# Patient Record
Sex: Female | Born: 1965 | Race: White | Hispanic: No | Marital: Married | Smoking: Never smoker
Health system: Southern US, Community
[De-identification: ages and names within clinical notes are randomized; demographics above are authoritative.]

## PROBLEM LIST (undated history)

## (undated) DIAGNOSIS — I10 Essential (primary) hypertension: Secondary | ICD-10-CM

## (undated) DIAGNOSIS — Z8619 Personal history of other infectious and parasitic diseases: Secondary | ICD-10-CM

## (undated) DIAGNOSIS — E119 Type 2 diabetes mellitus without complications: Secondary | ICD-10-CM

## (undated) DIAGNOSIS — M1990 Unspecified osteoarthritis, unspecified site: Secondary | ICD-10-CM

## (undated) DIAGNOSIS — F319 Bipolar disorder, unspecified: Secondary | ICD-10-CM

## (undated) DIAGNOSIS — E785 Hyperlipidemia, unspecified: Secondary | ICD-10-CM

## (undated) DIAGNOSIS — N183 Chronic kidney disease, stage 3 unspecified: Secondary | ICD-10-CM

## (undated) DIAGNOSIS — I2510 Atherosclerotic heart disease of native coronary artery without angina pectoris: Secondary | ICD-10-CM

## (undated) HISTORY — DX: Chronic kidney disease, stage 3 unspecified: N18.30

## (undated) HISTORY — DX: Personal history of other infectious and parasitic diseases: Z86.19

## (undated) HISTORY — DX: Essential (primary) hypertension: I10

## (undated) HISTORY — DX: Hyperlipidemia, unspecified: E78.5

## (undated) HISTORY — DX: Atherosclerotic heart disease of native coronary artery without angina pectoris: I25.10

---

## 2002-04-12 HISTORY — PX: OTHER SURGICAL HISTORY: SHX169

## 2006-04-12 HISTORY — PX: OTHER SURGICAL HISTORY: SHX169

## 2014-05-10 LAB — HEPATIC FUNCTION PANEL
ALT: 40 U/L — AB (ref 7–35)
AST: 37 U/L — AB (ref 13–35)

## 2014-05-10 LAB — BASIC METABOLIC PANEL
BUN: 5 mg/dL (ref 4–21)
Creatinine: 0.9 mg/dL (ref 0.5–1.1)
Glucose: 154 mg/dL
POTASSIUM: 3.7 mmol/L (ref 3.4–5.3)
Sodium: 138 mmol/L (ref 137–147)

## 2014-05-16 ENCOUNTER — Ambulatory Visit: Payer: Self-pay | Admitting: Family Medicine

## 2014-10-09 ENCOUNTER — Other Ambulatory Visit: Payer: Self-pay | Admitting: Family Medicine

## 2014-11-25 ENCOUNTER — Other Ambulatory Visit: Payer: Self-pay | Admitting: Family Medicine

## 2015-03-14 ENCOUNTER — Other Ambulatory Visit: Payer: Self-pay | Admitting: Family Medicine

## 2015-07-08 ENCOUNTER — Ambulatory Visit: Payer: Self-pay | Admitting: Family Medicine

## 2015-07-08 DIAGNOSIS — M109 Gout, unspecified: Secondary | ICD-10-CM | POA: Insufficient documentation

## 2015-07-08 DIAGNOSIS — R109 Unspecified abdominal pain: Secondary | ICD-10-CM

## 2015-07-08 DIAGNOSIS — M1990 Unspecified osteoarthritis, unspecified site: Secondary | ICD-10-CM | POA: Insufficient documentation

## 2015-07-08 DIAGNOSIS — F319 Bipolar disorder, unspecified: Secondary | ICD-10-CM | POA: Insufficient documentation

## 2015-07-08 DIAGNOSIS — M179 Osteoarthritis of knee, unspecified: Secondary | ICD-10-CM | POA: Insufficient documentation

## 2015-07-08 DIAGNOSIS — G8929 Other chronic pain: Secondary | ICD-10-CM | POA: Insufficient documentation

## 2015-07-08 DIAGNOSIS — M1710 Unilateral primary osteoarthritis, unspecified knee: Secondary | ICD-10-CM | POA: Insufficient documentation

## 2015-07-10 ENCOUNTER — Ambulatory Visit (INDEPENDENT_AMBULATORY_CARE_PROVIDER_SITE_OTHER): Payer: Self-pay | Admitting: Family Medicine

## 2015-07-10 ENCOUNTER — Encounter: Payer: Self-pay | Admitting: Family Medicine

## 2015-07-10 VITALS — BP 138/90 | HR 80 | Temp 98.1°F | Resp 16 | Ht 63.0 in | Wt 211.0 lb

## 2015-07-10 DIAGNOSIS — M1009 Idiopathic gout, multiple sites: Secondary | ICD-10-CM

## 2015-07-10 DIAGNOSIS — M109 Gout, unspecified: Secondary | ICD-10-CM

## 2015-07-10 DIAGNOSIS — R197 Diarrhea, unspecified: Secondary | ICD-10-CM

## 2015-07-10 DIAGNOSIS — F3178 Bipolar disorder, in full remission, most recent episode mixed: Secondary | ICD-10-CM

## 2015-07-10 DIAGNOSIS — R195 Other fecal abnormalities: Secondary | ICD-10-CM | POA: Insufficient documentation

## 2015-07-10 MED ORDER — DICYCLOMINE HCL 10 MG PO CAPS: 10.0000 mg | ORAL_CAPSULE | Freq: Three times a day (TID) | ORAL | Status: DC

## 2015-07-10 MED ORDER — LITHIUM CARBONATE 300 MG PO CAPS: 1200.0000 mg | ORAL_CAPSULE | Freq: Every day | ORAL | Status: DC

## 2015-07-10 MED ORDER — ALLOPURINOL 100 MG PO TABS: 100.0000 mg | ORAL_TABLET | Freq: Every day | ORAL | Status: DC

## 2015-07-10 NOTE — Progress Notes (Signed)
Patient: Kim Obrien Female    DOB: April 22, 1965   50 y.o.   MRN: [id number] Visit Date: 07/10/2015  Today's Provider: Lelon Huh, MD   Chief Complaint  Patient presents with  . Gout  . Osteoarthritis  . Manic Behavior  . Diarrhea   Subjective:    HPI    Gout: Patient here for evaluation of acute gouty arthritis. The patient reports attack every 2-3 months. Attacks occur primarily in the right foot. The patient is avoiding high purine foods and reports consuming no alcoholic beverages. Indomethacin works well.    Bipolar Disorder: Pt currently takes lithium carbonate 300 mg 3-4 capsules qhs, with relief.  Diarrhea: Patient complains of diarrhea. Symptoms have been present for approximately 2 years. The symptoms are unchanged. Stool frequency is approximately 4 per day. Patient estimates stool volume to be "not much". Diarrhea does not occur at night. The patient has noted occasional balck stools. The also patient reports the following symptoms: abdominal distension, fecal urgency, flatulence, loose stools, melena and small volume diarrhea. The patient denies the following symptoms: cramping unrelieved by defecation, fever and red blood with stools. The patient currently denies significant abdominal pain or discomfort.   Relationship to food: the diarrhea improves with fasting. Has tried cutting out dairy with no improvement. She did have abdominal ultrasound last year due to abdominal bloating, which was remarkable only for fatty liver.    No Known Allergies Previous Medications   ALLOPURINOL (ZYLOPRIM) 100 MG TABLET    Take 1 tablet by mouth daily.   DIPHENHYDRAMINE-ACETAMINOPHEN (TYLENOL PM EXTRA STRENGTH) 25-500 MG TABS TABLET    Take 1 tablet by mouth at bedtime as needed.   INDOMETHACIN (INDOCIN) 50 MG CAPSULE    TAKE ONE CAPSULE BY MOUTH EVERY 8 HOURS AS NEEDED   LITHIUM CARBONATE 300 MG CAPSULE    TAKE 3-4 CAPSULES ORAL AT BEDTIME   MULTIPLE VITAMINS-MINERALS (WOMENS  MULTIVITAMIN PLUS) TABS    Take 1 tablet by mouth daily.    Review of Systems  Constitutional: Positive for fatigue. Negative for fever, chills, diaphoresis, activity change, appetite change and unexpected weight change.  Respiratory: Negative for cough, shortness of breath and wheezing.   Cardiovascular: Negative for chest pain and palpitations.  Gastrointestinal: Positive for nausea, diarrhea and abdominal distention. Negative for abdominal pain.    Social History  Substance Use Topics  . Smoking status: Never Smoker   . Smokeless tobacco: Never Used  . Alcohol Use: No   Objective:   BP 138/90 mmHg  Pulse 80  Temp(Src) 98.1 F (36.7 C) (Oral)  Resp 16  Ht 5\' 3"  (1.6 m)  Wt 211 lb (95.709 kg)  BMI 37.39 kg/m2  LMP 06/26/2015  Physical Exam  General Appearance:    Alert, cooperative, no distress, obese  Eyes:    PERRL, conjunctiva/corneas clear, EOM's intact       Lungs:     Clear to auscultation bilaterally, respirations unlabored  Heart:    Regular rate and rhythm  Neurologic:   Awake, alert, oriented x 3. No apparent focal neurological           defect.   Psych:   Appropriate mood and affect.       Assessment & Plan:     1. Bipolar disorder, in full remission, most recent episode mixed ([place]) Symptomatically well controlled. - lithium carbonate 300 MG capsule; Take 4 capsules (1,200 mg total) by mouth at bedtime.  Dispense: 120 capsule; Refill:  3 - Lithium level - Renal function panel - TSH  2. Gouty arthritis May increase to 2 a day at her discretion since she is still having flare ups every few months.  - allopurinol (ZYLOPRIM) 100 MG tablet; Take 1-2 tablets (100-200 mg total) by mouth daily.  Dispense: 30 tablet; Refill: 12  3. Loose bowel movement Try gluten free diet. Suspect IBS. Try Bentyl.  - dicyclomine (BENTYL) 10 MG capsule; Take 1-2 capsules (10-20 mg total) by mouth 4 (four) times daily -  before meals and at bedtime.  Dispense: 120 capsule;  Refill: 3       Lelon Huh, MD  [facility]

## 2015-07-14 DIAGNOSIS — Z0000 Encounter for general adult medical examination without abnormal findings: Secondary | ICD-10-CM

## 2015-07-15 LAB — TSH: TSH: 2.22 u[IU]/mL (ref 0.450–4.500)

## 2015-07-15 LAB — RENAL FUNCTION PANEL
Albumin: 3.8 g/dL (ref 3.5–5.5)
BUN / CREAT RATIO: 6 — AB (ref 9–23)
BUN: 6 mg/dL (ref 6–24)
CHLORIDE: 109 mmol/L — AB (ref 96–106)
CO2: 18 mmol/L (ref 18–29)
Calcium: 9 mg/dL (ref 8.7–10.2)
Creatinine, Ser: 1.07 mg/dL — ABNORMAL HIGH (ref 0.57–1.00)
GFR, EST AFRICAN AMERICAN: 70 mL/min/{1.73_m2} (ref >59)
GFR, EST NON AFRICAN AMERICAN: 61 mL/min/{1.73_m2} (ref >59)
GLUCOSE: 132 mg/dL — AB (ref 65–99)
POTASSIUM: 4.1 mmol/L (ref 3.5–5.2)
Phosphorus: 2.6 mg/dL (ref 2.5–4.5)
Sodium: 143 mmol/L (ref 134–144)

## 2015-07-15 LAB — LITHIUM LEVEL: LITHIUM LVL: 1 mmol/L (ref 0.6–1.2)

## 2015-11-03 ENCOUNTER — Other Ambulatory Visit: Payer: Self-pay | Admitting: Family Medicine

## 2015-11-03 DIAGNOSIS — F3178 Bipolar disorder, in full remission, most recent episode mixed: Secondary | ICD-10-CM

## 2016-02-03 ENCOUNTER — Other Ambulatory Visit: Payer: Self-pay | Admitting: Family Medicine

## 2016-03-16 ENCOUNTER — Other Ambulatory Visit: Payer: Self-pay | Admitting: Family Medicine

## 2016-03-16 DIAGNOSIS — F3178 Bipolar disorder, in full remission, most recent episode mixed: Secondary | ICD-10-CM

## 2016-04-08 ENCOUNTER — Other Ambulatory Visit: Payer: Self-pay | Admitting: Family Medicine

## 2016-04-08 DIAGNOSIS — M109 Gout, unspecified: Secondary | ICD-10-CM

## 2016-09-03 ENCOUNTER — Encounter: Payer: Self-pay | Admitting: Physician Assistant

## 2016-09-03 ENCOUNTER — Ambulatory Visit (INDEPENDENT_AMBULATORY_CARE_PROVIDER_SITE_OTHER): Payer: Self-pay | Admitting: Physician Assistant

## 2016-09-03 VITALS — BP 122/88 | HR 84 | Temp 99.3°F | Resp 16 | Wt 207.0 lb

## 2016-09-03 DIAGNOSIS — R109 Unspecified abdominal pain: Secondary | ICD-10-CM

## 2016-09-03 DIAGNOSIS — R112 Nausea with vomiting, unspecified: Secondary | ICD-10-CM

## 2016-09-03 DIAGNOSIS — R140 Abdominal distension (gaseous): Secondary | ICD-10-CM

## 2016-09-03 DIAGNOSIS — Z0000 Encounter for general adult medical examination without abnormal findings: Secondary | ICD-10-CM

## 2016-09-03 DIAGNOSIS — F3178 Bipolar disorder, in full remission, most recent episode mixed: Secondary | ICD-10-CM

## 2016-09-03 MED ORDER — ONDANSETRON HCL 4 MG PO TABS: 4.0000 mg | ORAL_TABLET | Freq: Three times a day (TID) | ORAL | 0 refills | Status: DC | PRN

## 2016-09-03 NOTE — Progress Notes (Signed)
Patient: Kim Obrien Female    DOB: 1965/04/18   51 y.o.   MRN: [id number] Visit Date: 09/03/2016  Today's Provider: Trinna Post, PA-C   Chief Complaint  Patient presents with  . Emesis   Subjective:    Emesis   This is a new problem. Episode onset: x 5 days. Episode frequency: Occurs for 3-4 hours every night. The problem has been unchanged. The emesis has an appearance of stomach contents. Maximum temperature: pt does not believe her thermometer is accurate; her temperatures have been reading around 97.0. Temp in office is 99.3. Associated symptoms include arthralgias (baseline), diarrhea (some), dizziness (some) and headaches. Pertinent negatives include no abdominal pain, chest pain, chills, coughing, myalgias, sweats or weight loss. Treatments tried: Zofran. The treatment provided moderate relief.   Kim Obrien is a 51 y/o woman with history of Bipolar disorder on lithium, chronic diarrhea and abdominal bloating presenting today with vomiting at night. She doesn't vomit during the day. No bloody vomit or stools. No association with food she says. She eats fried rice and Chick fil A before bed. No alcohol use or new medications. Also has chronic abdominal bloating and diarrhea. Has been recommended to go to GI previously  But doesn't have insurance and so hasn't. She had an abdominal ultrasound last year which showed fatty liver but otherwise normal. She Was prescribed Bentyl but she felt it didn't work so she stopped taking it.  Also asking for refill on lithium.     No Known Allergies   Current Outpatient Prescriptions:  .  allopurinol (ZYLOPRIM) 100 MG tablet, TAKE 1 TABLET BY MOUTH DAILY, Disp: 30 tablet, Rfl: 5 .  diphenhydramine-acetaminophen (TYLENOL PM EXTRA STRENGTH) 25-500 MG TABS tablet, Take 1 tablet by mouth at bedtime as needed. , Disp: , Rfl:  .  indomethacin (INDOCIN) 50 MG capsule, TAKE ONE CAPSULE BY MOUTH EVERY 8 HOURS AS NEEDED, Disp: 30 capsule,  Rfl: 5 .  lithium carbonate 300 MG capsule, TAKE 4 CAPSULES (1,200 MG TOTAL) BY MOUTH AT BEDTIME., Disp: 120 capsule, Rfl: 5  Review of Systems  Constitutional: Negative for chills and weight loss.  Respiratory: Negative for cough.   Cardiovascular: Negative for chest pain.  Gastrointestinal: Positive for diarrhea (some) and vomiting. Negative for abdominal pain.  Musculoskeletal: Positive for arthralgias (baseline). Negative for myalgias.  Neurological: Positive for dizziness (some) and headaches.    Social History  Substance Use Topics  . Smoking status: Never Smoker  . Smokeless tobacco: Never Used  . Alcohol use No   Objective:   BP 122/88 (BP Location: Left Arm, Patient Position: Sitting, Cuff Size: Large)   Pulse 84   Temp 99.3 F (37.4 C) (Oral)   Resp 16   Wt 207 lb (93.9 kg)   LMP 08/22/2016   BMI 36.67 kg/m  Vitals:   09/03/16 0844  BP: 122/88  Pulse: 84  Resp: 16  Temp: 99.3 F (37.4 C)  TempSrc: Oral  Weight: 207 lb (93.9 kg)     Physical Exam  Constitutional: She is oriented to person, place, and time. She appears well-developed and well-nourished.  Cardiovascular: Normal rate and regular rhythm.   Pulmonary/Chest: Breath sounds normal. She is in respiratory distress.  Abdominal: Soft. Bowel sounds are normal. She exhibits distension. She exhibits no mass. There is no tenderness. There is no rebound and no guarding.  Neurological: She is alert and oriented to person, place, and time.  Skin: Skin is warm and  dry.  Psychiatric: Her behavior is normal. Her affect is blunt.        Assessment & Plan:     1. Nausea and vomiting, intractability of vomiting not specified, unspecified vomiting type  Possibly infectious superimposed onto acid reflux but will work up below. Not getting H pylori test 2/2 cost. Have instructed her to continue Zofran. She really needs to be seen by GI and we will make that referral when she has insurance.  - Comprehensive  metabolic panel - Lipase - Amylase - ondansetron (ZOFRAN) 4 MG tablet; Take 1 tablet (4 mg total) by mouth every 8 (eight) hours as needed for nausea or vomiting.  Dispense: 20 tablet; Refill: 0  2. Abdominal bloating  - Comprehensive metabolic panel - Lipase - Amylase  3. Abdominal pain, unspecified abdominal location  - Comprehensive metabolic panel - Lipase - Amylase  4. Bipolar disorder, in full remission, most recent episode mixed ([place])  - Lithium level  No Follow-up on file.  The entirety of the information documented in the History of Present Illness, Review of Systems and Physical Exam were personally obtained by me. Portions of this information were initially documented by Raquel Sarna D. and reviewed by me for thoroughness and accuracy.           Trinna Post, PA-C  [facility]

## 2016-09-03 NOTE — Patient Instructions (Signed)
Nausea and Vomiting, Adult Feeling sick to your stomach (nausea) means that your stomach is upset or you feel like you have to throw up (vomit). Feeling more and more sick to your stomach can lead to throwing up. Throwing up happens when food and liquid from your stomach are thrown up and out the mouth. Throwing up can make you feel weak and cause you to get dehydrated. Dehydration can make you tired and thirsty, make you have a dry mouth, and make it so you pee (urinate) less often. Older adults and people with other diseases or a weak defense system (immune system) are at higher risk for dehydration. If you feel sick to your stomach or if you throw up, it is important to follow instructions from your doctor about how to take care of yourself. Follow these instructions at home: Eating and drinking  Follow these instructions as told by your doctor:  Take an oral rehydration solution (ORS). This is a drink that is sold at pharmacies and stores.  Drink clear fluids in small amounts as you are able, such as:  Water.  Ice chips.  Diluted fruit juice.  Low-calorie sports drinks.  Eat bland, easy-to-digest foods in small amounts as you are able, such as:  Bananas.  Applesauce.  Rice.  Low-fat (lean) meats.  Toast.  Crackers.  Avoid fluids that have a lot of sugar or caffeine in them.  Avoid alcohol.  Avoid spicy or fatty foods. General instructions   Drink enough fluid to keep your pee (urine) clear or pale yellow.  Wash your hands often. If you cannot use soap and water, use hand sanitizer.  Make sure that all people in your home wash their hands well and often.  Take over-the-counter and prescription medicines only as told by your doctor.  Rest at home while you get better.  Watch your condition for any changes.  Breathe slowly and deeply when you feel sick to your stomach.  Keep all follow-up visits as told by your doctor. This is important. Contact a doctor  if:  You have a fever.  You cannot keep fluids down.  Your symptoms get worse.  You have new symptoms.  You feel sick to your stomach for more than two days.  You feel light-headed or dizzy.  You have a headache.  You have muscle cramps. Get help right away if:  You have pain in your chest, neck, arm, or jaw.  You feel very weak or you pass out (faint).  You throw up again and again.  You see blood in your throw-up.  Your throw-up looks like black coffee grounds.  You have bloody or black poop (stools) or poop that look like tar.  You have a very bad headache, a stiff neck, or both.  You have a rash.  You have very bad pain, cramping, or bloating in your belly (abdomen).  You have trouble breathing.  You are breathing very quickly.  Your heart is beating very quickly.  Your skin feels cold and clammy.  You feel confused.  You have pain when you pee.  You have signs of dehydration, such as:  Dark pee, hardly any pee, or no pee.  Cracked lips.  Dry mouth.  Sunken eyes.  Sleepiness.  Weakness. These symptoms may be an emergency. Do not wait to see if the symptoms will go away. Get medical help right away. Call your local emergency services (911 in the [place] Do not drive yourself to the hospital. This information   is not intended to replace advice given to you by your health care provider. Make sure you discuss any questions you have with your health care provider. Document Released: 09/15/2007 Document Revised: 10/17/2015 Document Reviewed: 12/03/2014 Elsevier Interactive Patient Education  2017 [employer]vier Inc[employer]  

## 2016-09-04 LAB — COMPREHENSIVE METABOLIC PANEL
ALT: 18 IU/L (ref 0–32)
AST: 17 IU/L (ref 0–40)
Albumin/Globulin Ratio: 1.4 (ref 1.2–2.2)
Albumin: 4 g/dL (ref 3.5–5.5)
Alkaline Phosphatase: 74 IU/L (ref 39–117)
BUN/Creatinine Ratio: 6 — ABNORMAL LOW (ref 9–23)
BUN: 7 mg/dL (ref 6–24)
Bilirubin Total: 0.3 mg/dL (ref 0.0–1.2)
CO2: 20 mmol/L (ref 18–29)
Calcium: 9.2 mg/dL (ref 8.7–10.2)
Chloride: 108 mmol/L — ABNORMAL HIGH (ref 96–106)
Creatinine, Ser: 1.14 mg/dL — ABNORMAL HIGH (ref 0.57–1.00)
GFR calc Af Amer: 65 mL/min/{1.73_m2} (ref >59)
GFR calc non Af Amer: 56 mL/min/{1.73_m2} — ABNORMAL LOW (ref >59)
Globulin, Total: 2.8 g/dL (ref 1.5–4.5)
Glucose: 120 mg/dL — ABNORMAL HIGH (ref 65–99)
Potassium: 3.8 mmol/L (ref 3.5–5.2)
Sodium: 140 mmol/L (ref 134–144)
Total Protein: 6.8 g/dL (ref 6.0–8.5)

## 2016-09-04 LAB — AMYLASE: Amylase: 58 U/L (ref 31–124)

## 2016-09-04 LAB — LIPASE: Lipase: 35 U/L (ref 14–72)

## 2016-09-04 LAB — LITHIUM LEVEL: Lithium Lvl: 0.8 mmol/L (ref 0.6–1.2)

## 2016-09-07 ENCOUNTER — Telehealth: Payer: Self-pay

## 2016-09-07 NOTE — Telephone Encounter (Signed)
Pt advised.  She does not want to proceed with the GI referral at this time, secondary to not having health insurance.  I advised her to call back when she is able to go forward with the referral.   Thanks,   -Mickel Baas

## 2016-09-07 NOTE — Telephone Encounter (Signed)
-----   Message from Trinna Post, [place] sent at 09/07/2016  8:34 AM EDT ----- Lipase and amylase normal. CMP shows some likely dehydration changes, a little high sugar. Really needs to see GI if this continues. Please let us know when to place the referral. Lithium level normal and we can refill as request comes in. Is she feeling any better today?

## 2016-09-07 NOTE — Telephone Encounter (Signed)
Noted, thank you

## 2016-09-07 NOTE — Telephone Encounter (Signed)
LMTCB 09/07/2016  Thanks,   -Mickel Baas

## 2016-09-13 ENCOUNTER — Other Ambulatory Visit: Payer: Self-pay | Admitting: Family Medicine

## 2016-09-13 DIAGNOSIS — F3178 Bipolar disorder, in full remission, most recent episode mixed: Secondary | ICD-10-CM

## 2016-10-25 ENCOUNTER — Other Ambulatory Visit: Payer: Self-pay | Admitting: Family Medicine

## 2016-10-25 DIAGNOSIS — M109 Gout, unspecified: Secondary | ICD-10-CM

## 2017-02-13 ENCOUNTER — Other Ambulatory Visit: Payer: Self-pay | Admitting: Family Medicine

## 2017-02-13 DIAGNOSIS — M109 Gout, unspecified: Secondary | ICD-10-CM

## 2017-03-14 ENCOUNTER — Other Ambulatory Visit: Payer: Self-pay | Admitting: Family Medicine

## 2017-03-14 DIAGNOSIS — F3178 Bipolar disorder, in full remission, most recent episode mixed: Secondary | ICD-10-CM

## 2017-05-31 ENCOUNTER — Encounter: Payer: Self-pay | Admitting: Family Medicine

## 2017-05-31 ENCOUNTER — Ambulatory Visit (INDEPENDENT_AMBULATORY_CARE_PROVIDER_SITE_OTHER): Payer: Self-pay | Admitting: Family Medicine

## 2017-05-31 VITALS — BP 130/84 | HR 77 | Temp 98.2°F | Resp 16 | Wt 217.0 lb

## 2017-05-31 DIAGNOSIS — S161XXS Strain of muscle, fascia and tendon at neck level, sequela: Secondary | ICD-10-CM

## 2017-05-31 MED ORDER — CYCLOBENZAPRINE HCL 5 MG PO TABS: 5.0000 mg | ORAL_TABLET | Freq: Three times a day (TID) | ORAL | 1 refills | Status: DC | PRN

## 2017-05-31 NOTE — Progress Notes (Signed)
Patient: Kim Obrien Female    DOB: 1965-04-18   52 y.o.   MRN: [id number] Visit Date: 05/31/2017  Today's Provider: Lelon Huh, MD   Chief Complaint  Patient presents with  . Neck Pain   Subjective:    Patient has had right side neck pain for 3 weeks. Patient states she did not injure herself, she just woke-up one morning with her neck hurting. Patient describes pain as shooting. Patient states yesterday the pain in her neck moved down into her right shoulder. Patient states pain in shoulder is sharp. Patient has been taking otc ibuprofen, Aleve, Bengay cream, Deep Blue cream and cyclobenzaprine with mild relief.     Neck Pain   This is a new problem. The current episode started 1 to 4 weeks ago (3 weeks). The problem occurs constantly. The problem has been gradually worsening. The pain is associated with an unknown factor. The pain is present in the right side. The quality of the pain is described as shooting. The pain is at a severity of 8/10. The pain is moderate. The symptoms are aggravated by sneezing, coughing, position and twisting. The pain is same all the time. Stiffness is present all day. Pertinent negatives include no chest pain, fever, headaches, leg pain, numbness, pain with swallowing, paresis, photophobia, syncope, tingling, trouble swallowing, visual change, weakness or weight loss. She has tried NSAIDs (ibuprofen, Aleve and muscle relaxer) for the symptoms. The treatment provided mild relief.       No Known Allergies   Current Outpatient Medications:  .  allopurinol (ZYLOPRIM) 100 MG tablet, TAKE 1 TABLET BY MOUTH DAILY, Disp: 30 tablet, Rfl: 5 .  diphenhydramine-acetaminophen (TYLENOL PM EXTRA STRENGTH) 25-500 MG TABS tablet, Take 1 tablet by mouth at bedtime as needed. , Disp: , Rfl:  .  indomethacin (INDOCIN) 50 MG capsule, TAKE ONE CAPSULE BY MOUTH EVERY 8 HOURS AS NEEDED, Disp: 30 capsule, Rfl: 5 .  lithium carbonate 300 MG capsule, TAKE 4 CAPSULES  (1,200 MG TOTAL) BY MOUTH AT BEDTIME., Disp: 120 capsule, Rfl: 5 .  ondansetron (ZOFRAN) 4 MG tablet, Take 1 tablet (4 mg total) by mouth every 8 (eight) hours as needed for nausea or vomiting., Disp: 20 tablet, Rfl: 0  Review of Systems  Constitutional: Negative for appetite change, chills, fatigue, fever and weight loss.  HENT: Negative for trouble swallowing.   Eyes: Negative for photophobia.  Respiratory: Negative for chest tightness and shortness of breath.   Cardiovascular: Negative for chest pain, palpitations and syncope.  Gastrointestinal: Negative for abdominal pain, nausea and vomiting.  Musculoskeletal: Positive for neck pain.  Neurological: Negative for dizziness, tingling, weakness, numbness and headaches.    Social History   Tobacco Use  . Smoking status: Never Smoker  . Smokeless tobacco: Never Used  Substance Use Topics  . Alcohol use: No    Alcohol/week: 0.0 oz   Objective:   BP 130/84 (BP Location: Right Arm, Patient Position: Sitting, Cuff Size: Large)   Pulse 77   Temp 98.2 F (36.8 C) (Oral)   Resp 16   Wt 217 lb (98.4 kg)   SpO2 99%   BMI 38.44 kg/m  Vitals:   05/31/17 1204  BP: 130/84  Pulse: 77  Resp: 16  Temp: 98.2 F (36.8 C)  TempSrc: Oral  SpO2: 99%  Weight: 217 lb (98.4 kg)     Physical Exam   General Appearance:    Alert, cooperative, no distress  Eyes:  PERRL, conjunctiva/corneas clear, EOM's intact       Lungs:     Clear to auscultation bilaterally, respirations unlabored  Heart:    Regular rate and rhythm  MS:   Mild tenderness and tightness of right upper trapezius. No gross deformities.           Assessment & Plan:     1. Strain of neck muscle, sequela Continue application of heat pads  - cyclobenzaprine (FLEXERIL) 5 MG tablet; Take 1-2 tablets (5-10 mg total) by mouth 3 (three) times daily as needed for muscle spasms.  Dispense: 30 tablet; Refill: 1       Lelon Huh, MD  [facility]

## 2017-08-14 ENCOUNTER — Other Ambulatory Visit: Payer: Self-pay | Admitting: Family Medicine

## 2017-08-14 DIAGNOSIS — M109 Gout, unspecified: Secondary | ICD-10-CM

## 2017-09-11 ENCOUNTER — Other Ambulatory Visit: Payer: Self-pay | Admitting: Family Medicine

## 2017-09-11 DIAGNOSIS — F3178 Bipolar disorder, in full remission, most recent episode mixed: Secondary | ICD-10-CM

## 2017-11-04 ENCOUNTER — Encounter
Admission: EM | Disposition: A | Discharge status: Home / Self Care | Payer: Self-pay | Source: Home / Self Care | Attending: Emergency Medicine

## 2017-11-04 ENCOUNTER — Emergency Department: Payer: Self-pay | Admitting: Anesthesiology

## 2017-11-04 ENCOUNTER — Encounter: Payer: Self-pay | Admitting: Emergency Medicine

## 2017-11-04 ENCOUNTER — Observation Stay
Admission: EM | Admit: 2017-11-04 | Discharge: 2017-11-05 | Disposition: A | Discharge status: Home / Self Care | Payer: Self-pay | Attending: Obstetrics and Gynecology | Admitting: Obstetrics and Gynecology

## 2017-11-04 ENCOUNTER — Other Ambulatory Visit: Payer: Self-pay

## 2017-11-04 ENCOUNTER — Emergency Department: Payer: Self-pay

## 2017-11-04 DIAGNOSIS — I10 Essential (primary) hypertension: Secondary | ICD-10-CM | POA: Insufficient documentation

## 2017-11-04 DIAGNOSIS — N838 Other noninflammatory disorders of ovary, fallopian tube and broad ligament: Secondary | ICD-10-CM | POA: Diagnosis present

## 2017-11-04 DIAGNOSIS — Z818 Family history of other mental and behavioral disorders: Secondary | ICD-10-CM | POA: Insufficient documentation

## 2017-11-04 DIAGNOSIS — N83519 Torsion of ovary and ovarian pedicle, unspecified side: Secondary | ICD-10-CM

## 2017-11-04 DIAGNOSIS — Z8042 Family history of malignant neoplasm of prostate: Secondary | ICD-10-CM | POA: Insufficient documentation

## 2017-11-04 DIAGNOSIS — D271 Benign neoplasm of left ovary: Principal | ICD-10-CM | POA: Insufficient documentation

## 2017-11-04 DIAGNOSIS — Z8249 Family history of ischemic heart disease and other diseases of the circulatory system: Secondary | ICD-10-CM | POA: Insufficient documentation

## 2017-11-04 DIAGNOSIS — F319 Bipolar disorder, unspecified: Secondary | ICD-10-CM | POA: Insufficient documentation

## 2017-11-04 DIAGNOSIS — N83512 Torsion of left ovary and ovarian pedicle: Secondary | ICD-10-CM | POA: Insufficient documentation

## 2017-11-04 DIAGNOSIS — Z8349 Family history of other endocrine, nutritional and metabolic diseases: Secondary | ICD-10-CM | POA: Insufficient documentation

## 2017-11-04 DIAGNOSIS — Z833 Family history of diabetes mellitus: Secondary | ICD-10-CM | POA: Insufficient documentation

## 2017-11-04 DIAGNOSIS — M1990 Unspecified osteoarthritis, unspecified site: Secondary | ICD-10-CM | POA: Insufficient documentation

## 2017-11-04 HISTORY — PX: LAPAROSCOPY: SHX197

## 2017-11-04 HISTORY — DX: Unspecified osteoarthritis, unspecified site: M19.90

## 2017-11-04 HISTORY — DX: Bipolar disorder, unspecified: F31.9

## 2017-11-04 LAB — COMPREHENSIVE METABOLIC PANEL
ALBUMIN: 4.2 g/dL (ref 3.5–5.0)
ALT: 30 U/L (ref 0–44)
AST: 39 U/L (ref 15–41)
Alkaline Phosphatase: 82 U/L (ref 38–126)
Anion gap: 7 (ref 5–15)
BUN: 8 mg/dL (ref 6–20)
CHLORIDE: 112 mmol/L — AB (ref 98–111)
CO2: 23 mmol/L (ref 22–32)
Calcium: 9.5 mg/dL (ref 8.9–10.3)
Creatinine, Ser: 1.39 mg/dL — ABNORMAL HIGH (ref 0.44–1.00)
GFR calc Af Amer: 50 mL/min — ABNORMAL LOW (ref >60)
GFR calc non Af Amer: 43 mL/min — ABNORMAL LOW (ref >60)
GLUCOSE: 139 mg/dL — AB (ref 70–99)
POTASSIUM: 2.9 mmol/L — AB (ref 3.5–5.1)
Sodium: 142 mmol/L (ref 135–145)
Total Bilirubin: 0.7 mg/dL (ref 0.3–1.2)
Total Protein: 7.2 g/dL (ref 6.5–8.1)

## 2017-11-04 LAB — LITHIUM LEVEL: LITHIUM LVL: 0.99 mmol/L (ref 0.60–1.20)

## 2017-11-04 LAB — CBC
HEMATOCRIT: 37.4 % (ref 35.0–47.0)
Hemoglobin: 12.8 g/dL (ref 12.0–16.0)
MCH: 29.9 pg (ref 26.0–34.0)
MCHC: 34.3 g/dL (ref 32.0–36.0)
MCV: 87.2 fL (ref 80.0–100.0)
Platelets: 319 10*3/uL (ref 150–440)
RBC: 4.29 MIL/uL (ref 3.80–5.20)
RDW: 15 % — ABNORMAL HIGH (ref 11.5–14.5)
WBC: 9.5 10*3/uL (ref 3.6–11.0)

## 2017-11-04 LAB — URINALYSIS, COMPLETE (UACMP) WITH MICROSCOPIC
BACTERIA UA: NONE SEEN
Bilirubin Urine: NEGATIVE
Glucose, UA: NEGATIVE mg/dL
Hgb urine dipstick: NEGATIVE
Ketones, ur: NEGATIVE mg/dL
Leukocytes, UA: NEGATIVE
Nitrite: NEGATIVE
Protein, ur: NEGATIVE mg/dL
SPECIFIC GRAVITY, URINE: 1.004 — AB (ref 1.005–1.030)
pH: 7 (ref 5.0–8.0)

## 2017-11-04 LAB — POCT PREGNANCY, URINE: Preg Test, Ur: NEGATIVE

## 2017-11-04 LAB — MAGNESIUM: MAGNESIUM: 2.1 mg/dL (ref 1.7–2.4)

## 2017-11-04 SURGERY — LAPAROSCOPY, DIAGNOSTIC
Anesthesia: General | Site: Abdomen | Laterality: Left | Wound class: Clean Contaminated

## 2017-11-04 MED ORDER — MIDAZOLAM HCL 2 MG/2ML IJ SOLN
INTRAMUSCULAR | Status: DC | PRN
  Administered 2017-11-04: 2 mg via INTRAVENOUS

## 2017-11-04 MED ORDER — MIDAZOLAM HCL 2 MG/2ML IJ SOLN
INTRAMUSCULAR | Status: AC
  Filled 2017-11-04: qty 2

## 2017-11-04 MED ORDER — BUPIVACAINE HCL (PF) 0.5 % IJ SOLN
INTRAMUSCULAR | Status: AC
Start: 2017-11-04 — End: ?
  Filled 2017-11-04: qty 30

## 2017-11-04 MED ORDER — SUCCINYLCHOLINE CHLORIDE 20 MG/ML IJ SOLN
INTRAMUSCULAR | Status: DC | PRN
  Administered 2017-11-04: 140 mg via INTRAVENOUS

## 2017-11-04 MED ORDER — ROCURONIUM BROMIDE 100 MG/10ML IV SOLN
INTRAVENOUS | Status: AC
  Filled 2017-11-04: qty 1

## 2017-11-04 MED ORDER — PROPOFOL 10 MG/ML IV BOLUS
INTRAVENOUS | Status: AC
  Filled 2017-11-04: qty 20

## 2017-11-04 MED ORDER — FENTANYL CITRATE (PF) 100 MCG/2ML IJ SOLN
50.0000 ug | INTRAMUSCULAR | Status: AC | PRN
  Administered 2017-11-04 (×2): 50 ug via INTRAVENOUS
  Filled 2017-11-04: qty 2

## 2017-11-04 MED ORDER — MORPHINE SULFATE (PF) 4 MG/ML IV SOLN
8.0000 mg | Freq: Once | INTRAVENOUS | Status: AC
  Administered 2017-11-04: 8 mg via INTRAVENOUS
  Filled 2017-11-04: qty 2

## 2017-11-04 MED ORDER — POTASSIUM CHLORIDE 10 MEQ/100ML IV SOLN
10.0000 meq | INTRAVENOUS | Status: DC
  Administered 2017-11-04: 10 meq via INTRAVENOUS
  Filled 2017-11-04 (×4): qty 100

## 2017-11-04 MED ORDER — LIDOCAINE HCL (CARDIAC) PF 100 MG/5ML IV SOSY
PREFILLED_SYRINGE | INTRAVENOUS | Status: DC | PRN
  Administered 2017-11-04: 30 mg via INTRAVENOUS

## 2017-11-04 MED ORDER — ONDANSETRON HCL 4 MG/2ML IJ SOLN
4.0000 mg | Freq: Once | INTRAMUSCULAR | Status: AC
  Administered 2017-11-04: 4 mg via INTRAVENOUS
  Filled 2017-11-04: qty 2

## 2017-11-04 MED ORDER — ROCURONIUM BROMIDE 100 MG/10ML IV SOLN
INTRAVENOUS | Status: DC | PRN
  Administered 2017-11-04: 5 mg via INTRAVENOUS
  Administered 2017-11-04: 30 mg via INTRAVENOUS

## 2017-11-04 MED ORDER — LACTATED RINGERS IV SOLN
INTRAVENOUS | Status: DC
  Administered 2017-11-04: via INTRAVENOUS

## 2017-11-04 MED ORDER — KETOROLAC TROMETHAMINE 30 MG/ML IJ SOLN
15.0000 mg | Freq: Once | INTRAMUSCULAR | Status: AC
  Administered 2017-11-04: 15 mg via INTRAVENOUS
  Filled 2017-11-04: qty 1

## 2017-11-04 MED ORDER — PROPOFOL 10 MG/ML IV BOLUS
INTRAVENOUS | Status: DC | PRN
  Administered 2017-11-04: 150 mg via INTRAVENOUS

## 2017-11-04 MED ORDER — FENTANYL CITRATE (PF) 100 MCG/2ML IJ SOLN
INTRAMUSCULAR | Status: DC | PRN
  Administered 2017-11-04 – 2017-11-05 (×4): 50 ug via INTRAVENOUS

## 2017-11-04 MED ORDER — FENTANYL CITRATE (PF) 100 MCG/2ML IJ SOLN
INTRAMUSCULAR | Status: AC
  Filled 2017-11-04: qty 2

## 2017-11-04 MED ORDER — LIDOCAINE HCL (PF) 2 % IJ SOLN
INTRAMUSCULAR | Status: AC
  Filled 2017-11-04: qty 10

## 2017-11-04 MED ORDER — SODIUM CHLORIDE 0.9 % IV BOLUS
1000.0000 mL | Freq: Once | INTRAVENOUS | Status: AC
Start: 2017-11-04 — End: 2017-11-04
  Administered 2017-11-04: 1000 mL via INTRAVENOUS

## 2017-11-04 MED ORDER — FENTANYL CITRATE (PF) 100 MCG/2ML IJ SOLN: 25.0000 ug | INTRAMUSCULAR | Status: DC | PRN

## 2017-11-04 MED ORDER — SUCCINYLCHOLINE CHLORIDE 20 MG/ML IJ SOLN
INTRAMUSCULAR | Status: AC
  Filled 2017-11-04: qty 1

## 2017-11-04 MED ORDER — HALOPERIDOL LACTATE 5 MG/ML IJ SOLN
2.5000 mg | Freq: Once | INTRAMUSCULAR | Status: AC
  Administered 2017-11-04: 2.5 mg via INTRAVENOUS
  Filled 2017-11-04: qty 1

## 2017-11-04 MED ORDER — ONDANSETRON HCL 4 MG/2ML IJ SOLN
4.0000 mg | Freq: Once | INTRAMUSCULAR | Status: AC | PRN
  Administered 2017-11-05: 4 mg via INTRAVENOUS

## 2017-11-04 SURGICAL SUPPLY — 56 items
ANCHOR TIS RET SYS 1550ML (BAG) ×2 IMPLANT
BAG URINE DRAINAGE (UROLOGICAL SUPPLIES) ×2 IMPLANT
BLADE SURG SZ11 CARB STEEL (BLADE) ×2 IMPLANT
CANISTER SUCT 1200ML W/VALVE (MISCELLANEOUS) ×2 IMPLANT
CANISTER SUCT 3000ML (MISCELLANEOUS) ×2 IMPLANT
CATH FOLEY 2WAY  5CC 16FR (CATHETERS) ×1
CATH URTH 16FR FL 2W BLN LF (CATHETERS) ×1 IMPLANT
CHLORAPREP W/TINT 26ML (MISCELLANEOUS) ×2 IMPLANT
DERMABOND ADVANCED (GAUZE/BANDAGES/DRESSINGS) ×1
DERMABOND ADVANCED .7 DNX12 (GAUZE/BANDAGES/DRESSINGS) ×1 IMPLANT
DRAPE LEGGINS SURG 28X43 STRL (DRAPES) ×2 IMPLANT
DRAPE SHEET LG 3/4 BI-LAMINATE (DRAPES) ×2 IMPLANT
DRAPE UNDER BUTTOCK W/FLU (DRAPES) ×2 IMPLANT
ELECT REM PT RETURN 9FT ADLT (ELECTROSURGICAL) ×2
ELECTRODE REM PT RTRN 9FT ADLT (ELECTROSURGICAL) ×1 IMPLANT
GLOVE BIO SURGEON STRL SZ7 (GLOVE) ×8 IMPLANT
GLOVE BIOGEL PI IND STRL 7.5 (GLOVE) ×3 IMPLANT
GLOVE BIOGEL PI INDICATOR 7.5 (GLOVE) ×3
GOWN STRL REUS W/ TWL LRG LVL3 (GOWN DISPOSABLE) ×2 IMPLANT
GOWN STRL REUS W/TWL LRG LVL3 (GOWN DISPOSABLE) ×2
HANDLE YANKAUER SUCT BULB TIP (MISCELLANEOUS) ×2 IMPLANT
IRRIGATION STRYKERFLOW (MISCELLANEOUS) ×1 IMPLANT
IRRIGATOR STRYKERFLOW (MISCELLANEOUS) ×2
IV LACTATED RINGERS 1000ML (IV SOLUTION) ×2 IMPLANT
KIT PINK PAD W/HEAD ARE REST (MISCELLANEOUS) ×2
KIT PINK PAD W/HEAD ARM REST (MISCELLANEOUS) ×1 IMPLANT
KIT TURNOVER CYSTO (KITS) ×2 IMPLANT
LABEL OR SOLS (LABEL) IMPLANT
LIGASURE VESSEL 5MM BLUNT TIP (ELECTROSURGICAL) ×2 IMPLANT
MANIPULATOR UTERINE 4.5 ZUMI (MISCELLANEOUS) IMPLANT
NEEDLE HYPO 22GX1.5 SAFETY (NEEDLE) ×2 IMPLANT
NS IRRIG 500ML POUR BTL (IV SOLUTION) ×2 IMPLANT
PACK LAP CHOLECYSTECTOMY (MISCELLANEOUS) ×2 IMPLANT
PAD OB MATERNITY 4.3X12.25 (PERSONAL CARE ITEMS) ×2 IMPLANT
PAD PREP 24X41 OB/GYN DISP (PERSONAL CARE ITEMS) ×2 IMPLANT
SCISSORS METZENBAUM CVD 33 (INSTRUMENTS) IMPLANT
SHEARS HARMONIC ACE PLUS 36CM (ENDOMECHANICALS) IMPLANT
SLEEVE ENDOPATH XCEL 5M (ENDOMECHANICALS) ×2 IMPLANT
SOL PREP PVP 2OZ (MISCELLANEOUS) ×2
SOLUTION PREP PVP 2OZ (MISCELLANEOUS) ×1 IMPLANT
SURGILUBE 2OZ TUBE FLIPTOP (MISCELLANEOUS) ×2 IMPLANT
SUT MNCRL 4-0 (SUTURE)
SUT MNCRL 4-0 27XMFL (SUTURE)
SUT VIC AB 2-0 UR6 27 (SUTURE) IMPLANT
SUT VIC AB 3-0 SH 27 (SUTURE) ×1
SUT VIC AB 3-0 SH 27X BRD (SUTURE) ×1 IMPLANT
SUT VIC AB 4-0 SH 27 (SUTURE) ×1
SUT VIC AB 4-0 SH 27XANBCTRL (SUTURE) ×1 IMPLANT
SUT VICRYL 0 AB UR-6 (SUTURE) ×2 IMPLANT
SUTURE MNCRL 4-0 27XMF (SUTURE) IMPLANT
TROCAR BLADELESS 15MM (ENDOMECHANICALS) ×2 IMPLANT
TROCAR ENDO BLADELESS 11MM (ENDOMECHANICALS) ×2 IMPLANT
TROCAR XCEL NON-BLD 5MMX100MML (ENDOMECHANICALS) ×2 IMPLANT
TROCAR XCEL UNIV SLVE 11M 100M (ENDOMECHANICALS) IMPLANT
TUBING INSUFFLATION (TUBING) ×2 IMPLANT
TUBING SCD EXTENSION 9995 GYN (TUBING) ×2 IMPLANT

## 2017-11-04 NOTE — ED Notes (Signed)
Report called to Joelene Millin, RN in the OR and pt updated with plan of care. Informed consent obtained by Surgeon and this RN witnessed consent. VSS. Pt husband and daughter at side.

## 2017-11-04 NOTE — H&P (View-Only) (Signed)
GYNECOLOGY CONSULT NOTE  GYN Consultation  Attending Provider: Darel Hong, MD   Kim Obrien [id number] 11/04/2017 10:30 PM    Reason for Consultation:   Kim Obrien is a 52 y.o. G2P2 perimenopausal female seen at the request of Dr. Mable Paris for evaluation of left ovarian mass and acute, severe abdominal pain.    History of Present Ilness:   The patient awoke today to initially mid-lower back pain that then moved to her left lower abdomen and left side.  The pain was generally constant and sharp, but would wax more painful to a pain score of 10/10.  The pain did not radiate.  Nothing seemed to make the pain better apart from medication in the ER.  Movement made the pain worse.  She associates intermittent nausea and emesis with her pain.  She has had no food since 1pm and quickly had emesis after.  She has had an increase in abdominal girth and bloating over the past few years. She denies constipation and early satiety.  She denies weight loss.  She is still menstruating infrequently. Her LMP was 2 months ago.  Her menses have spaced out quite a bit.  She does not remember the last time she had a pap smear, but remembers that it was normal.     Past Medical History:  Diagnosis Date  . Arthritis   . Bipolar 1 disorder ([place])   . History of chicken pox    Past Surgical History:  Procedure Laterality Date  . pap smear  2008   in [place]  . partial wisdom tooth extraction  2004   Allergies: No Known Allergies   Prior to Admission medications   Medication Sig Start Date End Date Taking? Authorizing Provider  lithium carbonate 300 MG capsule TAKE 4 CAPSULES (1,200 MG TOTAL) BY MOUTH AT BEDTIME. 09/11/17  Yes Fisher, Kirstie Peri, MD    Obstetric History: She is a G68P2 female s/p SVD x 2.    Social History:  She  reports that she has never smoked. She has never used smokeless tobacco. She reports that she does not drink alcohol or use drugs.  Family History:  family history includes  Anxiety disorder in her mother and sister; Bipolar disorder in her father; Depression in her mother; Diabetes in her mother; Gout in her father; Heart disease in her mother; Hypertension in her father; Prostate cancer in her father.   Review of Systems:   Review of Systems  Constitutional: Negative.  Negative for malaise/fatigue and weight loss.  HENT: Negative.   Eyes: Negative.   Respiratory: Negative.   Cardiovascular: Negative.  Negative for chest pain, palpitations and leg swelling.  Gastrointestinal: Positive for abdominal pain, diarrhea, nausea and vomiting. Negative for blood in stool, constipation, heartburn and melena.  Genitourinary: Positive for flank pain. Negative for dysuria, frequency, hematuria and urgency.  Skin: Negative.   Neurological: Negative.   Endo/Heme/Allergies: Negative.   Psychiatric/Behavioral: Negative.      Objective    BP (!) 166/103 (BP Location: Left Arm)   Pulse 71   Temp 98.9 F (37.2 C) (Oral)   Resp 16   Ht 5\' 3"  (1.6 m)   Wt 200 lb (90.7 kg)   LMP 09/04/2017 (Approximate)   SpO2 100%   BMI 35.43 kg/m  Physical Exam  Physical Exam  Constitutional: She is oriented to person, place, and time. She appears well-developed and well-nourished. No distress.  HENT:  Head: Normocephalic and atraumatic.  Eyes: Conjunctivae are normal. No  scleral icterus.  Neck: Normal range of motion. Neck supple.  Cardiovascular: Normal rate and regular rhythm. Exam reveals no gallop and no friction rub.  No murmur heard. Pulmonary/Chest: Effort normal and breath sounds normal. No respiratory distress. She has no wheezes. She has no rales.  Abdominal: Soft. Bowel sounds are normal. She exhibits distension and mass. There is tenderness. There is no rebound and no guarding.  Large abdominal mass occupying the majority of her abdominal exam.  It is moderately tender to palpation, especially in her LLQ.  Musculoskeletal: Normal range of motion. She exhibits no edema.   Neurological: She is alert and oriented to person, place, and time. No cranial nerve deficit.  Skin: Skin is warm and dry. No erythema.  Psychiatric: She has a normal mood and affect. Her behavior is normal. Judgment normal.     Laboratory Results:   Lab Results  Component Value Date   WBC 9.5 11/04/2017   RBC 4.29 11/04/2017   HGB 12.8 11/04/2017   HCT 37.4 11/04/2017   PLT 319 11/04/2017   NA 142 11/04/2017   K 2.9 (L) 11/04/2017   CREATININE 1.39 (H) 11/04/2017   Lab Results  Component Value Date   MG 2.1 11/04/2017   LITHIUM 0.99 11/04/2017    Lab Results  Component Value Date   PREGTESTUR NEGATIVE 11/04/2017    Imaging Results:  Ct Renal Stone Study  Result Date: 11/04/2017 CLINICAL DATA:  Left-sided flank pain EXAM: CT ABDOMEN AND PELVIS WITHOUT CONTRAST TECHNIQUE: Multidetector CT imaging of the abdomen and pelvis was performed following the standard protocol without IV contrast. COMPARISON:  None. FINDINGS: Lower chest: Lung bases demonstrate no acute consolidation or pleural effusion. Normal heart size. Hepatobiliary: No focal liver abnormality is seen. No gallstones, gallbladder wall thickening, or biliary dilatation. Pancreas: Unremarkable. No pancreatic ductal dilatation or surrounding inflammatory changes. Spleen: Normal in size without focal abnormality. Adrenals/Urinary Tract: Adrenal glands are within normal limits. Prominent left extrarenal pelvis. No hydroureter. No ureteral stone. The bladder is nearly empty . Stomach/Bowel: Stomach is within normal limits. Appendix appears normal. No evidence of bowel wall thickening, distention, or inflammatory changes. Vascular/Lymphatic: No significant vascular findings are present. No enlarged abdominal or pelvic lymph nodes. Reproductive: The uterus is unremarkable. Large cystic mass extending from the pelvis to the mid abdomen, this measures 20 cm transverse by 16 cm AP by 17 cm craniocaudad. This is demonstrated to arise  from the left ovary on the coronal views. There are in cord left adnexal vessels. There is swirling appearance of the adnexa on the coronal views, concerning for torsion. Other: Negative for free air or free fluid. Musculoskeletal: No acute or significant osseous findings. IMPRESSION: 1. Large cystic mass in the abdomen and pelvis measuring 20 x 16 x 17 cm, arising from the left ovary and concerning for a cystic neoplasm. There is swirled appearance of the adnexa on the coronal images and findings are concerning for ovarian torsion on the left side associated with the large mass. 2. Negative for hydronephrosis or ureteral stone. Critical Value/emergent results were called by telephone at the time of interpretation on 11/04/2017 at 9:26 pm to Dr. Darel Hong , who verbally acknowledged these results. Electronically Signed   By: Donavan Foil M.D.   On: 11/04/2017 21:27      Assessment & Plan   Kim Obrien is a 52 y.o. G2P2 perimenopausal female with a large left ovarian mass and likely ovarian torsion.  Problems: 1. Left ovarian neoplasm 2.  Left ovarian torsion 3. Hypokalemia 4. Acute kidney failure  Plan 1. Left ovarian neoplasm, left ovarian torsion: Discussed findings on CT scan with patient. I am concerned that she has ovarian torsion, which appears likely based on patient history and CT findings.  We discussed the possibility of malignancy with this mass.  The size alone is not by itself a primary factor of concern in malignancy. On CT, she does not appear to have omental caking, pelvic or periaortic lymphadenopathy, or ascites.  The mass is well circumscribed. I did obtain a baseline CA-125, as a precaution pre-operatively.  She understands that this is possibly an ovarian malignancy and that the optimal surgery would be to perform this surgery in a controlled environment with a TLH/BSO, staging procedure.  She understands that if she does have a malignancy, further surgery may be required.   She agrees to surgery. To OR for operative laparoscopy, left salpingo-oophorectomy, removal of left ovarian mass, possible right salpingo-oophorectomy (if appears abnormal or if increased suspicion for malignancy), possible laparotomy.    2. Hypokalemia: normal magnesium level.  Will replete.  Not symptomatic. EKG being performed and reviewed in preparation for surgery.  3. Acute Kidney injury: Etiology unclear. Will hydrate and follow improvement.  Because she is on lithium, I had a lithium level checked and it was normal.    Prentice Docker, MD 11/04/2017 10:30 PM

## 2017-11-04 NOTE — ED Notes (Signed)
Patient transported to CT 

## 2017-11-04 NOTE — Consult Note (Signed)
GYNECOLOGY CONSULT NOTE  GYN Consultation  Attending Provider: Darel Hong, MD   Kim Obrien [id number] 11/04/2017 10:30 PM    Reason for Consultation:   Kim Obrien is a 52 y.o. G2P2 perimenopausal female seen at the request of Dr. Mable Paris for evaluation of left ovarian mass and acute, severe abdominal pain.    History of Present Ilness:   The patient awoke today to initially mid-lower back pain that then moved to her left lower abdomen and left side.  The pain was generally constant and sharp, but would wax more painful to a pain score of 10/10.  The pain did not radiate.  Nothing seemed to make the pain better apart from medication in the ER.  Movement made the pain worse.  She associates intermittent nausea and emesis with her pain.  She has had no food since 1pm and quickly had emesis after.  She has had an increase in abdominal girth and bloating over the past few years. She denies constipation and early satiety.  She denies weight loss.  She is still menstruating infrequently. Her LMP was 2 months ago.  Her menses have spaced out quite a bit.  She does not remember the last time she had a pap smear, but remembers that it was normal.     Past Medical History:  Diagnosis Date  . Arthritis   . Bipolar 1 disorder ([place])   . History of chicken pox    Past Surgical History:  Procedure Laterality Date  . pap smear  2008   in [place]  . partial wisdom tooth extraction  2004   Allergies: No Known Allergies   Prior to Admission medications   Medication Sig Start Date End Date Taking? Authorizing Provider  lithium carbonate 300 MG capsule TAKE 4 CAPSULES (1,200 MG TOTAL) BY MOUTH AT BEDTIME. 09/11/17  Yes Fisher, Kirstie Peri, MD    Obstetric History: She is a G32P2 female s/p SVD x 2.    Social History:  She  reports that she has never smoked. She has never used smokeless tobacco. She reports that she does not drink alcohol or use drugs.  Family History:  family history includes  Anxiety disorder in her mother and sister; Bipolar disorder in her father; Depression in her mother; Diabetes in her mother; Gout in her father; Heart disease in her mother; Hypertension in her father; Prostate cancer in her father.   Review of Systems:   Review of Systems  Constitutional: Negative.  Negative for malaise/fatigue and weight loss.  HENT: Negative.   Eyes: Negative.   Respiratory: Negative.   Cardiovascular: Negative.  Negative for chest pain, palpitations and leg swelling.  Gastrointestinal: Positive for abdominal pain, diarrhea, nausea and vomiting. Negative for blood in stool, constipation, heartburn and melena.  Genitourinary: Positive for flank pain. Negative for dysuria, frequency, hematuria and urgency.  Skin: Negative.   Neurological: Negative.   Endo/Heme/Allergies: Negative.   Psychiatric/Behavioral: Negative.      Objective    BP (!) 166/103 (BP Location: Left Arm)   Pulse 71   Temp 98.9 F (37.2 C) (Oral)   Resp 16   Ht 5\' 3"  (1.6 m)   Wt 200 lb (90.7 kg)   LMP 09/04/2017 (Approximate)   SpO2 100%   BMI 35.43 kg/m  Physical Exam  Physical Exam  Constitutional: She is oriented to person, place, and time. She appears well-developed and well-nourished. No distress.  HENT:  Head: Normocephalic and atraumatic.  Eyes: Conjunctivae are normal. No  scleral icterus.  Neck: Normal range of motion. Neck supple.  Cardiovascular: Normal rate and regular rhythm. Exam reveals no gallop and no friction rub.  No murmur heard. Pulmonary/Chest: Effort normal and breath sounds normal. No respiratory distress. She has no wheezes. She has no rales.  Abdominal: Soft. Bowel sounds are normal. She exhibits distension and mass. There is tenderness. There is no rebound and no guarding.  Large abdominal mass occupying the majority of her abdominal exam.  It is moderately tender to palpation, especially in her LLQ.  Musculoskeletal: Normal range of motion. She exhibits no edema.   Neurological: She is alert and oriented to person, place, and time. No cranial nerve deficit.  Skin: Skin is warm and dry. No erythema.  Psychiatric: She has a normal mood and affect. Her behavior is normal. Judgment normal.     Laboratory Results:   Lab Results  Component Value Date   WBC 9.5 11/04/2017   RBC 4.29 11/04/2017   HGB 12.8 11/04/2017   HCT 37.4 11/04/2017   PLT 319 11/04/2017   NA 142 11/04/2017   K 2.9 (L) 11/04/2017   CREATININE 1.39 (H) 11/04/2017   Lab Results  Component Value Date   MG 2.1 11/04/2017   LITHIUM 0.99 11/04/2017    Lab Results  Component Value Date   PREGTESTUR NEGATIVE 11/04/2017    Imaging Results:  Ct Renal Stone Study  Result Date: 11/04/2017 CLINICAL DATA:  Left-sided flank pain EXAM: CT ABDOMEN AND PELVIS WITHOUT CONTRAST TECHNIQUE: Multidetector CT imaging of the abdomen and pelvis was performed following the standard protocol without IV contrast. COMPARISON:  None. FINDINGS: Lower chest: Lung bases demonstrate no acute consolidation or pleural effusion. Normal heart size. Hepatobiliary: No focal liver abnormality is seen. No gallstones, gallbladder wall thickening, or biliary dilatation. Pancreas: Unremarkable. No pancreatic ductal dilatation or surrounding inflammatory changes. Spleen: Normal in size without focal abnormality. Adrenals/Urinary Tract: Adrenal glands are within normal limits. Prominent left extrarenal pelvis. No hydroureter. No ureteral stone. The bladder is nearly empty . Stomach/Bowel: Stomach is within normal limits. Appendix appears normal. No evidence of bowel wall thickening, distention, or inflammatory changes. Vascular/Lymphatic: No significant vascular findings are present. No enlarged abdominal or pelvic lymph nodes. Reproductive: The uterus is unremarkable. Large cystic mass extending from the pelvis to the mid abdomen, this measures 20 cm transverse by 16 cm AP by 17 cm craniocaudad. This is demonstrated to arise  from the left ovary on the coronal views. There are in cord left adnexal vessels. There is swirling appearance of the adnexa on the coronal views, concerning for torsion. Other: Negative for free air or free fluid. Musculoskeletal: No acute or significant osseous findings. IMPRESSION: 1. Large cystic mass in the abdomen and pelvis measuring 20 x 16 x 17 cm, arising from the left ovary and concerning for a cystic neoplasm. There is swirled appearance of the adnexa on the coronal images and findings are concerning for ovarian torsion on the left side associated with the large mass. 2. Negative for hydronephrosis or ureteral stone. Critical Value/emergent results were called by telephone at the time of interpretation on 11/04/2017 at 9:26 pm to Dr. Darel Hong , who verbally acknowledged these results. Electronically Signed   By: Donavan Foil M.D.   On: 11/04/2017 21:27      Assessment & Plan   Kim Obrien is a 52 y.o. G2P2 perimenopausal female with a large left ovarian mass and likely ovarian torsion.  Problems: 1. Left ovarian neoplasm 2.  Left ovarian torsion 3. Hypokalemia 4. Acute kidney failure  Plan 1. Left ovarian neoplasm, left ovarian torsion: Discussed findings on CT scan with patient. I am concerned that she has ovarian torsion, which appears likely based on patient history and CT findings.  We discussed the possibility of malignancy with this mass.  The size alone is not by itself a primary factor of concern in malignancy. On CT, she does not appear to have omental caking, pelvic or periaortic lymphadenopathy, or ascites.  The mass is well circumscribed. I did obtain a baseline CA-125, as a precaution pre-operatively.  She understands that this is possibly an ovarian malignancy and that the optimal surgery would be to perform this surgery in a controlled environment with a TLH/BSO, staging procedure.  She understands that if she does have a malignancy, further surgery may be required.   She agrees to surgery. To OR for operative laparoscopy, left salpingo-oophorectomy, removal of left ovarian mass, possible right salpingo-oophorectomy (if appears abnormal or if increased suspicion for malignancy), possible laparotomy.    2. Hypokalemia: normal magnesium level.  Will replete.  Not symptomatic. EKG being performed and reviewed in preparation for surgery.  3. Acute Kidney injury: Etiology unclear. Will hydrate and follow improvement.  Because she is on lithium, I had a lithium level checked and it was normal.    Prentice Docker, MD 11/04/2017 10:30 PM

## 2017-11-04 NOTE — ED Triage Notes (Signed)
L flank pain began this am, now moved down L lower abdomen.

## 2017-11-04 NOTE — Anesthesia Preprocedure Evaluation (Addendum)
Anesthesia Evaluation  Patient identified by MRN, date of birth, ID band Patient awake    Reviewed: Allergy & Precautions, NPO status , Patient's Chart, lab work & pertinent test results  Airway Mallampati: III  TM Distance: >3 FB     Dental  (+) Upper Dentures   Pulmonary neg pulmonary ROS,    Pulmonary exam normal        Cardiovascular hypertension, negative cardio ROS Normal cardiovascular exam     Neuro/Psych PSYCHIATRIC DISORDERS Bipolar Disorder negative neurological ROS     GI/Hepatic negative GI ROS, Neg liver ROS,   Endo/Other  negative endocrine ROS  Renal/GU negative Renal ROS  Female GU complaint     Musculoskeletal  (+) Arthritis ,   Abdominal Normal abdominal exam  (+)   Peds negative pediatric ROS (+)  Hematology negative hematology ROS (+)   Anesthesia Other Findings   Reproductive/Obstetrics                            Anesthesia Physical Anesthesia Plan  ASA: II and emergent  Anesthesia Plan: General   Post-op Pain Management:    Induction: Intravenous, Rapid sequence and Cricoid pressure planned  PONV Risk Score and Plan:   Airway Management Planned: Oral ETT  Additional Equipment:   Intra-op Plan:   Post-operative Plan: Extubation in OR  Informed Consent: I have reviewed the patients History and Physical, chart, labs and discussed the procedure including the risks, benefits and alternatives for the proposed anesthesia with the patient or authorized representative who has indicated his/her understanding and acceptance.   Dental advisory given  Plan Discussed with: CRNA and Surgeon  Anesthesia Plan Comments:         Anesthesia Quick Evaluation

## 2017-11-04 NOTE — ED Notes (Signed)
Consulting md here to see pt.

## 2017-11-04 NOTE — Interval H&P Note (Signed)
History and Physical Interval Note:  11/04/2017 10:50 PM  Kim Obrien  has presented today for surgery, with the diagnosis of left ovarian torsion and left ovarian mass.   The various methods of treatment have been discussed with the patient and family. After consideration of risks, benefits and other options for treatment, the patient has consented to "operative laparoscopy, left salpingo-oophorectomy, removal of left ovarian mass, possible right salpingo-oophorectomy (if appears abnormal or if increased suspicion for malignancy), possible laparotomy" as a surgical intervention.  The patient's history has been reviewed, patient examined, no change in status, stable for surgery.  I have reviewed the patient's chart and labs.  Questions were answered to the patient's satisfaction.    Prentice Docker, MD, [facility], [facility] 11/04/2017 10:51 PM

## 2017-11-04 NOTE — ED Provider Notes (Signed)
[facility] Emergency Department Provider Note  ____________________________________________   First MD Initiated Contact with Patient 11/04/17 2036     (approximate)  I have reviewed the triage vital signs and the nursing notes.   HISTORY  Chief Complaint Flank Pain   HPI Kim Obrien is a 52 y.o. female is self presents to the emergency department with sudden onset severe left flank pain radiating down towards her left lower abdomen that began earlier today.  Symptoms are severe sharp throbbing and aching.  They are completely constant.  Moving seems to make it somewhat worse but nothing particular makes it better.  She takes lithium for depression otherwise no other medications.  She denies history of abdominal surgeries.  No dysuria frequency or hesitancy.  No history of renal colic.  She is postmenopausal.   Positive for nausea and vomiting.   Past Medical History:  Diagnosis Date  . Arthritis   . Bipolar 1 disorder ([place])   . History of chicken pox     Patient Active Problem List   Diagnosis Date Noted  . Ovarian mass, left 11/05/2017  . Ovarian torsion 11/05/2017  . Torsion of left ovary and ovarian pedicle 11/05/2017  . Loose bowel movement 07/10/2015  . Arthritis 07/08/2015  . Essential hypertension 07/08/2015  . Bipolar affective disorder ([place]) 07/08/2015  . Gouty arthritis 07/08/2015  . Osteoarthritis of knee 07/08/2015  . Chronic abdominal pain 07/08/2015    Past Surgical History:  Procedure Laterality Date  . pap smear  2008   in [place]  . partial wisdom tooth extraction  2004    Prior to Admission medications   Medication Sig Start Date End Date Taking? Authorizing Provider  lithium carbonate 300 MG capsule TAKE 4 CAPSULES (1,200 MG TOTAL) BY MOUTH AT BEDTIME. 09/11/17  Yes Birdie Sons, MD  allopurinol (ZYLOPRIM) 100 MG tablet TAKE 1 TABLET BY MOUTH DAILY Patient not taking: Reported on 11/04/2017 08/14/17   Birdie Sons, MD  cyclobenzaprine (FLEXERIL) 5 MG tablet Take 1-2 tablets (5-10 mg total) by mouth 3 (three) times daily as needed for muscle spasms. Patient not taking: Reported on 11/04/2017 05/31/17   Birdie Sons, MD  indomethacin (INDOCIN) 50 MG capsule TAKE ONE CAPSULE BY MOUTH EVERY 8 HOURS AS NEEDED Patient not taking: Reported on 11/04/2017 02/04/16   Birdie Sons, MD  ondansetron (ZOFRAN) 4 MG tablet Take 1 tablet (4 mg total) by mouth every 8 (eight) hours as needed for nausea or vomiting. Patient not taking: Reported on 11/04/2017 09/03/16   Trinna Post, PA-C  promethazine (PHENERGAN) 25 MG tablet Take 1 tablet (25 mg total) by mouth every 6 (six) hours as needed for nausea or vomiting. One hour before procedure 11/05/17   Gae Dry, MD  traMADol (ULTRAM) 50 MG tablet Take 1 tablet (50 mg total) by mouth every 6 (six) hours as needed (breakthrough pain). 11/05/17   Gae Dry, MD    Allergies Patient has no known allergies.  Family History  Problem Relation Age of Onset  . Diabetes Mother        type 1  . Heart disease Mother   . Depression Mother   . Anxiety disorder Mother   . Prostate cancer Father   . Bipolar disorder Father   . Gout Father   . Hypertension Father   . Anxiety disorder Sister     Social History Social History   Tobacco Use  . Smoking status: Never  Smoker  . Smokeless tobacco: Never Used  Substance Use Topics  . Alcohol use: No    Alcohol/week: 0.0 oz  . Drug use: No    Review of Systems Constitutional: No fever/chills Eyes: No visual changes. ENT: No sore throat. Cardiovascular: Denies chest pain. Respiratory: Denies shortness of breath. Gastrointestinal: Positive for abdominal pain.  Positive for nausea, positive for vomiting.  No diarrhea.  No constipation. Genitourinary: Negative for dysuria. Musculoskeletal: Positive for back pain. Skin: Negative for rash. Neurological: Negative for headaches, focal weakness or  numbness.   ____________________________________________   PHYSICAL EXAM:  VITAL SIGNS: ED Triage Vitals  Enc Vitals Group     BP 11/04/17 1904 (!) 155/84     Pulse Rate 11/04/17 1904 84     Resp 11/04/17 1904 18     Temp 11/04/17 1904 98.9 F (37.2 C)     Temp Source 11/04/17 1904 Oral     SpO2 11/04/17 1904 100 %     Weight 11/04/17 1906 200 lb (90.7 kg)     Height 11/04/17 1906 5\' 3"  (1.6 m)     Head Circumference --      Peak Flow --      Pain Score 11/04/17 1906 10     Pain Loc --      Pain Edu? --      Excl. in [place]? --     Constitutional: Alert and oriented x4 sitting up in bed crying and holding a vomit bag Eyes: PERRL EOMI. Head: Atraumatic. Nose: No congestion/rhinnorhea. Mouth/Throat: No trismus Neck: No stridor.   Cardiovascular: Normal rate, regular rhythm. Grossly normal heart sounds.  Good peripheral circulation. Respiratory: Normal respiratory effort.  No retractions. Lungs CTAB and moving good air Gastrointestinal: Soft abdomen somewhat distended and diffusely tender with no focality no costovertebral tenderness Musculoskeletal: No lower extremity edema   Neurologic:  Normal speech and language. No gross focal neurologic deficits are appreciated. Skin:  Skin is warm, dry and intact. No rash noted. Psychiatric: Mood and affect are normal. Speech and behavior are normal.    ____________________________________________   DIFFERENTIAL includes but not limited to  Pyelonephritis, nephrolithiasis, AAA, ovarian torsion ____________________________________________   LABS (all labs ordered are listed, but only abnormal results are displayed)  Labs Reviewed  COMPREHENSIVE METABOLIC PANEL - Abnormal; Notable for the following components:      Result Value   Potassium 2.9 (*)    Chloride 112 (*)    Glucose, Bld 139 (*)    Creatinine, Ser 1.39 (*)    GFR calc non Af Amer 43 (*)    GFR calc Af Amer 50 (*)    All other components within normal limits   CBC - Abnormal; Notable for the following components:   RDW 15.0 (*)    All other components within normal limits  URINALYSIS, COMPLETE (UACMP) WITH MICROSCOPIC - Abnormal; Notable for the following components:   Color, Urine STRAW (*)    APPearance CLEAR (*)    Specific Gravity, Urine 1.004 (*)    All other components within normal limits  CBC - Abnormal; Notable for the following components:   WBC 11.8 (*)    RDW 15.6 (*)    All other components within normal limits  BASIC METABOLIC PANEL - Abnormal; Notable for the following components:   Sodium 147 (*)    Chloride 118 (*)    CO2 21 (*)    Glucose, Bld 224 (*)    Creatinine, Ser 1.36 (*)  GFR calc non Af Amer 44 (*)    GFR calc Af Amer 51 (*)    All other components within normal limits  LITHIUM LEVEL  MAGNESIUM  CA 125  POC URINE PREG, ED  POCT PREGNANCY, URINE  SURGICAL PATHOLOGY    Lab work reviewed by me shows hypokalemia likely secondary to pain and stress __________________________________________  EKG  ED ECG REPORT I, Darel Hong, the attending physician, personally viewed and interpreted this ECG.  Date: 11/06/2017 EKG Time:  Rate: 81 Rhythm: normal sinus rhythm QRS Axis: normal Intervals: normal ST/T Wave abnormalities: normal Narrative Interpretation: no evidence of acute ischemia  ____________________________________________  RADIOLOGY  CT scan stone protocol reviewed by me concerning for ovarian malignancy with ovarian torsion ____________________________________________   PROCEDURES  Procedure(s) performed: no  .Critical Care Performed by: Darel Hong, MD Authorized by: Darel Hong, MD   Critical care provider statement:    Critical care time (minutes):  30   Critical care time was exclusive of:  Separately billable procedures and treating other patients   Critical care was necessary to treat or prevent imminent or life-threatening deterioration of the following  conditions: Abdominal catastrophe.   Critical care was time spent personally by me on the following activities:  Development of treatment plan with patient or surrogate, discussions with consultants, evaluation of patient's response to treatment, examination of patient, obtaining history from patient or surrogate, ordering and performing treatments and interventions, ordering and review of laboratory studies, ordering and review of radiographic studies, pulse oximetry, re-evaluation of patient's condition and review of old charts    Critical Care performed: Yes  ____________________________________________   INITIAL IMPRESSION / ASSESSMENT AND PLAN / ED COURSE  Pertinent labs & imaging results that were available during my care of the patient were reviewed by me and considered in my medical decision making (see chart for details).   As part of my medical decision making, I reviewed the following data within the [facility] History obtained from family if available, nursing notes, old chart and ekg, as well as notes from prior ED visits.  The patient arrives extremely uncomfortable appearing retching and vomiting.  Given IV fentanyl and IV haloperidol for pain and nausea.  She is uncomfortable in bed holding her left side with left flank pain which is concerning for new onset kidney stone.  She will require a CT scan with stone protocol.  The patient's pain is only minimally improved prior to CT I gave her a dose of Toradol with some improvement in her symptoms.  Unfortunately her CT scan is concerning for ovarian neoplasm with ovarian torsion.  She is formerly a patient of [facility] so I will reach out to Dr. Glennon Mac now.  Dr. Glennon Mac came and evaluated the patient and he recommends inpatient admission for urgent operative repair.  I have given her now 8 more milligrams of IV morphine which seems to have helped her symptoms.  I disclosed the diagnosis to the patient and her  husband who verbalized understanding and agreement.      ____________________________________________   FINAL CLINICAL IMPRESSION(S) / ED DIAGNOSES  Final diagnoses:  Ovarian torsion      NEW MEDICATIONS STARTED DURING THIS VISIT:  Discharge Medication List as of 11/05/2017  3:10 PM    START taking these medications   Details  promethazine (PHENERGAN) 25 MG tablet Take 1 tablet (25 mg total) by mouth every 6 (six) hours as needed for nausea or vomiting. One hour before procedure,  Starting Sat 11/05/2017, Normal    traMADol (ULTRAM) 50 MG tablet Take 1 tablet (50 mg total) by mouth every 6 (six) hours as needed (breakthrough pain)., Starting Sat 11/05/2017, Normal         Note:  This document was prepared using Dragon voice recognition software and may include unintentional dictation errors.     Darel Hong, MD 11/06/17 1301

## 2017-11-05 DIAGNOSIS — N83519 Torsion of ovary and ovarian pedicle, unspecified side: Secondary | ICD-10-CM

## 2017-11-05 DIAGNOSIS — N83512 Torsion of left ovary and ovarian pedicle: Secondary | ICD-10-CM | POA: Diagnosis present

## 2017-11-05 DIAGNOSIS — N838 Other noninflammatory disorders of ovary, fallopian tube and broad ligament: Secondary | ICD-10-CM | POA: Diagnosis present

## 2017-11-05 DIAGNOSIS — N83292 Other ovarian cyst, left side: Secondary | ICD-10-CM

## 2017-11-05 LAB — BASIC METABOLIC PANEL
ANION GAP: 8 (ref 5–15)
BUN: 7 mg/dL (ref 6–20)
CALCIUM: 9.5 mg/dL (ref 8.9–10.3)
CO2: 21 mmol/L — AB (ref 22–32)
Chloride: 118 mmol/L — ABNORMAL HIGH (ref 98–111)
Creatinine, Ser: 1.36 mg/dL — ABNORMAL HIGH (ref 0.44–1.00)
GFR calc non Af Amer: 44 mL/min — ABNORMAL LOW (ref >60)
GFR, EST AFRICAN AMERICAN: 51 mL/min — AB (ref >60)
Glucose, Bld: 224 mg/dL — ABNORMAL HIGH (ref 70–99)
POTASSIUM: 4.2 mmol/L (ref 3.5–5.1)
Sodium: 147 mmol/L — ABNORMAL HIGH (ref 135–145)

## 2017-11-05 LAB — CBC
HCT: 37.1 % (ref 35.0–47.0)
Hemoglobin: 12.2 g/dL (ref 12.0–16.0)
MCH: 29.7 pg (ref 26.0–34.0)
MCHC: 32.8 g/dL (ref 32.0–36.0)
MCV: 90.4 fL (ref 80.0–100.0)
Platelets: 299 10*3/uL (ref 150–440)
RBC: 4.1 MIL/uL (ref 3.80–5.20)
RDW: 15.6 % — ABNORMAL HIGH (ref 11.5–14.5)
WBC: 11.8 10*3/uL — ABNORMAL HIGH (ref 3.6–11.0)

## 2017-11-05 MED ORDER — ACETAMINOPHEN 10 MG/ML IV SOLN
INTRAVENOUS | Status: AC
  Filled 2017-11-05: qty 100

## 2017-11-05 MED ORDER — PROMETHAZINE HCL 25 MG PO TABS: 25.0000 mg | ORAL_TABLET | Freq: Four times a day (QID) | ORAL | 0 refills | Status: DC | PRN

## 2017-11-05 MED ORDER — ACETAMINOPHEN 500 MG PO TABS: 1000.0000 mg | ORAL_TABLET | Freq: Four times a day (QID) | ORAL | Status: DC | PRN

## 2017-11-05 MED ORDER — SIMETHICONE 80 MG PO CHEW: 80.0000 mg | CHEWABLE_TABLET | Freq: Four times a day (QID) | ORAL | Status: DC | PRN

## 2017-11-05 MED ORDER — LACTATED RINGERS IV SOLN
INTRAVENOUS | Status: DC | PRN
  Administered 2017-11-04 – 2017-11-05 (×3): via INTRAVENOUS

## 2017-11-05 MED ORDER — POTASSIUM CHLORIDE 2 MEQ/ML IV SOLN
INTRAVENOUS | Status: DC
  Administered 2017-11-05: 05:00:00 via INTRAVENOUS
  Filled 2017-11-05: qty 1000

## 2017-11-05 MED ORDER — TRAMADOL HCL 50 MG PO TABS
50.0000 mg | ORAL_TABLET | Freq: Four times a day (QID) | ORAL | Status: DC | PRN
Start: 2017-11-05 — End: 2017-11-05

## 2017-11-05 MED ORDER — ACETAMINOPHEN 10 MG/ML IV SOLN
INTRAVENOUS | Status: DC | PRN
  Administered 2017-11-05: 1000 mg via INTRAVENOUS

## 2017-11-05 MED ORDER — POTASSIUM CHLORIDE 10 MEQ/100ML IV SOLN
10.0000 meq | INTRAVENOUS | Status: AC
  Administered 2017-11-05 (×2): 10 meq via INTRAVENOUS
  Filled 2017-11-05 (×3): qty 100

## 2017-11-05 MED ORDER — DEXAMETHASONE SODIUM PHOSPHATE 10 MG/ML IJ SOLN
INTRAMUSCULAR | Status: DC | PRN
  Administered 2017-11-05: 5 mg via INTRAVENOUS

## 2017-11-05 MED ORDER — POTASSIUM CHLORIDE CRYS ER 20 MEQ PO TBCR
20.0000 meq | EXTENDED_RELEASE_TABLET | Freq: Two times a day (BID) | ORAL | Status: DC
  Administered 2017-11-05: 20 meq via ORAL
  Filled 2017-11-05: qty 1

## 2017-11-05 MED ORDER — TRAMADOL HCL 50 MG PO TABS: 50.0000 mg | ORAL_TABLET | Freq: Four times a day (QID) | ORAL | 0 refills | Status: DC | PRN

## 2017-11-05 MED ORDER — ONDANSETRON HCL 4 MG/2ML IJ SOLN
INTRAMUSCULAR | Status: AC
  Administered 2017-11-05: 4 mg via INTRAVENOUS
  Filled 2017-11-05: qty 2

## 2017-11-05 MED ORDER — ONDANSETRON HCL 4 MG PO TABS
4.0000 mg | ORAL_TABLET | Freq: Four times a day (QID) | ORAL | Status: DC | PRN
Start: 2017-11-05 — End: 2017-11-05

## 2017-11-05 MED ORDER — MENTHOL 3 MG MT LOZG
1.0000 | LOZENGE | OROMUCOSAL | Status: DC | PRN
  Filled 2017-11-05: qty 9

## 2017-11-05 MED ORDER — SODIUM CHLORIDE 0.9 % IJ SOLN
INTRAMUSCULAR | Status: AC
  Filled 2017-11-05: qty 10

## 2017-11-05 MED ORDER — BUPIVACAINE HCL 0.5 % IJ SOLN
INTRAMUSCULAR | Status: DC | PRN
  Administered 2017-11-04: 10 mL

## 2017-11-05 MED ORDER — MORPHINE SULFATE (PF) 4 MG/ML IV SOLN: 4.0000 mg | INTRAVENOUS | Status: DC | PRN

## 2017-11-05 MED ORDER — HYDROMORPHONE HCL 1 MG/ML IJ SOLN
1.0000 mg | INTRAMUSCULAR | Status: DC | PRN
  Administered 2017-11-05: 1 mg via INTRAVENOUS
  Filled 2017-11-05: qty 1

## 2017-11-05 MED ORDER — ONDANSETRON HCL 4 MG/2ML IJ SOLN
4.0000 mg | Freq: Once | INTRAMUSCULAR | Status: AC | PRN
  Administered 2017-11-05: 4 mg via INTRAVENOUS

## 2017-11-05 MED ORDER — ONDANSETRON HCL 4 MG/2ML IJ SOLN: 4.0000 mg | Freq: Four times a day (QID) | INTRAMUSCULAR | Status: DC | PRN

## 2017-11-05 MED ORDER — KETOROLAC TROMETHAMINE 30 MG/ML IJ SOLN
15.0000 mg | Freq: Once | INTRAMUSCULAR | Status: AC
  Administered 2017-11-05: 15 mg via INTRAVENOUS
  Filled 2017-11-05: qty 1

## 2017-11-05 MED ORDER — DOCUSATE SODIUM 100 MG PO CAPS: 100.0000 mg | ORAL_CAPSULE | Freq: Two times a day (BID) | ORAL | Status: DC

## 2017-11-05 MED ORDER — DEXAMETHASONE SODIUM PHOSPHATE 10 MG/ML IJ SOLN
INTRAMUSCULAR | Status: AC
  Filled 2017-11-05: qty 1

## 2017-11-05 MED ORDER — LITHIUM CARBONATE 300 MG PO CAPS
1200.0000 mg | ORAL_CAPSULE | Freq: Every day | ORAL | Status: DC
  Filled 2017-11-05: qty 4

## 2017-11-05 MED ORDER — FENTANYL CITRATE (PF) 100 MCG/2ML IJ SOLN: 25.0000 ug | INTRAMUSCULAR | Status: DC | PRN

## 2017-11-05 MED ORDER — KETOROLAC TROMETHAMINE 30 MG/ML IJ SOLN: INTRAMUSCULAR | Status: DC | PRN

## 2017-11-05 MED ORDER — ONDANSETRON HCL 4 MG/2ML IJ SOLN
INTRAMUSCULAR | Status: AC
  Filled 2017-11-05: qty 2

## 2017-11-05 MED ORDER — SUGAMMADEX SODIUM 200 MG/2ML IV SOLN
INTRAVENOUS | Status: DC | PRN
  Administered 2017-11-05: 181.4 mg via INTRAVENOUS

## 2017-11-05 MED ORDER — IBUPROFEN 600 MG PO TABS: 600.0000 mg | ORAL_TABLET | Freq: Four times a day (QID) | ORAL | Status: DC | PRN

## 2017-11-05 MED ORDER — PROMETHAZINE HCL 25 MG/ML IJ SOLN
12.5000 mg | INTRAMUSCULAR | Status: DC | PRN
  Administered 2017-11-05 (×2): 12.5 mg via INTRAVENOUS
  Filled 2017-11-05 (×2): qty 1

## 2017-11-05 NOTE — Progress Notes (Signed)
Patient tolerating PO fluids. Changed to regular diet for lunch and ate grits and toast for lunch. No complaints of nausea or pain. Patient voiding appropriately. Patient ready for discharge.

## 2017-11-05 NOTE — Progress Notes (Addendum)
Discharge instructions complete. Patient verbalizes understanding of teaching. Patient discharged home via wheelchair at 1540.

## 2017-11-05 NOTE — Anesthesia Post-op Follow-up Note (Signed)
Anesthesia QCDR form completed.        

## 2017-11-05 NOTE — Discharge Summary (Signed)
Gynecology Physician Postoperative Discharge Summary  Patient ID: Kim Obrien MRN: [id number] DOB/AGE: 01-05-1966 52 y.o.  Admit Date: 11/04/2017 Discharge Date: 11/05/2017  Preoperative Diagnoses: Left Ovarian cyst, Left Lower Quadrant Pain, Hypokalemia  Procedures: Procedure(s) (LRB): operative laparoscopy, aspiration of left ovarian cyst, left salpingo-oopherectomy (Left)  Significant Labs: CBC Latest Ref Rng & Units 11/05/2017 11/04/2017  WBC 3.6 - 11.0 K/uL 11.8(H) 9.5  Hemoglobin 12.0 - 16.0 g/dL 12.2 12.8  Hematocrit 35.0 - 47.0 % 37.1 37.4  Platelets 150 - 440 K/uL 299 319    Hospital Course:  Kim Obrien is a 52 y.o. G2P2  admitted for urgent surgery related to pain and cyst.  She underwent the procedures as mentioned above, her operation was uncomplicated. For further details about surgery, please refer to the operative report. Patient had an uncomplicated postoperative course. By time of discharge on POD#0 (after staying a while for potassium replenishment and treatment of nausea), her pain was controlled on oral pain medications; she was ambulating, voiding without difficulty, tolerating regular diet and passing flatus. She was deemed stable for discharge to home.   Discharge Exam: Blood pressure 122/70, pulse 84, temperature 98 F (36.7 C), temperature source Oral, resp. rate 18, height 5\' 3"  (1.6 m), weight 200 lb (90.7 kg), last menstrual period 09/04/2017, SpO2 99 %. General appearance: alert and no distress  Resp: clear to auscultation bilaterally  Cardio: regular rate and rhythm  GI: soft, non-tender; bowel sounds normal; no masses, no organomegaly.  Incision: C/D/I, no erythema, no drainage noted Pelvic: scant blood on pad  Extremities: extremities normal, atraumatic, no cyanosis or edema and Homans sign is negative, no sign of DVT  Discharged Condition: Stable  Disposition: Discharge disposition: 01-Home or Self Care       Discharge Instructions    Call MD for:  persistant nausea and vomiting   Complete by:  As directed    Call MD for:  redness, tenderness, or signs of infection (pain, swelling, redness, odor or green/yellow discharge around incision site)   Complete by:  As directed    Call MD for:  severe uncontrolled pain   Complete by:  As directed    Call MD for:  temperature >100.4   Complete by:  As directed    Change dressing (specify)   Complete by:  As directed    Dressing change: remove any dressings tomorrow   Diet general   Complete by:  As directed    Discharge instructions   Complete by:  As directed    Resume activities according to discharge instruction sheets   Increase activity slowly   Complete by:  As directed      Allergies as of 11/05/2017   No Known Allergies     Medication List    TAKE these medications   allopurinol 100 MG tablet Commonly known as:  ZYLOPRIM TAKE 1 TABLET BY MOUTH DAILY   cyclobenzaprine 5 MG tablet Commonly known as:  FLEXERIL Take 1-2 tablets (5-10 mg total) by mouth 3 (three) times daily as needed for muscle spasms.   indomethacin 50 MG capsule Commonly known as:  INDOCIN TAKE ONE CAPSULE BY MOUTH EVERY 8 HOURS AS NEEDED   lithium carbonate 300 MG capsule TAKE 4 CAPSULES (1,200 MG TOTAL) BY MOUTH AT BEDTIME.   ondansetron 4 MG tablet Commonly known as:  ZOFRAN Take 1 tablet (4 mg total) by mouth every 8 (eight) hours as needed for nausea or vomiting.   promethazine 25 MG tablet Commonly  known as:  PHENERGAN Take 1 tablet (25 mg total) by mouth every 6 (six) hours as needed for nausea or vomiting. One hour before procedure   traMADol 50 MG tablet Commonly known as:  ULTRAM Take 1 tablet (50 mg total) by mouth every 6 (six) hours as needed (breakthrough pain).            Discharge Care Instructions  (From admission, onward)        Start     Ordered   11/05/17 0000  Change dressing (specify)    Comments:  Dressing change: remove any dressings tomorrow    11/05/17 1323     Follow-up Information    Will Bonnet, MD In 2 weeks.   Specialty:  Obstetrics and Gynecology Why:  post op follow up Contact information: [place] [address] [phone]           Barnett Applebaum, MD

## 2017-11-05 NOTE — Progress Notes (Signed)
1 Day Post-Op Procedure(s) (LRB): operative laparoscopy, aspiration of left ovarian cyst, left salpingo-oopherectomy (Left)  Subjective: Patient reports nausea, vomiting and incisional pain.   Some improvement in nausea last hour  Objective: I have reviewed patient's vital signs, intake and output, medications and labs.  Abd: Min T, ND Incision: clean, dry and intact Extr: no calf T, no edema  Assessment: s/p Procedure(s): operative laparoscopy, aspiration of left ovarian cyst, left salpingo-oopherectomy (Left): stable and will start clear liquid diet at this time and advance as tolerated; counseled as to results of surgery; post op recovery expectations and restrictions discussed  Plan: Advance diet Encourage ambulation Advance to PO medication Discontinue IV fluids  Potassium as tolerated  Discharge planning:  Once able to tolerate some diet, will consider discharge.    Plan K dur, Percocet, IBF    Rest, gradual activity over next 2 days    Inc care    F/U Dr Glennon Mac 1 week   LOS: 0 days    Kim Obrien 11/05/2017, 9:04 AM

## 2017-11-05 NOTE — Progress Notes (Signed)
Patient feeling better after phenergan. Vitals stable and WDL, except blood pressure is elevated. Patient tolerating PO fluids. Patient ate jello this morning for breakfast, and states "just a bite of everything else."  RN to continue to monitor.

## 2017-11-05 NOTE — Anesthesia Procedure Notes (Signed)
Procedure Name: Intubation Performed by: Vaughan Sine Pre-anesthesia Checklist: Patient identified, Emergency Drugs available, Suction available, Patient being monitored and Timeout performed Patient Re-evaluated:Patient Re-evaluated prior to induction Oxygen Delivery Method: Circle system utilized Preoxygenation: Pre-oxygenation with 100% oxygen Induction Type: IV induction, Rapid sequence and Cricoid Pressure applied Grade View: Grade I Tube type: Oral Tube size: 7.0 mm Number of attempts: 1 Airway Equipment and Method: Rigid stylet Placement Confirmation: ETT inserted through vocal cords under direct vision,  positive ETCO2,  CO2 detector and breath sounds checked- equal and bilateral Secured at: 21 cm Tube secured with: Tape Dental Injury: Teeth and Oropharynx as per pre-operative assessment

## 2017-11-05 NOTE — Progress Notes (Signed)
Patient having extreme nausea and vomiting, MD Marisue Brooklyn, aware, per night shift RN. Phenergan given with morning assessment. Patient has no complaints of pain. Some redness around left hand IV site from potassium. Patient states that it's bearable and will alert RN if she cannot handle potassium fluid anymore. Night RN slowed rate of secondary potassium to 50 ml/hr, as patient could not handle it running at 100 ml/hr. RN to update provider and continue to monitor patient.

## 2017-11-05 NOTE — Progress Notes (Signed)
MD. Tomasita Crumble in department and updated about patient condition this morning.

## 2017-11-05 NOTE — Transfer of Care (Signed)
   Immediate Anesthesia Transfer of Care Note  Patient: Kim Obrien  Procedure(s) Performed: operative laparoscopy, aspiration of left ovarian cyst, left salpingo-oopherectomy (Left Abdomen)  Patient Location: PACU  Anesthesia Type:General  Level of Consciousness: awake and sedated  Airway & Oxygen Therapy: Patient Spontanous Breathing and Patient connected to face mask oxygen  Post-op Assessment: Report given to RN and Post -op Vital signs reviewed and stable  Post vital signs: Reviewed and stable  Last Vitals:  Vitals Value Taken Time  BP    Temp    Pulse    Resp    SpO2      Last Pain:  Vitals:   11/04/17 1906  TempSrc:   PainSc: 10-Worst pain ever         Complications: No apparent anesthesia complications

## 2017-11-05 NOTE — Op Note (Signed)
Operative Note    Pre-Operative Diagnosis:  1) left ovarian mass 2) left ovarian torsion  Post-Operative Diagnosis:  1) left ovarian mass 2) left ovarian torsion  Procedures:  1) Operative laparoscopy 2) left salpingo-oophorectomy and removal of ovarian mass  Primary Surgeon: Prentice Docker, MD   EBL: 75 mL   IVF: 3,300 mL   Urine output: 1,400 mL  Specimens:  1) pelvic washings 2) left fallopian tube and ovary with mass  Drains: none  Complications: None   Disposition: PACU   Condition: Stable   Findings:  1) Well-circumscribed, approximately 20 cm pelvic mass purple in color extending from left adnexa 2) torsion of left adnexal structures 3) normal appearing uterus, right ovary, and fallopian tube 4) no apparent extra-ovarian disease  Procedure Summary:  The patient was taken to the operating room where general anesthesia was administered and found to be adequate. She was placed in the dorsal supine lithotomy position in [place] stirrups and prepped and draped in usual sterile fashion. After a timeout was called an indwelling catheter was placed in her bladder.   Attention was turned to the abdomen where after injection of local anesthetic, a 5 mm Palmar's point (left upper quadrant) incision was made with the scalpel. Entry into the abdomen was obtained via Optiview trocar technique (a blunt entry technique with camera visualization through the obturator upon entry). Verification of entry into the abdomen was obtained using opening pressures. The abdomen was insufflated with CO2. The camera was introduced through the trocar with verification of atraumatic entry.  A right lower quadrant 5 mm and a suprapubic 12 mm port site was placed under direct intra-abdominal camera visualization without difficulty.  Pelvic washings were taken at this point.   A survey of the abdomen was undertaken with the above-noted findings.  The cyst was too large to fit in any retrieval  pouches available. So, the cyst was drained of approximately 2,000 mL of yellow fluid.  Some solid components remained.  This allowed visualization of the left adnexa.  The left ureter was visualized and found to be well away from the infundibulopelvic ligament. The left infundibulopelvic ligament was cauterized using the LigaSure device and transected.  The broad ligament was cauterized and transected medially.  The utero-ovarian ligament was cauterized and transected to free the left fallopian tube and ovary.  The tube and ovary were placed in the upper abdomen. The suprapubic port was converted to a 15 mm port site.  A large retrieval pouch was introduced through the suprapubic port and the left ovary, fallopian tube, and mass were all placed in the pouch.  The pouch was brought to the skin and removed in several pieces above the level of the skin.  Pneumoperitoneum was re-gained and the left adnexal vascular pedicles were found to be hemostatic.    The abdomen was emptied of CO2.  The suprapubic incision site was closed using 0 Vicryl. The subcutaneous layer was re-approximated such that no greater than 2 cm of dead space remained. The skin was closed with 4-0 vicryl.  The other two incision sites were closed at the subcutaneous level to reduce tension on the skin closure. All incision sites were closed using surgical skin glue.  The foley catheter was removed.    The patient tolerated the procedure well.  Sponge, lap, needle, and instrument counts were correct x 2.  VTE prophylaxis: SCDs. Antibiotic prophylaxis: none indicated and none given. She was awakened in the operating room and was taken to the  PACU in stable condition.   Prentice Docker, MD 11/05/2017 2:20 AM

## 2017-11-06 ENCOUNTER — Encounter: Payer: Self-pay | Admitting: Obstetrics and Gynecology

## 2017-11-06 LAB — CA 125: Cancer Antigen (CA) 125: 15.4 U/mL (ref 0.0–38.1)

## 2017-11-08 NOTE — Anesthesia Postprocedure Evaluation (Signed)
Anesthesia Post Note  Patient: Kim Obrien  Procedure(s) Performed: operative laparoscopy, aspiration of left ovarian cyst, left salpingo-oopherectomy (Left Abdomen)  Patient location during evaluation: PACU Anesthesia Type: General Level of consciousness: awake and alert and oriented Pain management: pain level controlled Vital Signs Assessment: post-procedure vital signs reviewed and stable Respiratory status: spontaneous breathing Cardiovascular status: blood pressure returned to baseline Anesthetic complications: no     Last Vitals:  Vitals:   11/05/17 0931 11/05/17 1309  BP: (!) 142/83 122/70  Pulse: 84 84  Resp: 18 18  Temp: 36.6 C 36.7 C  SpO2: 98% 99%    Last Pain:  Vitals:   11/05/17 1309  TempSrc: Oral  PainSc:                  [name]

## 2017-11-11 LAB — SURGICAL PATHOLOGY

## 2017-11-16 ENCOUNTER — Encounter: Payer: Self-pay | Admitting: Obstetrics and Gynecology

## 2017-11-16 ENCOUNTER — Ambulatory Visit (INDEPENDENT_AMBULATORY_CARE_PROVIDER_SITE_OTHER): Payer: Self-pay | Admitting: Obstetrics and Gynecology

## 2017-11-16 VITALS — BP 146/94 | HR 85 | Ht 63.0 in | Wt 204.0 lb

## 2017-11-16 DIAGNOSIS — N838 Other noninflammatory disorders of ovary, fallopian tube and broad ligament: Secondary | ICD-10-CM

## 2017-11-16 DIAGNOSIS — N83512 Torsion of left ovary and ovarian pedicle: Secondary | ICD-10-CM

## 2017-11-16 DIAGNOSIS — Z09 Encounter for follow-up examination after completed treatment for conditions other than malignant neoplasm: Secondary | ICD-10-CM

## 2017-11-16 DIAGNOSIS — N83519 Torsion of ovary and ovarian pedicle, unspecified side: Secondary | ICD-10-CM

## 2017-11-16 DIAGNOSIS — N839 Noninflammatory disorder of ovary, fallopian tube and broad ligament, unspecified: Secondary | ICD-10-CM

## 2017-11-16 NOTE — Progress Notes (Signed)
   Postoperative Follow-up Patient presents post op from Laparoscopic left salpingo-oophorectomy and removal of large (~20cm) left ovarian cyst 11 days ago for left ovarian mass and ovarian torsion.   Subjective: Patient reports marked improvement in her preop symptoms. Eating a regular diet without difficulty. The patient is not having any pain.  Activity: slowly returning to normal activity.  She denies fevers, though she sometimes gets goose bumps. She is tolerating PO, is voiding. She does have some diarrhea at times. Her incisions have not bothered her.   Objective: Vitals:   11/16/17 1533  BP: (!) 146/94  Pulse: 85  SpO2: 99%   Vital Signs: BP (!) 146/94 (BP Location: Left Arm, Patient Position: Sitting, Cuff Size: Large)   Pulse 85   Ht 5\' 3"  (1.6 m)   Wt 204 lb (92.5 kg)   SpO2 99%   BMI 36.14 kg/m  Constitutional: Well nourished, well developed female in no acute distress.  HEENT: normal Skin: Warm and dry.  Extremity: no edema  Abdomen: Soft, non-tender, normal bowel sounds; no bruits, organomegaly or masses. clean, dry, intact and no erythema, induration, warmth, or tenderness at any incision site  Assessment: 52 y.o. s/p laparoscopic LSO and removal of left ovarian cyst for large left ovarian mucinous cystadenoma and left ovarian torsion progressing well  Plan: Patient has done well after surgery with no apparent complications.  I have discussed the post-operative course to date, and the expected progress moving forward.  The patient understands what complications to be concerned about.  I will see the patient in routine follow up, or sooner if needed.    Activity plan: slowly increase.  Discussed pathology findings with her.  Follow up with her PCP for increase in creatinine and low potassium in hospital. Likely secondary to dehydration.    Prentice Docker, MD 11/16/2017, 4:00 PM

## 2017-11-21 NOTE — Progress Notes (Deleted)
       Patient: Kim Obrien Female    DOB: 04-Oct-1965   52 y.o.   MRN: [id number] Visit Date: 11/21/2017  Today's Provider: Lelon Huh, MD   No chief complaint on file.  Subjective:    HPI     No Known Allergies   Current Outpatient Medications:  .  allopurinol (ZYLOPRIM) 100 MG tablet, TAKE 1 TABLET BY MOUTH DAILY (Patient not taking: Reported on 11/04/2017), Disp: 30 tablet, Rfl: 5 .  indomethacin (INDOCIN) 50 MG capsule, TAKE ONE CAPSULE BY MOUTH EVERY 8 HOURS AS NEEDED (Patient not taking: Reported on 11/04/2017), Disp: 30 capsule, Rfl: 5 .  lithium carbonate 300 MG capsule, TAKE 4 CAPSULES (1,200 MG TOTAL) BY MOUTH AT BEDTIME., Disp: 120 capsule, Rfl: 5 .  ondansetron (ZOFRAN) 4 MG tablet, Take 1 tablet (4 mg total) by mouth every 8 (eight) hours as needed for nausea or vomiting. (Patient not taking: Reported on 11/16/2017), Disp: 20 tablet, Rfl: 0 .  promethazine (PHENERGAN) 25 MG tablet, Take 1 tablet (25 mg total) by mouth every 6 (six) hours as needed for nausea or vomiting. One hour before procedure (Patient not taking: Reported on 11/16/2017), Disp: 10 tablet, Rfl: 0  Review of Systems  Constitutional: Negative for appetite change, chills, fatigue and fever.  Respiratory: Negative for chest tightness and shortness of breath.   Cardiovascular: Negative for chest pain and palpitations.  Gastrointestinal: Negative for abdominal pain, nausea and vomiting.  Neurological: Negative for dizziness and weakness.    Social History   Tobacco Use  . Smoking status: Never Smoker  . Smokeless tobacco: Never Used  Substance Use Topics  . Alcohol use: No    Alcohol/week: 0.0 standard drinks   Objective:   There were no vitals taken for this visit. There were no vitals filed for this visit.   Physical Exam      Assessment & Plan:           Lelon Huh, MD  [facility]

## 2017-11-22 ENCOUNTER — Ambulatory Visit: Payer: Self-pay | Admitting: Family Medicine

## 2017-11-30 ENCOUNTER — Ambulatory Visit: Payer: Self-pay | Admitting: Family Medicine

## 2017-12-05 ENCOUNTER — Encounter: Payer: Self-pay | Admitting: Family Medicine

## 2017-12-05 ENCOUNTER — Ambulatory Visit: Payer: Self-pay | Admitting: Family Medicine

## 2017-12-05 VITALS — BP 152/84 | HR 80 | Temp 98.3°F | Wt 205.0 lb

## 2017-12-05 DIAGNOSIS — E876 Hypokalemia: Secondary | ICD-10-CM

## 2017-12-05 NOTE — Patient Instructions (Signed)
.   Please go to the lab draw station in [address] on the second floor of [facility]. The lab is open M-F 8am to 4:30pm

## 2017-12-05 NOTE — Progress Notes (Signed)
       Patient: Kim Obrien Female    DOB: 10-Sep-1965   52 y.o.   MRN: [id number] Visit Date: 12/05/2017  Today's Provider: Lelon Huh, MD   Chief Complaint  Patient presents with  . Follow-up    Pt is due for lab work   Subjective:    HPI Patient has surgery for ovarian torsion last month by Dr. Glennon Mac and was found to have hypokalemia and increased creatinine. She has been working on drinking more fluids. Is still sore, but steadily improving since surgery. No unusual cramping or muscle pains.     No Known Allergies   Current Outpatient Medications:  .  allopurinol (ZYLOPRIM) 100 MG tablet, TAKE 1 TABLET BY MOUTH DAILY, Disp: 30 tablet, Rfl: 5 .  indomethacin (INDOCIN) 50 MG capsule, TAKE ONE CAPSULE BY MOUTH EVERY 8 HOURS AS NEEDED, Disp: 30 capsule, Rfl: 5 .  lithium carbonate 300 MG capsule, TAKE 4 CAPSULES (1,200 MG TOTAL) BY MOUTH AT BEDTIME., Disp: 120 capsule, Rfl: 5 .  ondansetron (ZOFRAN) 4 MG tablet, Take 1 tablet (4 mg total) by mouth every 8 (eight) hours as needed for nausea or vomiting. (Patient not taking: Reported on 11/16/2017), Disp: 20 tablet, Rfl: 0 .  promethazine (PHENERGAN) 25 MG tablet, Take 1 tablet (25 mg total) by mouth every 6 (six) hours as needed for nausea or vomiting. One hour before procedure (Patient not taking: Reported on 11/16/2017), Disp: 10 tablet, Rfl: 0  Review of Systems  Constitutional: Negative.   Gastrointestinal: Negative.   Genitourinary: Negative.   Neurological: Negative for dizziness, light-headedness and headaches.    Social History   Tobacco Use  . Smoking status: Never Smoker  . Smokeless tobacco: Never Used  Substance Use Topics  . Alcohol use: No    Alcohol/week: 0.0 standard drinks   Objective:   BP (!) 152/84 (BP Location: Right Arm, Patient Position: Sitting, Cuff Size: Large)   Pulse 80   Temp 98.3 F (36.8 C) (Oral)   Wt 205 lb (93 kg)   SpO2 97%   BMI 36.31 kg/m  Vitals:   12/05/17 1611  BP: (!)  152/84  Pulse: 80  Temp: 98.3 F (36.8 C)  TempSrc: Oral  SpO2: 97%  Weight: 205 lb (93 kg)     Physical Exam  General appearance: alert, well developed, well nourished, cooperative and in no distress Head: Normocephalic, without obvious abnormality, atraumatic Respiratory: Respirations even and unlabored, normal respiratory rate Extremities: No gross deformities Skin: Skin color, texture, turgor normal. No rashes seen  Psych: Appropriate mood and affect. Neurologic: Mental status: Alert, oriented to person, place, and time, thought content appropriate.     Assessment & Plan:     1. Hypokalemia  - Renal function panel       Lelon Huh, MD  [facility]

## 2017-12-07 LAB — RENAL FUNCTION PANEL
Albumin: 4.1 g/dL (ref 3.5–5.5)
BUN/Creatinine Ratio: 5 — ABNORMAL LOW (ref 9–23)
BUN: 7 mg/dL (ref 6–24)
CALCIUM: 9.5 mg/dL (ref 8.7–10.2)
CO2: 19 mmol/L — ABNORMAL LOW (ref 20–29)
CREATININE: 1.46 mg/dL — AB (ref 0.57–1.00)
Chloride: 107 mmol/L — ABNORMAL HIGH (ref 96–106)
GFR calc Af Amer: 48 mL/min/{1.73_m2} — ABNORMAL LOW (ref >59)
GFR, EST NON AFRICAN AMERICAN: 41 mL/min/{1.73_m2} — AB (ref >59)
GLUCOSE: 147 mg/dL — AB (ref 65–99)
PHOSPHORUS: 3.1 mg/dL (ref 2.5–4.5)
POTASSIUM: 3.6 mmol/L (ref 3.5–5.2)
SODIUM: 141 mmol/L (ref 134–144)

## 2018-01-03 ENCOUNTER — Telehealth: Payer: Self-pay | Admitting: Family Medicine

## 2018-01-03 DIAGNOSIS — N179 Acute kidney failure, unspecified: Secondary | ICD-10-CM

## 2018-01-03 NOTE — Telephone Encounter (Signed)
Patient advised. Lab slip printed and left up front.  

## 2018-01-03 NOTE — Telephone Encounter (Signed)
Please advise is time to recheck kidney functions since they were low last week. Please leave order at lab.

## 2018-01-05 LAB — RENAL FUNCTION PANEL
ALBUMIN: 4.1 g/dL (ref 3.5–5.5)
BUN / CREAT RATIO: 5 — AB (ref 9–23)
BUN: 8 mg/dL (ref 6–24)
CALCIUM: 9.5 mg/dL (ref 8.7–10.2)
CHLORIDE: 107 mmol/L — AB (ref 96–106)
CO2: 21 mmol/L (ref 20–29)
Creatinine, Ser: 1.47 mg/dL — ABNORMAL HIGH (ref 0.57–1.00)
GFR calc Af Amer: 47 mL/min/{1.73_m2} — ABNORMAL LOW (ref >59)
GFR calc non Af Amer: 41 mL/min/{1.73_m2} — ABNORMAL LOW (ref >59)
Glucose: 136 mg/dL — ABNORMAL HIGH (ref 65–99)
PHOSPHORUS: 3.5 mg/dL (ref 2.5–4.5)
Potassium: 3.5 mmol/L (ref 3.5–5.2)
Sodium: 143 mmol/L (ref 134–144)

## 2018-02-14 ENCOUNTER — Other Ambulatory Visit: Payer: Self-pay | Admitting: Family Medicine

## 2018-02-14 MED ORDER — INDOMETHACIN 50 MG PO CAPS: ORAL_CAPSULE | ORAL | 5 refills | Status: DC

## 2018-02-14 NOTE — Telephone Encounter (Signed)
Pt needing a refill on:  indomethacin (INDOCIN) 50 MG capsule  Please fill at:  [id number] - [place], [place] - [address] [phone] (Phone) [phone] (Fax)    Thanks, [employer]

## 2018-03-07 ENCOUNTER — Other Ambulatory Visit: Payer: Self-pay | Admitting: Family Medicine

## 2018-03-07 DIAGNOSIS — F3178 Bipolar disorder, in full remission, most recent episode mixed: Secondary | ICD-10-CM

## 2018-05-03 ENCOUNTER — Encounter: Payer: Self-pay | Admitting: Family Medicine

## 2018-05-03 ENCOUNTER — Ambulatory Visit (INDEPENDENT_AMBULATORY_CARE_PROVIDER_SITE_OTHER): Payer: Self-pay | Admitting: Family Medicine

## 2018-05-03 VITALS — BP 150/86 | HR 82 | Temp 98.4°F | Resp 16 | Wt 202.4 lb

## 2018-05-03 DIAGNOSIS — Z79899 Other long term (current) drug therapy: Secondary | ICD-10-CM

## 2018-05-03 DIAGNOSIS — Z8659 Personal history of other mental and behavioral disorders: Secondary | ICD-10-CM

## 2018-05-03 DIAGNOSIS — R030 Elevated blood-pressure reading, without diagnosis of hypertension: Secondary | ICD-10-CM

## 2018-05-03 DIAGNOSIS — R42 Dizziness and giddiness: Secondary | ICD-10-CM

## 2018-05-03 NOTE — Progress Notes (Signed)
  Subjective:     Patient ID: Kim Obrien, female   DOB: 09-Jan-1966, 53 y.o.   MRN: [id number] Chief Complaint  Patient presents with  . Hypertension    Patient comes in office today to address elevated blood pressure this morning upon awakening. Patient states that she had symptoms of shortness of breath, tremors and feeling dizzy. Patient reports blood pressure this morning was 164/110, patient states that she took Xanax which helped with tremors and shortness of breath. Patient denies any history of HTN or increased stress.    HPI States she has mild residual dizziness. Reports prior hx of panic attacks.States lithium increased 6 years ago with no change since then. 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  Review of Systems     Objective:   Physical Exam Constitutional:      General: She is not in acute distress.    Appearance: She is not ill-appearing.  Cardiovascular:     Rate and Rhythm: Normal rate and regular rhythm.  Pulmonary:     Effort: Pulmonary effort is normal.     Breath sounds: Normal breath sounds.  Musculoskeletal:     Right lower leg: No edema.     Left lower leg: No edema.  Neurological:     Mental Status: She is alert.        Assessment:    1. Elevated blood-pressure reading without diagnosis of hypertension - Comprehensive metabolic panel  2. History of panic attacks - TSH - T4, free  3. Dizziness - Comprehensive metabolic panel - TSH - T4, free - CBC with Differential/Platelet  4. Long-term current use of lithium: ? Lithium toxicity - Lithium level    Plan:    Further f/u pending lab work. F/u with PCP in one week.

## 2018-05-03 NOTE — Patient Instructions (Signed)
We will call you with the lab results. 

## 2018-05-04 ENCOUNTER — Other Ambulatory Visit: Payer: Self-pay | Admitting: Family Medicine

## 2018-05-04 LAB — CBC WITH DIFFERENTIAL/PLATELET
BASOS: 0 %
Basophils Absolute: 0 10*3/uL (ref 0.0–0.2)
EOS (ABSOLUTE): 0.5 10*3/uL — ABNORMAL HIGH (ref 0.0–0.4)
EOS: 4 %
HEMATOCRIT: 35.6 % (ref 34.0–46.6)
HEMOGLOBIN: 12.1 g/dL (ref 11.1–15.9)
IMMATURE GRANULOCYTES: 1 %
Immature Grans (Abs): 0.1 10*3/uL (ref 0.0–0.1)
LYMPHS: 11 %
Lymphocytes Absolute: 1.1 10*3/uL (ref 0.7–3.1)
MCH: 32.5 pg (ref 26.6–33.0)
MCHC: 34 g/dL (ref 31.5–35.7)
MCV: 96 fL (ref 79–97)
Monocytes Absolute: 0.2 10*3/uL (ref 0.1–0.9)
Monocytes: 2 %
NEUTROS ABS: 8.5 10*3/uL — AB (ref 1.4–7.0)
Neutrophils: 82 %
Platelets: 319 10*3/uL (ref 150–450)
RBC: 3.72 x10E6/uL — ABNORMAL LOW (ref 3.77–5.28)
RDW: 14.8 % (ref 11.7–15.4)
WBC: 10.4 10*3/uL (ref 3.4–10.8)

## 2018-05-04 LAB — COMPREHENSIVE METABOLIC PANEL
A/G RATIO: 1.9 (ref 1.2–2.2)
ALBUMIN: 4.3 g/dL (ref 3.8–4.9)
ALK PHOS: 119 IU/L — AB (ref 39–117)
ALT: 20 IU/L (ref 0–32)
AST: 26 IU/L (ref 0–40)
BILIRUBIN TOTAL: 0.4 mg/dL (ref 0.0–1.2)
BUN / CREAT RATIO: 4 — AB (ref 9–23)
BUN: 7 mg/dL (ref 6–24)
CHLORIDE: 107 mmol/L — AB (ref 96–106)
CO2: 16 mmol/L — ABNORMAL LOW (ref 20–29)
Calcium: 9.2 mg/dL (ref 8.7–10.2)
Creatinine, Ser: 1.61 mg/dL — ABNORMAL HIGH (ref 0.57–1.00)
GFR calc non Af Amer: 37 mL/min/{1.73_m2} — ABNORMAL LOW (ref >59)
GFR, EST AFRICAN AMERICAN: 42 mL/min/{1.73_m2} — AB (ref >59)
GLOBULIN, TOTAL: 2.3 g/dL (ref 1.5–4.5)
Glucose: 243 mg/dL — ABNORMAL HIGH (ref 65–99)
Potassium: 3 mmol/L — ABNORMAL LOW (ref 3.5–5.2)
Sodium: 142 mmol/L (ref 134–144)
Total Protein: 6.6 g/dL (ref 6.0–8.5)

## 2018-05-04 LAB — TSH: TSH: 3.32 u[IU]/mL (ref 0.450–4.500)

## 2018-05-04 LAB — LITHIUM LEVEL: Lithium Lvl: 1.1 mmol/L (ref 0.6–1.2)

## 2018-05-04 LAB — T4, FREE: Free T4: 1.02 ng/dL (ref 0.82–1.77)

## 2018-05-04 MED ORDER — POTASSIUM CHLORIDE CRYS ER 20 MEQ PO TBCR: 20.0000 meq | EXTENDED_RELEASE_TABLET | Freq: Every day | ORAL | 0 refills | Status: DC

## 2018-05-04 MED ORDER — CLONAZEPAM 0.5 MG PO TABS: 0.5000 mg | ORAL_TABLET | Freq: Two times a day (BID) | ORAL | 0 refills | Status: DC | PRN

## 2018-05-12 ENCOUNTER — Encounter: Payer: Self-pay | Admitting: Family Medicine

## 2018-05-12 ENCOUNTER — Ambulatory Visit (INDEPENDENT_AMBULATORY_CARE_PROVIDER_SITE_OTHER): Payer: Self-pay | Admitting: Family Medicine

## 2018-05-12 VITALS — BP 158/96 | HR 76 | Temp 98.5°F | Ht 64.0 in | Wt 199.0 lb

## 2018-05-12 DIAGNOSIS — R112 Nausea with vomiting, unspecified: Secondary | ICD-10-CM

## 2018-05-12 DIAGNOSIS — E876 Hypokalemia: Secondary | ICD-10-CM

## 2018-05-12 DIAGNOSIS — R631 Polydipsia: Secondary | ICD-10-CM

## 2018-05-12 DIAGNOSIS — M109 Gout, unspecified: Secondary | ICD-10-CM

## 2018-05-12 MED ORDER — ALLOPURINOL 100 MG PO TABS: 100.0000 mg | ORAL_TABLET | Freq: Every day | ORAL | 5 refills | Status: DC

## 2018-05-12 MED ORDER — CLONAZEPAM 0.5 MG PO TABS: 0.5000 mg | ORAL_TABLET | Freq: Two times a day (BID) | ORAL | 0 refills | Status: DC | PRN

## 2018-05-12 NOTE — Progress Notes (Signed)
Patient: Kim Obrien Female    DOB: 04-12-1966   53 y.o.   MRN: [id number] Visit Date: 05/12/2018  Today's Provider: Lelon Huh, MD   Chief Complaint  Patient presents with  . Hypertension  . Hyperglycemia   Subjective:       Hypertension, follow-up:  BP Readings from Last 3 Encounters:  05/12/18 (!) 158/96  05/03/18 (!) 150/86  12/05/17 (!) 152/84    She was last seen for hypertension 1 weeks ago.  BP at that visit was 150/86. Management since that visit includes Added Clonazepam and Potassium. She reports excellent compliance with treatment. She is not having side effects.  She is not exercising. She is not adherent to low salt diet.   Outside blood pressures are not being checked. She is experiencing fatigue.  Patient denies .   Cardiovascular risk factors include obesity (BMI >= 30 kg/m2).  Use of agents associated with hypertension: none.     Weight trend: stable Wt Readings from Last 3 Encounters:  05/12/18 199 lb (90.3 kg)  05/03/18 202 lb 6.4 oz (91.8 kg)  12/05/17 205 lb (93 kg)    Current diet: in general, an "unhealthy" diet   ------------------------------------------------------------------------ Hypokalemia Noted to have low K = 3.0 on labs last week and take 72meq potassium for the last week. Not had any muscle cramping. Has had trouble with nausea off and on for the last year with vomiting for several hours a couple of times every week. Has had diarrhea off on for several years. 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  Has taken indomethacin off and on for gout, but none in the last few months.   No Known Allergies   Current Outpatient Medications:  .  allopurinol (ZYLOPRIM) 100 MG tablet, TAKE 1 TABLET BY MOUTH DAILY, Disp: 30 tablet, Rfl: 5 .  clonazePAM (KLONOPIN) 0.5 MG tablet, Take 1 tablet (0.5 mg total) by mouth 2 (two) times daily as needed for anxiety., Disp: 20 tablet, Rfl: 0 .  indomethacin (INDOCIN) 50  MG capsule, TAKE ONE CAPSULE BY MOUTH EVERY 8 HOURS AS NEEDED, Disp: 30 capsule, Rfl: 5 .  lithium carbonate 300 MG capsule, TAKE 4 CAPSULES (1,200 MG TOTAL) BY MOUTH AT BEDTIME., Disp: 120 capsule, Rfl: 5 .  potassium chloride SA (K-DUR,KLOR-CON) 20 MEQ tablet, Take 1 tablet (20 mEq total) by mouth daily. (Patient not taking: Reported on 05/12/2018), Disp: 7 tablet, Rfl: 0  Review of Systems  Constitutional: Positive for fatigue. Negative for activity change, appetite change, chills, diaphoresis, fever and unexpected weight change.  Respiratory: Positive for shortness of breath. Negative for apnea, cough, choking, chest tightness, wheezing and stridor.   Cardiovascular: Negative.   Gastrointestinal: Positive for diarrhea, nausea and vomiting. Negative for abdominal distention, abdominal pain, anal bleeding, blood in stool, constipation and rectal pain.       History of IBS.   Endocrine: Positive for polydipsia. Negative for cold intolerance, heat intolerance, polyphagia and polyuria.  Neurological: Negative for dizziness, light-headedness and headaches.    Social History   Tobacco Use  . Smoking status: Never Smoker  . Smokeless tobacco: Never Used  Substance Use Topics  . Alcohol use: No    Alcohol/week: 0.0 standard drinks      Objective:   BP (!) 158/96 (BP Location: Right Arm, Patient Position: Sitting, Cuff Size: Large)   Pulse 76   Temp 98.5 F (36.9 C) (Oral)   Ht 5\' 4"  (1.626 m)  Wt 199 lb (90.3 kg)   BMI 34.16 kg/m  Vitals:   05/12/18 1549  BP: (!) 158/96  Pulse: 76  Temp: 98.5 F (36.9 C)  TempSrc: Oral  Weight: 199 lb (90.3 kg)  Height: 5\' 4"  (1.626 m)     Physical Exam  General Appearance:    Alert, cooperative, no distress, obese  Eyes:    PERRL, conjunctiva/corneas clear, EOM's intact       Lungs:     Clear to auscultation bilaterally, respirations unlabored  Heart:    Regular rate and rhythm  Neurologic:   Awake, alert, oriented x 3. No apparent  focal neurological           defect.          Assessment & Plan    1. Hypokalemia  - Aldosterone + renin activity w/ ratio - Renal function panel  2. Non-intractable vomiting with nausea, unspecified vomiting type   3. Polydipsia  Reviewed extensive differential diagnosis for above sx. She may have underlying adrenal or pituitary dysfunction. Unclear why she is having recurrent episodes of nausea and vomiting, but this may be contributing to low potassium. Her husband reports that she consumes excessive amounts of water, but her sodium levels are normal.   Consider trial of spirolactone due to persistent hypertension.  Consider Gi referral for recurrent nausea and vomiting Consider renal or endocrinology referral due to persistently elevated creatinine and hyponatremia.   4. Gouty arthritis refill- allopurinol (ZYLOPRIM) 100 MG tablet; Take 1 tablet (100 mg total) by mouth daily.  Dispense: 30 tablet; Refill: 5  Rarely taking indomethacin     Lelon Huh, MD  [facility]

## 2018-05-12 NOTE — Patient Instructions (Signed)
.   Please review the attached list of medications and notify my office if there are any errors.   . Please bring all of your medications to every appointment so we can make sure that our medication list is the same as yours.   

## 2018-05-16 ENCOUNTER — Other Ambulatory Visit: Payer: Self-pay | Admitting: Family Medicine

## 2018-05-16 MED ORDER — SPIRONOLACTONE 25 MG PO TABS: 12.5000 mg | ORAL_TABLET | Freq: Every day | ORAL | 0 refills | Status: DC

## 2018-05-21 LAB — RENAL FUNCTION PANEL
Albumin: 4.3 g/dL (ref 3.8–4.9)
BUN/Creatinine Ratio: 6 — ABNORMAL LOW (ref 9–23)
BUN: 9 mg/dL (ref 6–24)
CHLORIDE: 108 mmol/L — AB (ref 96–106)
CO2: 20 mmol/L (ref 20–29)
Calcium: 9.6 mg/dL (ref 8.7–10.2)
Creatinine, Ser: 1.48 mg/dL — ABNORMAL HIGH (ref 0.57–1.00)
GFR calc Af Amer: 47 mL/min/{1.73_m2} — ABNORMAL LOW (ref >59)
GFR calc non Af Amer: 41 mL/min/{1.73_m2} — ABNORMAL LOW (ref >59)
Glucose: 120 mg/dL — ABNORMAL HIGH (ref 65–99)
Phosphorus: 3.3 mg/dL (ref 3.0–4.3)
Potassium: 3.2 mmol/L — ABNORMAL LOW (ref 3.5–5.2)
Sodium: 145 mmol/L — ABNORMAL HIGH (ref 134–144)

## 2018-05-21 LAB — ALDOSTERONE + RENIN ACTIVITY W/ RATIO: Renin: 3.911 ng/mL/hr (ref 0.167–5.380)

## 2018-05-22 ENCOUNTER — Telehealth: Payer: Self-pay | Admitting: Family Medicine

## 2018-05-22 ENCOUNTER — Telehealth: Payer: Self-pay

## 2018-05-22 NOTE — Telephone Encounter (Signed)
-----   Message from Birdie Sons, MD sent at 05/22/2018 12:20 PM EST ----- Renin levels, which is measure of adrenal gland functions, are normal. Continue spironolactone and schedule follow up 3 weeks.  If nausea and vomiting are not better then she needs abdominal ultrasound.

## 2018-05-22 NOTE — Telephone Encounter (Signed)
Pt needing results on recent labs done.  Please advise.  Thanks, [employer]

## 2018-05-22 NOTE — Telephone Encounter (Signed)
Patient advised of results and verbally voiced understanding. She states the nausea and vomiting has improved. Dr. Caryn Section, you will be out of the office in 3 weeks. Is it ok to schedule her follow up appointment for 4 weeks? Please advise.

## 2018-05-22 NOTE — Telephone Encounter (Signed)
I think pt is calling about the rest of her lab work that was still pending when we called her last.   Thanks,   -Mickel Baas

## 2018-05-22 NOTE — Telephone Encounter (Signed)
4 weeks is fine. Thanks!

## 2018-05-23 NOTE — Telephone Encounter (Signed)
Follow up appointment scheduled 06/19/2018 at 3:20pm.

## 2018-05-29 ENCOUNTER — Ambulatory Visit (INDEPENDENT_AMBULATORY_CARE_PROVIDER_SITE_OTHER): Payer: Self-pay | Admitting: Family Medicine

## 2018-05-29 ENCOUNTER — Encounter: Payer: Self-pay | Admitting: Family Medicine

## 2018-05-29 VITALS — BP 164/112 | HR 88 | Temp 98.8°F | Resp 16 | Wt 197.0 lb

## 2018-05-29 DIAGNOSIS — R030 Elevated blood-pressure reading, without diagnosis of hypertension: Secondary | ICD-10-CM

## 2018-05-29 MED ORDER — SPIRONOLACTONE 50 MG PO TABS: 50.0000 mg | ORAL_TABLET | Freq: Every day | ORAL | 0 refills | Status: DC

## 2018-05-29 NOTE — Progress Notes (Signed)
  Subjective:     Patient ID: Kim Obrien, female   DOB: [birth year], 53 y.o.   MRN: [id number] Chief Complaint  Patient presents with  . Hypertension    Patient comes in today c/o elevated BP. She was prescribed sprionlactone about 1 week ago. She reports that BP was 180/120 last night. She reports that dizziness and axiety was relieved with clonazepam. This morning BP was 160/115   HPI Husband takes her bp at home with increased readings as noted above. She gets anxious and takes the clonazepam to calm down. Accompanied by her husband and granddaughter today.  Review of Systems     Objective:   Physical Exam Constitutional:      General: She is not in acute distress.    Appearance: She is not ill-appearing.  Cardiovascular:     Rate and Rhythm: Normal rate and regular rhythm.  Pulmonary:     Effort: Pulmonary effort is normal.     Breath sounds: Normal breath sounds.  Musculoskeletal:     Right lower leg: No edema.     Left lower leg: No edema.  Neurological:     Mental Status: She is alert.  Psychiatric:     Comments: Mild anxiety        Assessment:    1. Elevated blood-pressure reading without diagnosis of hypertension - spironolactone (ALDACTONE) 50 MG tablet; Take 1 tablet (50 mg total) by mouth daily.  Dispense: 30 tablet; Refill: 0    Plan:    Will f/u with her PCP, Dr. Caryn Section in 2-3 weeks.

## 2018-05-29 NOTE — Patient Instructions (Signed)
Please let us know if you can't tolerate the medication before your appointment.

## 2018-06-09 ENCOUNTER — Other Ambulatory Visit: Payer: Self-pay | Admitting: Family Medicine

## 2018-06-09 NOTE — Telephone Encounter (Signed)
[facility] call [facility] [place] about the prescription refill request. A prescription for 50mg  tablets was sent by Mikki Santee on 06/08/2018. Why are they request refills for the 25mg  spironolactone?

## 2018-06-09 NOTE — Telephone Encounter (Signed)
Called the pharmacy, and this was an error. They have that the patient is taking spironolactone 50mg  on file.

## 2018-06-19 ENCOUNTER — Ambulatory Visit (INDEPENDENT_AMBULATORY_CARE_PROVIDER_SITE_OTHER): Payer: Self-pay | Admitting: Family Medicine

## 2018-06-19 ENCOUNTER — Encounter: Payer: Self-pay | Admitting: Family Medicine

## 2018-06-19 VITALS — BP 152/90 | HR 76 | Temp 97.5°F | Resp 18 | Wt 198.0 lb

## 2018-06-19 DIAGNOSIS — F3178 Bipolar disorder, in full remission, most recent episode mixed: Secondary | ICD-10-CM

## 2018-06-19 DIAGNOSIS — E876 Hypokalemia: Secondary | ICD-10-CM

## 2018-06-19 DIAGNOSIS — F419 Anxiety disorder, unspecified: Secondary | ICD-10-CM

## 2018-06-19 DIAGNOSIS — I10 Essential (primary) hypertension: Secondary | ICD-10-CM

## 2018-06-19 NOTE — Progress Notes (Signed)
Patient: Kim Obrien Female    DOB: [birth year]   53 y.o.   MRN: [id number] Visit Date: 06/19/2018  Today's Provider: Lelon Huh, MD   Chief Complaint  Patient presents with  . Blood Pressure Check   Subjective:     HPI  Elevated blood pressure, follow-up:  BP Readings from Last 3 Encounters:  06/19/18 (!) 168/100  05/29/18 (!) 164/112  05/12/18 (!) 158/96    She was last seen for hypertension 3 weeks ago (seen by Carmon Ginsberg, PA-C).  BP at that visit was 164/112. Management since that visit includes changing Spironolactone to 50mg  daily. She reports good compliance with treatment. She is not having side effects.  She is not exercising. She is not adherent to low salt diet.   Outside blood pressures are checked occasionally. She is experiencing none.  Patient denies chest pain, chest pressure/discomfort, claudication, dyspnea, exertional chest pressure/discomfort, fatigue, irregular heart beat, lower extremity edema, near-syncope, orthopnea, palpitations, paroxysmal nocturnal dyspnea, syncope and tachypnea.   Cardiovascular risk factors include obesity (BMI >= 30 kg/m2).  Use of agents associated with hypertension: none.     Weight trend: stable Wt Readings from Last 3 Encounters:  06/19/18 198 lb (89.8 kg)  05/29/18 197 lb (89.4 kg)  05/12/18 199 lb (90.3 kg)    Current diet: in general, an "unhealthy" diet  ------------------------------------------------------------------------  Anxiety:  She states that clonazepam has been working well. She is taking 1/2 tablet up to three times a day. She is not sure why she has been so much more anxious lately, but medications is effective.   She states she has been very fatigued the last several weeks. Is also sleepy, she has never been a good sleeper. She and her husband deny that she snores much or stops breathing when sleeping.   No Known Allergies   Current Outpatient Medications:  .  allopurinol  (ZYLOPRIM) 100 MG tablet, Take 1 tablet (100 mg total) by mouth daily., Disp: 30 tablet, Rfl: 5 .  clonazePAM (KLONOPIN) 0.5 MG tablet, Take 1 tablet (0.5 mg total) by mouth 2 (two) times daily as needed for anxiety., Disp: 20 tablet, Rfl: 0 .  indomethacin (INDOCIN) 50 MG capsule, TAKE ONE CAPSULE BY MOUTH EVERY 8 HOURS AS NEEDED, Disp: 30 capsule, Rfl: 5 .  lithium carbonate 300 MG capsule, TAKE 4 CAPSULES (1,200 MG TOTAL) BY MOUTH AT BEDTIME., Disp: 120 capsule, Rfl: 5 .  spironolactone (ALDACTONE) 50 MG tablet, Take 1 tablet (50 mg total) by mouth daily., Disp: 30 tablet, Rfl: 0 .  potassium chloride SA (K-DUR,KLOR-CON) 20 MEQ tablet, Take 1 tablet (20 mEq total) by mouth daily. (Patient not taking: Reported on 06/19/2018), Disp: 7 tablet, Rfl: 0  Review of Systems  Constitutional: Negative for appetite change, chills, fatigue and fever.  Respiratory: Negative for chest tightness and shortness of breath.   Cardiovascular: Negative for chest pain and palpitations.  Gastrointestinal: Negative for abdominal pain, nausea and vomiting.  Neurological: Negative for dizziness and weakness.    Social History   Tobacco Use  . Smoking status: Never Smoker  . Smokeless tobacco: Never Used  Substance Use Topics  . Alcohol use: No    Alcohol/week: 0.0 standard drinks      Objective:    Vitals:   06/19/18 1530 06/19/18 1534 06/19/18 1552  BP: (!) 162/100 (!) 168/100 (!) 152/90  Pulse: 76    Resp: 18    Temp: (!) 97.5 F (36.4 C)  TempSrc: Oral    Weight: 198 lb (89.8 kg)       Physical Exam  General Appearance:    Alert, cooperative, no distress, obese  Eyes:    PERRL, conjunctiva/corneas clear, EOM's intact       Lungs:     Clear to auscultation bilaterally, respirations unlabored  Heart:    Regular rate and rhythm  Neurologic:   Awake, alert, oriented x 3. No apparent focal neurological           defect.           Assessment & Plan    1. Hypokalemia Doing well with  spironolactone and is off of potassium supplement.  - Renal function panel  2. Hypertension, unspecified type Not controlled, but improved on 50mg  spironolactone.  - Renal function panel  3. Anxiety Much better with scheduled dose of clonazepam  4. Bipolar disorder, in full remission, most recent episode mixed ([place]) Controlled for many years on current dose of lithium.      Lelon Huh, MD  [facility]

## 2018-06-19 NOTE — Patient Instructions (Signed)
.   Please review the attached list of medications and notify my office if there are any errors.   . Please bring all of your medications to every appointment so we can make sure that our medication list is the same as yours.   

## 2018-06-20 LAB — RENAL FUNCTION PANEL
Albumin: 4.2 g/dL (ref 3.8–4.9)
BUN/Creatinine Ratio: 7 — ABNORMAL LOW (ref 9–23)
BUN: 10 mg/dL (ref 6–24)
CO2: 20 mmol/L (ref 20–29)
Calcium: 9.6 mg/dL (ref 8.7–10.2)
Chloride: 107 mmol/L — ABNORMAL HIGH (ref 96–106)
Creatinine, Ser: 1.51 mg/dL — ABNORMAL HIGH (ref 0.57–1.00)
GFR calc Af Amer: 46 mL/min/{1.73_m2} — ABNORMAL LOW (ref >59)
GFR calc non Af Amer: 40 mL/min/{1.73_m2} — ABNORMAL LOW (ref >59)
Glucose: 109 mg/dL — ABNORMAL HIGH (ref 65–99)
Phosphorus: 2.7 mg/dL — ABNORMAL LOW (ref 3.0–4.3)
Potassium: 3.8 mmol/L (ref 3.5–5.2)
Sodium: 142 mmol/L (ref 134–144)

## 2018-06-21 ENCOUNTER — Telehealth: Payer: Self-pay

## 2018-06-21 DIAGNOSIS — R030 Elevated blood-pressure reading, without diagnosis of hypertension: Secondary | ICD-10-CM

## 2018-06-21 MED ORDER — SPIRONOLACTONE 50 MG PO TABS: 50.0000 mg | ORAL_TABLET | Freq: Two times a day (BID) | ORAL | 2 refills | Status: DC

## 2018-06-21 NOTE — Telephone Encounter (Signed)
Patient was notified of results. Expressed understanding. Rx sent to pharmacy. 

## 2018-06-21 NOTE — Telephone Encounter (Signed)
Unable to reach patient on home/mobile number voicemailbox is full will try reaching patient again at a later time. KW

## 2018-06-21 NOTE — Telephone Encounter (Signed)
-----   Message from Birdie Sons, MD sent at 06/20/2018  1:29 PM EDT ----- Potassium levels are still a little low, but better, can stay off of potassium supplements. Increase spironolactone to 50mg  twice a day. Can send in new prescription for #60, rf x 2. Follow up BP check in one month. Be sure to drink a little more water since this medication is a diuretic.

## 2018-06-24 ENCOUNTER — Other Ambulatory Visit: Payer: Self-pay | Admitting: Family Medicine

## 2018-07-12 ENCOUNTER — Telehealth: Payer: Self-pay | Admitting: Family

## 2018-07-12 DIAGNOSIS — A084 Viral intestinal infection, unspecified: Secondary | ICD-10-CM

## 2018-07-12 MED ORDER — ONDANSETRON HCL 4 MG PO TABS: 4.0000 mg | ORAL_TABLET | Freq: Three times a day (TID) | ORAL | 0 refills | Status: DC | PRN

## 2018-07-12 NOTE — Progress Notes (Signed)
We are sorry that you are not feeling well. Here is how we plan to help!  Based on what you have shared with me it looks like you have a Virus that is irritating your GI tract.  Vomiting is the forceful emptying of a portion of the stomach's content through the mouth.  Although nausea and vomiting can make you feel miserable, it's important to remember that these are not diseases, but rather symptoms of an underlying illness.  When we treat short term symptoms, we always caution that any symptoms that persist should be fully evaluated in a medical office.  Approximately 5 minutes was spent documenting and reviewing patient's chart.    I have prescribed a medication that will help alleviate your symptoms and allow you to stay hydrated:  Zofran 4 mg 1 tablet every 8 hours as needed for nausea and vomiting  HOME CARE:  Drink clear liquids.  This is very important! Dehydration (the lack of fluid) can lead to a serious complication.  Start off with 1 tablespoon every 5 minutes for 8 hours.  You may begin eating bland foods after 8 hours without vomiting.  Start with saltine crackers, white bread, rice, mashed potatoes, applesauce.  After 48 hours on a bland diet, you may resume a normal diet.  Try to go to sleep.  Sleep often empties the stomach and relieves the need to vomit.  GET HELP RIGHT AWAY IF:   Your symptoms do not improve or worsen within 2 days after treatment.  You have a fever for over 3 days.  You cannot keep down fluids after trying the medication.  MAKE SURE YOU:   Understand these instructions.  Will watch your condition.  Will get help right away if you are not doing well or get worse.   Thank you for choosing an e-visit. Your e-visit answers were reviewed by a board certified advanced clinical practitioner to complete your personal care plan. Depending upon the condition, your plan could have included both over the counter or prescription medications. Please  review your pharmacy choice. Be sure that the pharmacy you have chosen is open so that you can pick up your prescription now.  If there is a problem you may message your provider in [place] to have the prescription routed to another pharmacy. Your safety is important to [place]. If you have drug allergies check your prescription carefully.  For the next 24 hours, you can use MyChart to ask questions about today's visit, request a non-urgent call back, or ask for a work or school excuse from your e-visit provider. You will get an e-mail in the next two days asking about your experience. I hope that your e-visit has been valuable and will speed your recovery.

## 2018-07-13 ENCOUNTER — Telehealth: Payer: Self-pay | Admitting: Physician Assistant

## 2018-07-13 DIAGNOSIS — E860 Dehydration: Secondary | ICD-10-CM

## 2018-07-13 DIAGNOSIS — R509 Fever, unspecified: Secondary | ICD-10-CM

## 2018-07-13 DIAGNOSIS — K529 Noninfective gastroenteritis and colitis, unspecified: Secondary | ICD-10-CM

## 2018-07-13 DIAGNOSIS — R112 Nausea with vomiting, unspecified: Secondary | ICD-10-CM

## 2018-07-13 NOTE — Progress Notes (Signed)
Message sent to patient requesting further information about current symptoms. Awaiting response.

## 2018-07-13 NOTE — Progress Notes (Signed)
  E-Visit for [employer] Virus Screening  Based on what you have shared with me, you need to seek an evaluation for a severe illness that is causing your symptoms which may be coronavirus or some other illness. I recommend that you be seen and evaluated "face to face". Our Emergency Departments are best equipped to handle patients with severe symptoms.  Giving you are still having issue with vomiting and diarrhea, and take a diuretic (spironolactone) daily which can further worsen dehydration, causing a big impact on kidneys, I want you to be seen ASAP.   I recommend the following:  . If you are having a true medical emergency please call 911. . If you are considered high risk for Corona virus because of a known exposure, fever, shortness of breath and cough, OR if you have severe symptoms of any kind, seek medical care at an emergency room.  . Please call ahead and tell them that you were seen by telemedicine and they have recommended that you have a face to face evaluation. . [facility] Emergency Department [place], [place], [place] [address] [phone]  . [facility] Emergency Department [place], [place], [place] [address] [phone]  . [facility] Emergency Department [place], [place], [place] [address] [phone]  . [facility] Emergency Department [address] [place], [place], [place] [address] [phone]  . [facility] Emergency Department [place], [place], [place] [address] [phone]  NOTE: If you entered your credit card information for this eVisit, you will not be charged. You may see a "hold" on your card for the $35 but that hold will drop off and you will not have a charge processed.   Your e-visit answers were reviewed by a board certified advanced clinical practitioner to complete your personal care plan.  Thank you for using  e-Visits.

## 2018-07-20 ENCOUNTER — Other Ambulatory Visit: Payer: Self-pay

## 2018-07-20 ENCOUNTER — Ambulatory Visit (INDEPENDENT_AMBULATORY_CARE_PROVIDER_SITE_OTHER): Payer: Self-pay | Admitting: Family Medicine

## 2018-07-20 ENCOUNTER — Encounter: Payer: Self-pay | Admitting: Family Medicine

## 2018-07-20 VITALS — BP 121/81 | HR 77 | Temp 98.6°F | Resp 18 | Wt 195.4 lb

## 2018-07-20 DIAGNOSIS — I10 Essential (primary) hypertension: Secondary | ICD-10-CM

## 2018-07-20 DIAGNOSIS — G252 Other specified forms of tremor: Secondary | ICD-10-CM

## 2018-07-20 DIAGNOSIS — E876 Hypokalemia: Secondary | ICD-10-CM

## 2018-07-20 DIAGNOSIS — F3178 Bipolar disorder, in full remission, most recent episode mixed: Secondary | ICD-10-CM

## 2018-07-20 MED ORDER — CLONAZEPAM 0.5 MG PO TABS: 0.5000 mg | ORAL_TABLET | Freq: Two times a day (BID) | ORAL | 3 refills | Status: DC | PRN

## 2018-07-20 NOTE — Patient Instructions (Signed)
.   Please review the attached list of medications and notify my office if there are any errors.   . Please bring all of your medications to every appointment so we can make sure that our medication list is the same as yours.   

## 2018-07-20 NOTE — Progress Notes (Signed)
Patient: Kim Obrien Female    DOB: 08/07/1965   53 y.o.   MRN: [id number] Visit Date: 07/20/2018  Today's Provider: Lelon Huh, MD   Chief Complaint  Patient presents with  . Hypertension   Subjective:     HPI  1 Month Follow for HTN, last seen on 06/19/2018 and had dose of spironolactone doubled due to BP of 152/90. She has been feeling better, but has had more tremor in her right hand since last visit. Otherwise tolerating medication well. Patient states that she needs a refill on Clonazepam.  BMET    Component Value Date/Time   NA 142 06/19/2018 1623   K 3.8 06/19/2018 1623   CL 107 (H) 06/19/2018 1623   CO2 20 06/19/2018 1623   GLUCOSE 109 (H) 06/19/2018 1623   GLUCOSE 224 (H) 11/05/2017 1046   BUN 10 06/19/2018 1623   CREATININE 1.51 (H) 06/19/2018 1623   CALCIUM 9.6 06/19/2018 1623   GFRNONAA 40 (L) 06/19/2018 1623   GFRAA 46 (L) 06/19/2018 1623     No Known Allergies   Current Outpatient Medications:  .  allopurinol (ZYLOPRIM) 100 MG tablet, Take 1 tablet (100 mg total) by mouth daily., Disp: 30 tablet, Rfl: 5 .  clonazePAM (KLONOPIN) 0.5 MG tablet, TAKE 1 TABLET (0.5 MG TOTAL) BY MOUTH 2 (TWO) TIMES DAILY AS NEEDED FOR ANXIETY., Disp: 20 tablet, Rfl: 3 .  indomethacin (INDOCIN) 50 MG capsule, TAKE ONE CAPSULE BY MOUTH EVERY 8 HOURS AS NEEDED, Disp: 30 capsule, Rfl: 5 .  lithium carbonate 300 MG capsule, TAKE 4 CAPSULES (1,200 MG TOTAL) BY MOUTH AT BEDTIME., Disp: 120 capsule, Rfl: 5 .  spironolactone (ALDACTONE) 50 MG tablet, Take 1 tablet (50 mg total) by mouth 2 (two) times daily., Disp: 60 tablet, Rfl: 2 .  ondansetron (ZOFRAN) 4 MG tablet, Take 1 tablet (4 mg total) by mouth every 8 (eight) hours as needed for nausea or vomiting., Disp: 20 tablet, Rfl: 0 .  potassium chloride SA (K-DUR,KLOR-CON) 20 MEQ tablet, Take 1 tablet (20 mEq total) by mouth daily. (Patient not taking: Reported on 06/19/2018), Disp: 7 tablet, Rfl: 0    Social History    Tobacco Use  . Smoking status: Never Smoker  . Smokeless tobacco: Never Used  Substance Use Topics  . Alcohol use: No    Alcohol/week: 0.0 standard drinks      Objective:   BP 121/81 (BP Location: Left Arm, Patient Position: Sitting, Cuff Size: Large)   Pulse 77   Temp 98.6 F (37 C) (Oral)   Resp 18   Wt 195 lb 6.4 oz (88.6 kg)   SpO2 99%   BMI 33.54 kg/m  Vitals:   07/20/18 1102  BP: 121/81  Pulse: 77  Resp: 18  Temp: 98.6 F (37 C)  TempSrc: Oral  SpO2: 99%  Weight: 195 lb 6.4 oz (88.6 kg)     Physical Exam  General appearance: alert, well developed, well nourished, cooperative and in no distress Head: Normocephalic, without obvious abnormality, atraumatic Respiratory: Respirations even and unlabored, normal respiratory rate Extremities: slight intention tremor of right UE.       Assessment & Plan    1. Hypertension, unspecified type Much better since increasing spironolactone to 50 BID  2. Hypokalemia Off of supplemental potassium since starting spironolactone - Renal function panel  3. Bipolar disorder, in full remission, most recent episode mixed ([place]) Well controlled.   - Lithium level refill- clonazePAM (KLONOPIN) 0.5 MG  tablet; Take 1 tablet (0.5 mg total) by mouth 2 (two) times daily as needed for anxiety.  Dispense: 20 tablet; Refill: 3  4. Intention tremor Unclear if secondary to change in medications. Check lithium level as above.      Lelon Huh, MD  [facility]

## 2018-07-21 ENCOUNTER — Telehealth: Payer: Self-pay

## 2018-07-21 LAB — RENAL FUNCTION PANEL
Albumin: 4.1 g/dL (ref 3.8–4.9)
BUN/Creatinine Ratio: 7 — ABNORMAL LOW (ref 9–23)
BUN: 12 mg/dL (ref 6–24)
CO2: 17 mmol/L — ABNORMAL LOW (ref 20–29)
Calcium: 9.5 mg/dL (ref 8.7–10.2)
Chloride: 110 mmol/L — ABNORMAL HIGH (ref 96–106)
Creatinine, Ser: 1.83 mg/dL — ABNORMAL HIGH (ref 0.57–1.00)
GFR calc Af Amer: 36 mL/min/{1.73_m2} — ABNORMAL LOW (ref >59)
GFR calc non Af Amer: 31 mL/min/{1.73_m2} — ABNORMAL LOW (ref >59)
Glucose: 131 mg/dL — ABNORMAL HIGH (ref 65–99)
Phosphorus: 2.7 mg/dL — ABNORMAL LOW (ref 3.0–4.3)
Potassium: 4 mmol/L (ref 3.5–5.2)
Sodium: 142 mmol/L (ref 134–144)

## 2018-07-21 LAB — LITHIUM LEVEL: Lithium Lvl: 1.8 mmol/L (ref 0.6–1.2)

## 2018-07-21 NOTE — Telephone Encounter (Signed)
-----   Message from Birdie Sons, MD sent at 07/21/2018  8:23 AM EDT ----- Good morning,   Your test results are back and should be available to view in MyChart. Your lithium level is high which may be the reason you're having tremors. You should skip the lithium for one day, then cut back to two capsules at bedtime.   Your potassium level is good, but your kidney functions have declined a little. Start drinking about 2 more glasses of water every day to help your kidneys work better. We'll get in touch with you in about a month to recheck lithium levels and kidney functions.   Have a great day.  Dr. Caryn Section

## 2018-07-21 NOTE — Telephone Encounter (Signed)
Called patient regarding lab results. She states that she reviewed them in MyChart.

## 2018-07-28 ENCOUNTER — Other Ambulatory Visit: Payer: Self-pay | Admitting: Family Medicine

## 2018-07-28 DIAGNOSIS — M109 Gout, unspecified: Secondary | ICD-10-CM

## 2018-08-03 ENCOUNTER — Telehealth: Payer: Self-pay | Admitting: Family Medicine

## 2018-08-03 DIAGNOSIS — T56891A Toxic effect of other metals, accidental (unintentional), initial encounter: Secondary | ICD-10-CM

## 2018-08-03 DIAGNOSIS — E876 Hypokalemia: Secondary | ICD-10-CM

## 2018-08-03 DIAGNOSIS — I10 Essential (primary) hypertension: Secondary | ICD-10-CM

## 2018-08-03 NOTE — Telephone Encounter (Signed)
I called and spoke with patient. She says she still has a slight tremor, but it has improved since you reduced the dose of Lithium a few weeks ago.

## 2018-08-03 NOTE — Telephone Encounter (Signed)
-----   Message from Birdie Sons, MD sent at 07/25/2018  4:26 PM EDT ----- Regarding: FW: make sure tremor improving on lower dose of lithium  ----- Message ----- From: Birdie Sons, MD Sent: 07/21/2018   8:49 AM EDT To: Birdie Sons, MD Subject: make sure tremor improving on lower dose of #

## 2018-08-03 NOTE — Telephone Encounter (Signed)
Patient was seen earlier this month with tremor we thought might be related to lithium. Please see if this is improved since we reduced dose of lithium a few weeks ago. Thanks.

## 2018-08-04 NOTE — Telephone Encounter (Signed)
No answer, pt's mailbox is full.   Thanks,   -Mickel Baas

## 2018-08-04 NOTE — Telephone Encounter (Signed)
Information given to patient regarding medication and labs.

## 2018-08-04 NOTE — Telephone Encounter (Signed)
OK, keep taking 2 lithium  at night. Need to recheck lithium levels in 7-10 days. Please advise and leave order at labs.

## 2018-08-11 ENCOUNTER — Telehealth: Payer: Self-pay

## 2018-08-11 DIAGNOSIS — I10 Essential (primary) hypertension: Secondary | ICD-10-CM

## 2018-08-11 DIAGNOSIS — E876 Hypokalemia: Secondary | ICD-10-CM

## 2018-08-11 DIAGNOSIS — N183 Chronic kidney disease, stage 3 unspecified: Secondary | ICD-10-CM

## 2018-08-11 LAB — LITHIUM LEVEL: Lithium Lvl: 1 mmol/L (ref 0.6–1.2)

## 2018-08-11 LAB — RENAL FUNCTION PANEL
Albumin: 4.3 g/dL (ref 3.8–4.9)
BUN/Creatinine Ratio: 8 — ABNORMAL LOW (ref 9–23)
BUN: 15 mg/dL (ref 6–24)
CO2: 14 mmol/L — ABNORMAL LOW (ref 20–29)
Calcium: 9.5 mg/dL (ref 8.7–10.2)
Chloride: 109 mmol/L — ABNORMAL HIGH (ref 96–106)
Creatinine, Ser: 1.93 mg/dL — ABNORMAL HIGH (ref 0.57–1.00)
GFR calc Af Amer: 34 mL/min/{1.73_m2} — ABNORMAL LOW (ref >59)
GFR calc non Af Amer: 29 mL/min/{1.73_m2} — ABNORMAL LOW (ref >59)
Glucose: 141 mg/dL — ABNORMAL HIGH (ref 65–99)
Phosphorus: 3 mg/dL (ref 3.0–4.3)
Potassium: 4.2 mmol/L (ref 3.5–5.2)
Sodium: 136 mmol/L (ref 134–144)

## 2018-08-11 NOTE — Telephone Encounter (Signed)
-----   Message from Birdie Sons, MD sent at 08/11/2018  9:22 AM EDT ----- Lithium levels are back down to normal range, but kidney functions are getting worse, working at <50% of normal. Continue same dose of lithium. Need to drink about 2 more glasses of water a day then she is now. Don't take any OTC antiinflammatories (e.g. ibuprofen or naproxen). Need referral to nephrology for further evaluation of worsening kidney functions.

## 2018-08-11 NOTE — Telephone Encounter (Signed)
Pt advised.  Pt agreed to proceed with the nephrology referral.   Thanks,   -Mickel Baas

## 2018-08-30 ENCOUNTER — Telehealth: Payer: Self-pay | Admitting: Family Medicine

## 2018-08-30 NOTE — Telephone Encounter (Signed)
Spoke to pt and she has appt on 10/05/18 in [place]

## 2018-08-30 NOTE — Telephone Encounter (Signed)
Patient was referred to nephrology at the beginning of the month. Can you please check with patient and see if she got appointment? Thanks.

## 2018-09-05 ENCOUNTER — Other Ambulatory Visit: Payer: Self-pay | Admitting: Family Medicine

## 2018-09-05 DIAGNOSIS — F3178 Bipolar disorder, in full remission, most recent episode mixed: Secondary | ICD-10-CM

## 2018-09-10 ENCOUNTER — Other Ambulatory Visit: Payer: Self-pay | Admitting: Family Medicine

## 2018-09-10 DIAGNOSIS — R030 Elevated blood-pressure reading, without diagnosis of hypertension: Secondary | ICD-10-CM

## 2018-10-05 ENCOUNTER — Telehealth: Payer: Self-pay

## 2018-10-05 DIAGNOSIS — F3178 Bipolar disorder, in full remission, most recent episode mixed: Secondary | ICD-10-CM

## 2018-10-05 NOTE — Telephone Encounter (Signed)
Dr. Caryn Section, Dr. Holley Raring called requesting to speak with you about this patient. I advised him that you were not here today and he said It was fine to leave a message for you to call him tomorrow. Please call Dr. Holley Raring back at [phone].

## 2018-10-06 ENCOUNTER — Telehealth: Payer: Self-pay | Admitting: Family Medicine

## 2018-10-06 DIAGNOSIS — F3178 Bipolar disorder, in full remission, most recent episode mixed: Secondary | ICD-10-CM

## 2018-10-06 MED ORDER — QUETIAPINE FUMARATE 100 MG PO TABS: ORAL_TABLET | ORAL | 0 refills | Status: DC

## 2018-10-06 NOTE — Telephone Encounter (Signed)
The Quetiapine you called in is $250.00 and she cannot afford that.   Please send in something cheaper. [facility]

## 2018-10-06 NOTE — Telephone Encounter (Signed)
Please review. Thanks!  

## 2018-10-06 NOTE — Telephone Encounter (Signed)
Pt advised.  She agreed with the referral to psychiatry   Thanks,   -Mickel Baas

## 2018-10-06 NOTE — Telephone Encounter (Signed)
Please advise patient we received recommendations from Dr. Holley Raring that lithium is probably causing her kidney problems and needs referral to psychiatry to manage change in meds. Have entered referral. In the meantime, can start Taking quetiapine in place of lithium [facility]

## 2018-10-06 NOTE — Telephone Encounter (Signed)
Pt advised.  Please send to [facility].    Thanks,    -Mickel Baas

## 2018-10-06 NOTE — Telephone Encounter (Signed)
I suggest she change to [employer]. She can use a GoodRx coupon and get it for $20.70

## 2018-10-12 ENCOUNTER — Other Ambulatory Visit: Payer: Self-pay | Admitting: Nephrology

## 2018-10-12 ENCOUNTER — Telehealth: Payer: Self-pay

## 2018-10-12 DIAGNOSIS — N183 Chronic kidney disease, stage 3 unspecified: Secondary | ICD-10-CM

## 2018-10-12 NOTE — Telephone Encounter (Signed)
Patient advised as below. Patient verbalizes understanding and is in agreement with treatment plan.  

## 2018-10-12 NOTE — Telephone Encounter (Signed)
No she shouldn't decrease it. Please take as Dr. Caryn Section prescribed.

## 2018-10-12 NOTE — Telephone Encounter (Signed)
Please review for Dr. Caryn Section.    Pt recently switched from Lithium to Quetiapine.  She has titrated to 2 tabs twice a day.  Since being on that dose pt is reporting her BP has been elevated 150's/90's.  She says her BP usually runs about 120's/80's.  Once she takes clonzepam her BP improves to 130/90's.  Pt is wanted to know if she should reduce her Quetiapine.  Please advise.   Thanks,   -Mickel Baas

## 2018-10-24 ENCOUNTER — Other Ambulatory Visit: Payer: Self-pay

## 2018-10-24 ENCOUNTER — Ambulatory Visit
Admission: RE | Admit: 2018-10-24 | Discharge: 2018-10-24 | Disposition: A | Discharge status: Home / Self Care | Payer: Self-pay | Source: Ambulatory Visit | Attending: Nephrology | Admitting: Nephrology

## 2018-10-24 DIAGNOSIS — N183 Chronic kidney disease, stage 3 unspecified: Secondary | ICD-10-CM

## 2018-10-29 ENCOUNTER — Telehealth: Payer: Self-pay | Admitting: Family Medicine

## 2018-10-29 NOTE — Telephone Encounter (Signed)
Received notice from [facility] they were unable to contact patient to schedule appt for bipolar disorder. Please check with patient and see if she wants to see a different psychiatrist, or give her contact information for [facility] to schedule appointment.

## 2018-10-30 NOTE — Telephone Encounter (Signed)
Pt advised.  I gave pt the number to [facility]   Thanks,   -Mickel Baas

## 2018-11-07 ENCOUNTER — Telehealth: Payer: Self-pay | Admitting: Family Medicine

## 2018-11-07 DIAGNOSIS — F3178 Bipolar disorder, in full remission, most recent episode mixed: Secondary | ICD-10-CM

## 2018-11-07 NOTE — Telephone Encounter (Signed)
Pt is requesting call back to discuss a couple questions she has about QUEtiapine (SEROQUEL) 100 MG tablet. Thanks TNP

## 2018-11-08 MED ORDER — QUETIAPINE FUMARATE 200 MG PO TABS: 200.0000 mg | ORAL_TABLET | Freq: Two times a day (BID) | ORAL | 0 refills | Status: DC

## 2018-11-08 NOTE — Telephone Encounter (Signed)
Sent new for 200mg , tabs, so she only has to take 1 tablet twice a day

## 2018-11-08 NOTE — Telephone Encounter (Signed)
She will be needing a refill asap  [employer]

## 2018-11-08 NOTE — Telephone Encounter (Signed)
Patient is requesting a refill for Seroquel. She has titrated up to taking 2 pills twice a day. Patient says she needs this refilled asap because she will be out of the medication tomorrow. Patient also mentioned that she has an appointment to establish care with Psychiatry on Monday (11/13/2018).

## 2018-11-08 NOTE — Telephone Encounter (Signed)
Patient advised and verbally voiced understanding.  

## 2018-11-13 ENCOUNTER — Ambulatory Visit (INDEPENDENT_AMBULATORY_CARE_PROVIDER_SITE_OTHER): Payer: Self-pay | Admitting: Licensed Clinical Social Worker

## 2018-11-13 DIAGNOSIS — F3162 Bipolar disorder, current episode mixed, moderate: Secondary | ICD-10-CM

## 2018-11-13 NOTE — Progress Notes (Signed)
Comprehensive Clinical Assessment (CCA) Note  11/13/2018 CHARO PHILIPP [id number]   Virtual Visit via Telephone Note  I connected with Carmel [place] on 11/13/18 at  9:00 AM EDT by telephone and verified that I am speaking with the correct person using two identifiers.  I discussed the limitations, risks, security and privacy concerns of performing an evaluation and management service by telephone and the availability of in person appointments. I also discussed with the patient that there may be a patient responsible charge related to this service. The patient expressed understanding and agreed to proceed.   I discussed the assessment and treatment plan with the patient. The patient was provided an opportunity to ask questions and all were answered. The patient agreed with the plan and demonstrated an understanding of the instructions.   The patient was advised to call back or seek an in-person evaluation if the symptoms worsen or if the condition fails to improve as anticipated.     Visit Diagnosis:      ICD-10-CM   1. Bipolar 1 disorder, mixed, moderate (HCC)  F31.62       CCA Part One  Part One has been completed on paper by the patient.  (See scanned document in Chart Review)  CCA Part Two A  Intake/Chief Complaint:  CCA Intake With Chief Complaint CCA Part Two Date: 11/13/18 CCA Part Two Time: 0900 Chief Complaint/Presenting Problem: depressive episodes (of bipolar disorder), anxiety about health problems Patients Currently Reported Symptoms/Problems: depression usually only lasts 2 days or so, mostly depressed mood, doesn't impact functioning at this point, mania is more about high energy than recklessness, anxiety is worry about new health problems popping up and how to pay for procedures Collateral Involvement: husband Type of Services Patient Feels Are Needed: therapy and med management Initial Clinical Notes/Concerns: has been on Lithium for 20 years, until it became  toxic, having depression now; stage 4 kidney failure and high blood pressure   Patient Report: Patient states that both her manic and depressive episodes only last for a day or two, and that they do not really impact her functioning. She is more bothered by her worry/anxiety, though it does not rise to the standard of its own diagnosis. Patient reports having quite a few health problems in the past 2 years, and being worried about how to pay for them and what will happen if another one pops up. Patient is not interested in counseling at this time, but reports that her primary doctor recently changed her to Seroquel when she experienced Lithium toxicity. She would like to see a psychiatrist to regulate her medications.   Mental Health Symptoms Depression:  Depression: Change in energy/activity, Sleep (too much or little), Increase/decrease in appetite(trouble sleeping more than a couple hours at a time, Seroquel has increased her appetite)  Mania:  Mania: Change in energy/activity, Increased Energy, Racing thoughts  Anxiety:   Anxiety: Worrying, Fatigue, Restlessness, Sleep  Psychosis:  Psychosis: N/A  Trauma:  Trauma: N/A  Obsessions:  Obsessions: N/A  Compulsions:  Compulsions: N/A  Inattention:  Inattention: N/A  Hyperactivity/Impulsivity:  Hyperactivity/Impulsivity: N/A  Oppositional/Defiant Behaviors:  Oppositional/Defiant Behaviors: N/A  Borderline Personality:  Emotional Irregularity: N/A  Other Mood/Personality Symptoms:      Mental Status Exam Appearance and self-care  Stature:    Unable to assess in telephone session  Weight:    Unable to assess in telephone session  Clothing:    Unable to assess in telephone session  Grooming:    Unable  to assess in telephone session  Cosmetic use:    Unable to assess in telephone session  Posture/gait:    Unable to assess in telephone session  Motor activity:    Unable to assess in telephone session  Sensorium  Attention:  Attention: Normal   Concentration:  Concentration: Normal  Orientation:  Orientation: X5  Recall/memory:  Recall/Memory: Defective in immediate  Affect and Mood  Affect:    Unable to assess in telephone session  Mood:  Mood: Euthymic  Relating  Eye contact:    Unable to assess in telephone session  Facial expression:    Unable to assess in telephone session  Attitude toward examiner:  Attitude Toward Examiner: Cooperative  Thought and Language  Speech flow: Speech Flow: Normal  Thought content:  Thought Content: Appropriate to mood and circumstances  Preoccupation:    Unable to assess in telephone session  Hallucinations:    Unable to assess in telephone session  Organization:    Unable to assess in telephone session  Strawn of Knowledge:  Fund of Knowledge: Average  Intelligence:  Intelligence: Average  Abstraction:  Abstraction: Normal  Judgement:  Judgement: Normal  Reality Testing:  Reality Testing: Realistic  Insight:  Insight: Fair  Decision Making:  Decision Making: Normal  Social Functioning  Social Maturity:  Social Maturity: Isolates  Social Judgement:  Social Judgement: Normal  Stress  Stressors:  Stressors: Illness, Money  Coping Ability:  Coping Ability: Deficient supports  Skill Deficits:     Supports:   Husband, adult children   Family and Psychosocial History: Family history Marital status: Married Number of Years Married: 17 What types of issues is patient dealing with in the relationship?: supportive now, has been problematic in the past Additional relationship information: husband is recovering addict, in recovery for 10 years; they divorced then remarried 6 years ago Are you sexually active?: Yes What is your sexual orientation?: heterosexual Does patient have children?: Yes How many children?: 2 How is patient's relationship with their children?: 52 and 24, close  Childhood History:  Childhood History By whom was/is the patient raised?: Father,  Mother/father and step-parent Additional childhood history information: didn't see much of mother as a child, she and dad divorced in bad circumstances and she was an addict; dad remarried when she was about 47; dad was an alcoholic but was functional and even nicer when he was drinking; she had anxiety about his drinking as a child because they would argue or he would drive; it was embarrassing when she was a kid Description of patient's relationship with caregiver when they were a child: never was close with mother; close with stepmother, good with dad when he wasn't drinking Patient's description of current relationship with people who raised him/her: estranged from mother when she died a few years ago; close with dad and stepmom see each other 3x/week Does patient have siblings?: Yes Number of Siblings: 1 Description of patient's current relationship with siblings: sister, never was close to her; sister has mental health problems but will not get treatment Did patient suffer any verbal/emotional/physical/sexual abuse as a child?: No Did patient suffer from severe childhood neglect?: No Has patient ever been sexually abused/assaulted/raped as an adolescent or adult?: No Was the patient ever a victim of a crime or a disaster?: No Witnessed domestic violence?: No Has patient been effected by domestic violence as an adult?: No  CCA Part Two B  Employment/Work Situation: Employment / Work Situation Employment situation: Unemployed(applied for disability,  has not worked since moving back to [place] 6 years ago) Patient's job has been impacted by current illness: No 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 the patient employed at that time?: 15 years Did You Receive Any Psychiatric Treatment/Services While in the [employer]?: No Are There Guns or Other Weapons in [place]?: No  Education: Education Did Teacher, adult education From [employer]?: Yes Did Physicist, medical?:  Yes What Type of College Degree Do you Have?: associates What Was Your Major?: cosmetology Did You Have An Individualized Education Program (IIEP): No Did You Have Any Difficulty At School?: No  Religion: Religion/Spirituality Are You A Religious Person?: Yes How Might This Affect Treatment?: it won't  Leisure/Recreation: Leisure / Recreation Leisure and Hobbies: likes to cook and clean and decorate the home, doing hair for family members sometimes  Exercise/Diet: Exercise/Diet Do You Exercise?: No Have You Gained or Lost A Significant Amount of Weight in the Past Six Months?: No Do You Follow a Special Diet?: No Do You Have Any Trouble Sleeping?: No  CCA Part Two C  Alcohol/Drug Use: Alcohol / Drug Use History of alcohol / drug use?: No history of alcohol / drug abuse  CCA Part Three  ASAM's:  Six Dimensions of Multidimensional Assessment  Dimension 1:  Acute Intoxication and/or Withdrawal Potential:     Dimension 2:  Biomedical Conditions and Complications:     Dimension 3:  Emotional, Behavioral, or Cognitive Conditions and Complications:     Dimension 4:  Readiness to Change:     Dimension 5:  Relapse, Continued use, or Continued Problem Potential:     Dimension 6:  Recovery/Living Environment:      Substance use Disorder (SUD)    Social Function:  Social Functioning Social Maturity: Isolates Social Judgement: Normal  Stress:  Stress Stressors: Illness, Money Coping Ability: Deficient supports Patient Takes Medications The Way The Doctor Instructed?: Yes Priority Risk: Low Acuity  Risk Assessment- Self-Harm Potential: Risk Assessment For Self-Harm Potential Thoughts of Self-Harm: No current thoughts Method: No plan Availability of Means: No access/NA  Risk Assessment -Dangerous to Others Potential: Risk Assessment For Dangerous to Others Potential Method: No Plan Availability of Means: No access or NA Intent: Vague intent or NA Notification Required:  No need or identified person  DSM5 Diagnoses: Patient Active Problem List   Diagnosis Date Noted  . Hypertension 06/19/2018  . Ovarian mass, left 11/05/2017  . Ovarian torsion 11/05/2017  . Torsion of left ovary and ovarian pedicle 11/05/2017  . Loose bowel movement 07/10/2015  . Arthritis 07/08/2015  . Bipolar affective disorder ([place]) 07/08/2015  . Gouty arthritis 07/08/2015  . Osteoarthritis of knee 07/08/2015  . Chronic abdominal pain 07/08/2015    Patient Centered Plan: Patient is on the following Treatment Plan(s):  Mood Instability  Recommendations for Services/Supports/Treatments: Recommendations for Services/Supports/Treatments Recommendations For Services/Supports/Treatments: Medication Management, Individual Therapy  Treatment Plan Summary: OP Treatment Plan Summary: (Stabilize mood to assure healthy levels of energy, activity )  I provided 54 minutes of non-face-to-face time during this encounter.   Lillie Fragmin, LCSW

## 2018-12-05 ENCOUNTER — Other Ambulatory Visit: Payer: Self-pay

## 2018-12-05 DIAGNOSIS — F3178 Bipolar disorder, in full remission, most recent episode mixed: Secondary | ICD-10-CM

## 2018-12-05 MED ORDER — QUETIAPINE FUMARATE 200 MG PO TABS: 200.0000 mg | ORAL_TABLET | Freq: Two times a day (BID) | ORAL | 0 refills | Status: DC

## 2018-12-05 NOTE — Telephone Encounter (Signed)
Patient states that Dr. Caryn Section referral her for psychiatrist evaluation but since patient does not have insurance they are charging her $350 per visit. Patient states that she can not afford those visits and she will be out of her medication (Seroquel) this week. Please advise.

## 2018-12-06 ENCOUNTER — Telehealth: Payer: Self-pay

## 2018-12-06 DIAGNOSIS — F3178 Bipolar disorder, in full remission, most recent episode mixed: Secondary | ICD-10-CM

## 2018-12-06 MED ORDER — QUETIAPINE FUMARATE 200 MG PO TABS: 200.0000 mg | ORAL_TABLET | Freq: Two times a day (BID) | ORAL | 0 refills | Status: DC

## 2018-12-06 NOTE — Telephone Encounter (Signed)
Patient had Seroquel sent in to wrong pharmacy yesterday.  Needs to have it sent to [facility].  CB#  [phone]

## 2018-12-19 ENCOUNTER — Emergency Department: Payer: Self-pay

## 2018-12-19 ENCOUNTER — Encounter: Payer: Self-pay | Admitting: Emergency Medicine

## 2018-12-19 ENCOUNTER — Telehealth: Payer: Self-pay

## 2018-12-19 ENCOUNTER — Other Ambulatory Visit: Payer: Self-pay

## 2018-12-19 ENCOUNTER — Emergency Department
Admission: EM | Admit: 2018-12-19 | Discharge: 2018-12-20 | Disposition: A | Discharge status: Home / Self Care | Payer: Self-pay | Attending: Emergency Medicine | Admitting: Emergency Medicine

## 2018-12-19 DIAGNOSIS — R8271 Bacteriuria: Secondary | ICD-10-CM | POA: Insufficient documentation

## 2018-12-19 DIAGNOSIS — Z20828 Contact with and (suspected) exposure to other viral communicable diseases: Secondary | ICD-10-CM | POA: Insufficient documentation

## 2018-12-19 DIAGNOSIS — I10 Essential (primary) hypertension: Secondary | ICD-10-CM | POA: Insufficient documentation

## 2018-12-19 DIAGNOSIS — R5383 Other fatigue: Secondary | ICD-10-CM | POA: Insufficient documentation

## 2018-12-19 DIAGNOSIS — Z79899 Other long term (current) drug therapy: Secondary | ICD-10-CM | POA: Insufficient documentation

## 2018-12-19 DIAGNOSIS — R0600 Dyspnea, unspecified: Secondary | ICD-10-CM | POA: Insufficient documentation

## 2018-12-19 LAB — FIBRIN DERIVATIVES D-DIMER (ARMC ONLY): Fibrin derivatives D-dimer (ARMC): 497.37 ng/mL (FEU) (ref 0.00–499.00)

## 2018-12-19 LAB — BASIC METABOLIC PANEL
Anion gap: 8 (ref 5–15)
BUN: 14 mg/dL (ref 6–20)
CO2: 20 mmol/L — ABNORMAL LOW (ref 22–32)
Calcium: 9.4 mg/dL (ref 8.9–10.3)
Chloride: 111 mmol/L (ref 98–111)
Creatinine, Ser: 1.9 mg/dL — ABNORMAL HIGH (ref 0.44–1.00)
GFR calc Af Amer: 35 mL/min — ABNORMAL LOW (ref >60)
GFR calc non Af Amer: 30 mL/min — ABNORMAL LOW (ref >60)
Glucose, Bld: 238 mg/dL — ABNORMAL HIGH (ref 70–99)
Potassium: 4 mmol/L (ref 3.5–5.1)
Sodium: 139 mmol/L (ref 135–145)

## 2018-12-19 LAB — TSH: TSH: 0.3 u[IU]/mL — ABNORMAL LOW (ref 0.350–4.500)

## 2018-12-19 LAB — CBC
HCT: 37.9 % (ref 36.0–46.0)
Hemoglobin: 12.2 g/dL (ref 12.0–15.0)
MCH: 27.9 pg (ref 26.0–34.0)
MCHC: 32.2 g/dL (ref 30.0–36.0)
MCV: 86.7 fL (ref 80.0–100.0)
Platelets: 273 10*3/uL (ref 150–400)
RBC: 4.37 MIL/uL (ref 3.87–5.11)
RDW: 14 % (ref 11.5–15.5)
WBC: 9.1 10*3/uL (ref 4.0–10.5)
nRBC: 0 % (ref 0.0–0.2)

## 2018-12-19 LAB — T4, FREE: Free T4: 0.88 ng/dL (ref 0.61–1.12)

## 2018-12-19 LAB — TROPONIN I (HIGH SENSITIVITY): Troponin I (High Sensitivity): 5 ng/L (ref <18)

## 2018-12-19 MED ORDER — ALBUTEROL SULFATE (2.5 MG/3ML) 0.083% IN NEBU: 5.0000 mg | INHALATION_SOLUTION | Freq: Once | RESPIRATORY_TRACT | Status: DC

## 2018-12-19 NOTE — ED Triage Notes (Signed)
Pt here with c/o shob and fatigue since last Thursday, denies covid exposure, some slight chest pressure over the weekend as well. Would feel dizzy getting up to go to the bathroom, NAD at this time.

## 2018-12-19 NOTE — ED Provider Notes (Signed)
[facility] Emergency Department Provider Note  ____________________________________________  Time seen: Approximately 11:01 PM  I have reviewed the triage vital signs and the nursing notes.   HISTORY  Chief Complaint Fatigue and Shortness of Breath    HPI Kim Obrien is a 53 y.o. female  With a history of arthritis, bipolar disorder, hypertension  who comes the ED complaining of fatigue for the past week.  Gradual onset, worse with any kind of activity, better after resting for about an hour.  Associated with shortness of breath.  No chest pain or cough.  No fever chills body aches sweats change in sense of taste or smell.  Denies any sick contacts although she does go out in public to the store.  Reports eating and drinking normally and normal urine output.  No vomiting or diarrhea.  Denies any pain complaints whatsoever.  No recent travel trauma hospitalization or surgery.  No history of DVT PE or other VTE.  Denies taking any exogenous hormones or smoking.  Denies cancer history     Past Medical History:  Diagnosis Date  . Arthritis   . Bipolar 1 disorder ([place])   . History of chicken pox      Patient Active Problem List   Diagnosis Date Noted  . Hypertension 06/19/2018  . Ovarian mass, left 11/05/2017  . Ovarian torsion 11/05/2017  . Torsion of left ovary and ovarian pedicle 11/05/2017  . Loose bowel movement 07/10/2015  . Arthritis 07/08/2015  . Bipolar affective disorder ([place]) 07/08/2015  . Gouty arthritis 07/08/2015  . Osteoarthritis of knee 07/08/2015  . Chronic abdominal pain 07/08/2015     Past Surgical History:  Procedure Laterality Date  . LAPAROSCOPY Left 11/04/2017   Procedure: operative laparoscopy, aspiration of left ovarian cyst, left salpingo-oopherectomy;  Surgeon: Will Bonnet, MD;  Location: [facility] ORS;  Service: Gynecology;  Laterality: Left;  . pap smear  2008   in [place]  . partial wisdom tooth extraction   2004     Prior to Admission medications   Medication Sig Start Date End Date Taking? Authorizing Provider  allopurinol (ZYLOPRIM) 100 MG tablet TAKE 1 TABLET BY MOUTH EVERY DAY 07/30/18   Birdie Sons, MD  clonazePAM (KLONOPIN) 0.5 MG tablet Take 1 tablet (0.5 mg total) by mouth 2 (two) times daily as needed for anxiety. 07/20/18   Birdie Sons, MD  indomethacin (INDOCIN) 50 MG capsule TAKE ONE CAPSULE BY MOUTH EVERY 8 HOURS AS NEEDED 02/14/18   Birdie Sons, MD  lithium carbonate 300 MG capsule Take 2 capsules (600 mg total) by mouth at bedtime. 09/05/18   Birdie Sons, MD  ondansetron (ZOFRAN) 4 MG tablet Take 1 tablet (4 mg total) by mouth every 8 (eight) hours as needed for nausea or vomiting. 07/12/18   Sharion Balloon, FNP  QUEtiapine (SEROQUEL) 200 MG tablet Take 1 tablet (200 mg total) by mouth 2 (two) times daily. 12/06/18   Birdie Sons, MD  spironolactone (ALDACTONE) 50 MG tablet TAKE 1 TABLET BY MOUTH TWICE A DAY 09/10/18   Birdie Sons, MD     Allergies Patient has no known allergies.   Family History  Problem Relation Age of Onset  . Diabetes Mother        type 1  . Heart disease Mother   . Depression Mother   . Anxiety disorder Mother   . Prostate cancer Father   . Bipolar disorder Father   . Gout Father   .  Hypertension Father   . Anxiety disorder Sister     Social History Social History   Tobacco Use  . Smoking status: Never Smoker  . Smokeless tobacco: Never Used  Substance Use Topics  . Alcohol use: No    Alcohol/week: 0.0 standard drinks  . Drug use: No    Review of Systems  Constitutional:   No fever or chills.  ENT:   No sore throat. No rhinorrhea. Cardiovascular:   No chest pain or syncope. Respiratory: Positive shortness of breath without cough. Gastrointestinal:   Negative for abdominal pain, vomiting and diarrhea.  Musculoskeletal:   Negative for focal pain or swelling All other systems reviewed and are negative except as  documented above in ROS and HPI.  ____________________________________________   PHYSICAL EXAM:  VITAL SIGNS: ED Triage Vitals [12/19/18 1532]  Enc Vitals Group     BP 131/87     Pulse Rate (!) 117     Resp 16     Temp 98.4 F (36.9 C)     Temp Source Oral     SpO2 99 %     Weight 190 lb (86.2 kg)     Height 5\' 3"  (1.6 m)     Head Circumference      Peak Flow      Pain Score 0     Pain Loc      Pain Edu?      Excl. in [place]?     Vital signs reviewed, nursing assessments reviewed.   Constitutional:   Alert and oriented. Non-toxic appearance. Eyes:   Conjunctivae are normal. EOMI. PERRL. ENT      Head:   Normocephalic and atraumatic.      Nose:   No congestion/rhinnorhea.       Mouth/Throat:   MMM, no pharyngeal erythema. No peritonsillar mass.       Neck:   No meningismus. Full ROM. Hematological/Lymphatic/Immunilogical:   No cervical lymphadenopathy. Cardiovascular:   Tachycardia heart rate 105. Symmetric bilateral radial and DP pulses.  No murmurs. Cap refill less than 2 seconds. Respiratory:   Normal respiratory effort without tachypnea/retractions. Breath sounds are clear and equal bilaterally. No wheezes/rales/rhonchi. Gastrointestinal:   Soft and nontender. Non distended. There is no CVA tenderness.  No rebound, rigidity, or guarding.  Musculoskeletal:   Normal range of motion in all extremities. No joint effusions.  No lower extremity tenderness.  No edema. Neurologic:   Normal speech and language.  Motor grossly intact. No acute focal neurologic deficits are appreciated.  Skin:    Skin is warm, dry and intact. No rash noted.  No petechiae, purpura, or bullae.  ____________________________________________    LABS (pertinent positives/negatives) (all labs ordered are listed, but only abnormal results are displayed) Labs Reviewed  BASIC METABOLIC PANEL - Abnormal; Notable for the following components:      Result Value   CO2 20 (*)    Glucose, Bld 238 (*)     Creatinine, Ser 1.90 (*)    GFR calc non Af Amer 30 (*)    GFR calc Af Amer 35 (*)    All other components within normal limits  TSH - Abnormal; Notable for the following components:   TSH 0.300 (*)    All other components within normal limits  NOVEL CORONAVIRUS, NAA (HOSP ORDER, SEND-OUT TO REF LAB; TAT 18-24 HRS)  CBC  T4, FREE  FIBRIN DERIVATIVES D-DIMER ([facility] ONLY)  URINALYSIS, COMPLETE (UACMP) WITH MICROSCOPIC  URINE DRUG SCREEN, QUALITATIVE ([facility] ONLY)  TROPONIN  I (HIGH SENSITIVITY)   ____________________________________________   EKG  Interpreted by me Sinus tachycardia rate 113, left axis, normal intervals.  Poor R wave progression.  Normal ST segments and T waves.  No acute ischemic changes.  No significant change compared to previous EKG November 04, 2017.  ____________________________________________    RADIOLOGY  Dg Chest 2 View  Result Date: 12/19/2018 CLINICAL DATA:  Chest pain, shortness of breath EXAM: CHEST - 2 VIEW COMPARISON:  None. FINDINGS: The heart size and mediastinal contours are within normal limits. Both lungs are clear. The visualized skeletal structures are unremarkable. IMPRESSION: No active cardiopulmonary disease. Electronically Signed   By: Davina Poke M.D.   On: 12/19/2018 16:01    ____________________________________________   PROCEDURES Procedures  ____________________________________________  DIFFERENTIAL DIAGNOSIS   Dehydration, urinary tract infection, pneumonia, COVID-19, hyperthyroidism, pulmonary embolism, anemia, electrolyte abnormality  CLINICAL IMPRESSION / ASSESSMENT AND PLAN / ED COURSE  Medications ordered in the ED: Medications - No data to display  Pertinent labs & imaging results that were available during my care of the patient were reviewed by me and considered in my medical decision making (see chart for details).  Kim Obrien was evaluated in Emergency Department on 12/19/2018 for the symptoms described in  the history of present illness. She was evaluated in the context of the global COVID-19 pandemic, which necessitated consideration that the patient might be at risk for infection with the SARS-CoV-2 virus that causes COVID-19. Institutional protocols and algorithms that pertain to the evaluation of patients at risk for COVID-19 are in a state of rapid change based on information released by regulatory bodies including the CDC and federal and state organizations. These policies and algorithms were followed during the patient's care in the ED.   Patient presents with subacute fatigue and shortness of breath.  Exam is nonfocal and reassuring except for mild tachycardia.  Labs are all unremarkable, including normal thyroid function, d-dimer within normal range, chemistry and CBC all unremarkable with stable CKD.  Coronavirus test sent to [facility].  Patient counseled to quarantine at home until result is available.  Urinalysis pending, would start antibiotics as indicated.  Plan for discharge home and outpatient follow-up with primary care.  Patient has been counseled to continue monitoring her symptoms, take her medicines, eat and drink normally, and follow light activity in the meantime.      ____________________________________________   FINAL CLINICAL IMPRESSION(S) / ED DIAGNOSES    Final diagnoses:  Fatigue, unspecified type  Dyspnea, unspecified type     ED Discharge Orders    None      Portions of this note were generated with dragon dictation software. Dictation errors may occur despite best attempts at proofreading.   Carrie Mew, MD 12/19/18 [id number]

## 2018-12-19 NOTE — ED Notes (Signed)
Urine sent to the lab. Pt to be d/c once results are back.

## 2018-12-19 NOTE — ED Triage Notes (Signed)
Says she has felt short of breath since Thursday.  No cough or fever.  Just weak.  No distress.

## 2018-12-19 NOTE — Discharge Instructions (Addendum)
Continue taking your medications at home, sure to eat and drink normally and get a good amount of sleep, and continue monitoring your symptoms.  Please follow-up with your doctor later this week if your symptoms do not resolve.  As we discussed, you have some bacteria in your urine.  It is not clearly an infection, but given your history of UTIs, we are treating it with antibiotics and sent a urine culture to the lab.  Please take the full course of antibiotics.  Your coronavirus test result should be available tomorrow afternoon in my chart.  We will also call you if the result comes back positive.  In the meantime, you should quarantine at home until the result is known.

## 2018-12-19 NOTE — Telephone Encounter (Signed)
Patient called complaining of new onset of shortness of breath since 5 days ago. Shortness of breath seems to have worsened since onset per patient.  Patient also complains of new onset fatigue. Patent denies any cough, headaches, swelling, chest pain, numbness or blurred vision. Patient's breathing sounded labored as I talked to her on the phone. Patient advised me that she felt out of breath just sitting in a chair talking.  I advised patient to go to the ER since her breathing has gotten worse. Patient agreed with this plan and stated to me that her husband would take her to the ER. Patient did not want to call EMS. She states she would rather have her husband take her instead.

## 2018-12-20 LAB — URINALYSIS, COMPLETE (UACMP) WITH MICROSCOPIC
Bilirubin Urine: NEGATIVE
Glucose, UA: NEGATIVE mg/dL
Hgb urine dipstick: NEGATIVE
Ketones, ur: NEGATIVE mg/dL
Leukocytes,Ua: NEGATIVE
Nitrite: NEGATIVE
Protein, ur: NEGATIVE mg/dL
Specific Gravity, Urine: 1.004 — ABNORMAL LOW (ref 1.005–1.030)
pH: 6 (ref 5.0–8.0)

## 2018-12-20 LAB — URINE DRUG SCREEN, QUALITATIVE (ARMC ONLY)
Amphetamines, Ur Screen: NOT DETECTED
Barbiturates, Ur Screen: NOT DETECTED
Benzodiazepine, Ur Scrn: NOT DETECTED
Cannabinoid 50 Ng, Ur ~~LOC~~: NOT DETECTED
Cocaine Metabolite,Ur ~~LOC~~: NOT DETECTED
MDMA (Ecstasy)Ur Screen: NOT DETECTED
Methadone Scn, Ur: NOT DETECTED
Opiate, Ur Screen: NOT DETECTED
Phencyclidine (PCP) Ur S: NOT DETECTED
Tricyclic, Ur Screen: NOT DETECTED

## 2018-12-20 MED ORDER — CEPHALEXIN 500 MG PO CAPS: 500.0000 mg | ORAL_CAPSULE | Freq: Two times a day (BID) | ORAL | 0 refills | Status: DC

## 2018-12-20 MED ORDER — CEPHALEXIN 500 MG PO CAPS
500.0000 mg | ORAL_CAPSULE | Freq: Once | ORAL | Status: AC
  Administered 2018-12-20: 500 mg via ORAL
  Filled 2018-12-20: qty 1

## 2018-12-20 NOTE — ED Provider Notes (Signed)
-----------------------------------------   12:40 AM on 12/20/2018 -----------------------------------------  Assuming care from Dr. Joni Fears.  In short, Kim Obrien is a 53 y.o. female with a chief complaint of shortness of breath.  Refer to the original H&P for additional details.  The plan was to await the results of the urinalysis.  The results are somewhat equivocal; she has many bacteria seen but not an obvious sign of infection.  I touch base with the patient and she says she does have a history of frequent urinary tract infections and would prefer to be treated empirically so I started treatment with Keflex 500 mg by mouth and a prescription for outpatient treatment.  I reiterated the importance of outpatient follow-up.  I also sent a urine culture.  I gave my usual and customary return precautions.   Hinda Kehr, MD 12/20/18 [phone]

## 2018-12-21 LAB — NOVEL CORONAVIRUS, NAA (HOSP ORDER, SEND-OUT TO REF LAB; TAT 18-24 HRS): SARS-CoV-2, NAA: NOT DETECTED

## 2018-12-22 LAB — URINE CULTURE
Culture: >=100000 — AB
Special Requests: NORMAL

## 2018-12-25 ENCOUNTER — Other Ambulatory Visit: Payer: Self-pay | Admitting: Family Medicine

## 2018-12-25 NOTE — Telephone Encounter (Signed)
Pt has had shortness of breath - diarrhea - vomiting.  Went to ER and got a covid test with neg results.  Was told to check back with PCP.  She also has had gout over the weekend but cannot take: indomethacin (INDOCIN) 50 MG capsule and prednisone.  Wanting to know if there is something else that can be prescribed?  Please advise.  Thanks, [employer]

## 2018-12-25 NOTE — Telephone Encounter (Signed)
Please advise. Does patient just need a f/u office visit? If so, Is it ok for me to schedule an in office appointment, or should it be virtual?

## 2019-01-02 NOTE — Telephone Encounter (Signed)
Can send colchcine prescription to pharmacy of her choice for gout.  GI symptoms sound like viral gastroenteritis. If still having diarrhea we can order stool studies.

## 2019-01-04 ENCOUNTER — Ambulatory Visit (INDEPENDENT_AMBULATORY_CARE_PROVIDER_SITE_OTHER): Payer: Self-pay | Admitting: Physician Assistant

## 2019-01-04 ENCOUNTER — Encounter: Payer: Self-pay | Admitting: Physician Assistant

## 2019-01-04 ENCOUNTER — Other Ambulatory Visit: Payer: Self-pay

## 2019-01-04 VITALS — BP 117/78 | HR 114 | Temp 97.1°F | Resp 16 | Wt 199.0 lb

## 2019-01-04 DIAGNOSIS — F3178 Bipolar disorder, in full remission, most recent episode mixed: Secondary | ICD-10-CM

## 2019-01-04 DIAGNOSIS — N3090 Cystitis, unspecified without hematuria: Secondary | ICD-10-CM

## 2019-01-04 DIAGNOSIS — M79672 Pain in left foot: Secondary | ICD-10-CM

## 2019-01-04 LAB — POCT URINALYSIS DIPSTICK
Bilirubin, UA: NEGATIVE
Glucose, UA: NEGATIVE
Ketones, UA: NEGATIVE
Leukocytes, UA: NEGATIVE
Nitrite, UA: NEGATIVE
Protein, UA: NEGATIVE
Spec Grav, UA: 1.01 (ref 1.010–1.025)
Urobilinogen, UA: 0.2 E.U./dL
pH, UA: 6 (ref 5.0–8.0)

## 2019-01-04 MED ORDER — CEPHALEXIN 500 MG PO CAPS: 500.0000 mg | ORAL_CAPSULE | Freq: Two times a day (BID) | ORAL | 0 refills | Status: AC

## 2019-01-04 MED ORDER — QUETIAPINE FUMARATE 200 MG PO TABS: 200.0000 mg | ORAL_TABLET | Freq: Two times a day (BID) | ORAL | 0 refills | Status: DC

## 2019-01-04 MED ORDER — PREDNISONE 20 MG PO TABS: 20.0000 mg | ORAL_TABLET | Freq: Every day | ORAL | 0 refills | Status: AC

## 2019-01-04 NOTE — Progress Notes (Signed)
Patient: Kim Obrien Female    DOB: Jun 16, 1965   53 y.o.   MRN: [id number] Visit Date: 01/04/2019  Today's Provider: Trinna Post, PA-C   Chief Complaint  Patient presents with  . Flank Pain   Subjective:   Patient is a 53 y/o woman with history of left ovarian mass presenting today with bilateral flank pain. This has been occurring for several days. She was seen in the ER on 12/19/2018 for flank pain and SOB. CXR normal. COVID negative. Urine culture grew Klebseilla pneumonia which was sensitive to keflex. She was treated with keflex 500 mg BID x 7 days. She reports the flank pain resolved after this but then returned. She denies fevers, chills, nausea, vomiting, dysuria. Left ovarian mass was removed last year and pathology was benign.   Flank Pain This is a new problem. The current episode started in the past 7 days. The problem occurs intermittently. The problem is unchanged. The quality of the pain is described as aching. The pain does not radiate. The pain is at a severity of 7/10. The pain is moderate. The pain is the same all the time. Pertinent negatives include no abdominal pain, bladder incontinence, bowel incontinence or dysuria. She has tried nothing for the symptoms.   She is also reporting left foot pain in her heel. She thinks it is gout. It hurts her all day and hurts worse at the end of the day.   She has a history of bipolar depression. She was previously on lithium but is not taking this any longer as she has been switched to seroquel. She is requesting a refill of seroquel today.   No Known Allergies   Current Outpatient Medications:  .  allopurinol (ZYLOPRIM) 100 MG tablet, TAKE 1 TABLET BY MOUTH EVERY DAY, Disp: 30 tablet, Rfl: 3 .  clonazePAM (KLONOPIN) 0.5 MG tablet, Take 1 tablet (0.5 mg total) by mouth 2 (two) times daily as needed for anxiety., Disp: 20 tablet, Rfl: 3 .  ondansetron (ZOFRAN) 4 MG tablet, Take 1 tablet (4 mg total) by mouth every 8  (eight) hours as needed for nausea or vomiting., Disp: 20 tablet, Rfl: 0 .  QUEtiapine (SEROQUEL) 200 MG tablet, Take 1 tablet (200 mg total) by mouth 2 (two) times daily., Disp: 60 tablet, Rfl: 0 .  spironolactone (ALDACTONE) 50 MG tablet, TAKE 1 TABLET BY MOUTH TWICE A DAY, Disp: 60 tablet, Rfl: 5 .  indomethacin (INDOCIN) 50 MG capsule, TAKE ONE CAPSULE BY MOUTH EVERY 8 HOURS AS NEEDED (Patient not taking: Reported on 01/04/2019), Disp: 30 capsule, Rfl: 5 .  lithium carbonate 300 MG capsule, Take 2 capsules (600 mg total) by mouth at bedtime. (Patient not taking: Reported on 01/04/2019), Disp: 60 capsule, Rfl: 3  Review of Systems  Constitutional: Negative.   Respiratory: Negative.   Cardiovascular: Negative.   Gastrointestinal: Negative for abdominal pain and bowel incontinence.  Genitourinary: Positive for flank pain. Negative for bladder incontinence and dysuria.  Musculoskeletal: Positive for myalgias.    Social History   Tobacco Use  . Smoking status: Never Smoker  . Smokeless tobacco: Never Used  Substance Use Topics  . Alcohol use: No    Alcohol/week: 0.0 standard drinks      Objective:   BP 117/78 (BP Location: Left Arm, Patient Position: Sitting, Cuff Size: Large)   Pulse (!) 114   Temp (!) 97.1 F (36.2 C) (Temporal)   Resp 16   Wt 199 lb (90.3  kg)   BMI 35.25 kg/m  Vitals:   01/04/19 1105  BP: 117/78  Pulse: (!) 114  Resp: 16  Temp: (!) 97.1 F (36.2 C)  TempSrc: Temporal  Weight: 199 lb (90.3 kg)  Body mass index is 35.25 kg/m.   Physical Exam Constitutional:      Appearance: Normal appearance.  Cardiovascular:     Rate and Rhythm: Normal rate and regular rhythm.     Heart sounds: Normal heart sounds.  Pulmonary:     Effort: Pulmonary effort is normal.     Breath sounds: Normal breath sounds.  Abdominal:     General: Bowel sounds are normal.     Palpations: Abdomen is soft.     Tenderness: There is no abdominal tenderness. There is no right CVA  tenderness or left CVA tenderness.  Musculoskeletal:       Feet:  Skin:    General: Skin is warm and dry.  Neurological:     Mental Status: She is alert and oriented to person, place, and time. Mental status is at baseline.  Psychiatric:        Mood and Affect: Mood normal.        Behavior: Behavior normal.      No results found for any visits on 01/04/19.     Assessment & Plan    1. Cystitis  Exam is benign, will treat again for cystitis. Possibly reinfection.   - cephALEXin (KEFLEX) 500 MG capsule; Take 1 capsule (500 mg total) by mouth 2 (two) times daily for 7 days.  Dispense: 14 capsule; Refill: 0 - POCT urinalysis dipstick - Urine Culture  2. Left foot pain  Counseled that her left heel pain is unlikely to be gout, it has been going on for three weeks and is nontender to deep palpation. It is also not in a join. Possibly some plantar fasciitis. Will avoid NSAIDs due to CKD and give small prednisone course.   - predniSONE (DELTASONE) 20 MG tablet; Take 1 tablet (20 mg total) by mouth daily with breakfast for 5 days.  Dispense: 5 tablet; Refill: 0  3. Bipolar disorder, in full remission, most recent episode mixed (HCC)  - QUEtiapine (SEROQUEL) 200 MG tablet; Take 1 tablet (200 mg total) by mouth 2 (two) times daily.  Dispense: 60 tablet; Refill: 0  The entirety of the information documented in the History of Present Illness, Review of Systems and Physical Exam were personally obtained by me. Portions of this information were initially documented by Lynford Humphrey, CMA and reviewed by me for thoroughness and accuracy.   F/u PRN     Trinna Post, PA-C  [facility]

## 2019-01-05 NOTE — Patient Instructions (Signed)

## 2019-01-09 ENCOUNTER — Encounter: Payer: Self-pay | Admitting: Physician Assistant

## 2019-01-09 LAB — URINE CULTURE

## 2019-01-09 MED ORDER — SULFAMETHOXAZOLE-TRIMETHOPRIM 800-160 MG PO TABS: 1.0000 | ORAL_TABLET | Freq: Two times a day (BID) | ORAL | 0 refills | Status: AC

## 2019-01-10 ENCOUNTER — Telehealth: Payer: Self-pay

## 2019-01-10 NOTE — Telephone Encounter (Signed)
Patient reports that Dr. Caryn Section sent in antibiotic yesterday.

## 2019-01-10 NOTE — Telephone Encounter (Signed)
-----   Message from Trinna Post, [place] sent at 01/09/2019  1:41 PM EDT ----- Her urine culture grew out a different bacteria prior that is resistant to the antibiotic I gave. How is she feeling? If she is still having symptoms I will need to send her in a different antibiotic. I will probably send her bactrim.

## 2019-01-19 NOTE — Telephone Encounter (Signed)
Called and spoke with patient and she states that she did not request refill on this script and states she has not had a gout flare for some time now. Please disregard refill request at this time it is not needed. KW

## 2019-01-30 ENCOUNTER — Other Ambulatory Visit: Payer: Self-pay | Admitting: Family Medicine

## 2019-01-30 DIAGNOSIS — M109 Gout, unspecified: Secondary | ICD-10-CM

## 2019-02-04 ENCOUNTER — Encounter: Payer: Self-pay | Admitting: Family Medicine

## 2019-02-04 DIAGNOSIS — F3178 Bipolar disorder, in full remission, most recent episode mixed: Secondary | ICD-10-CM

## 2019-02-05 MED ORDER — QUETIAPINE FUMARATE 200 MG PO TABS: 200.0000 mg | ORAL_TABLET | Freq: Two times a day (BID) | ORAL | 2 refills | Status: DC

## 2019-02-26 ENCOUNTER — Other Ambulatory Visit: Payer: Self-pay | Admitting: Family Medicine

## 2019-02-26 DIAGNOSIS — R030 Elevated blood-pressure reading, without diagnosis of hypertension: Secondary | ICD-10-CM

## 2019-03-05 ENCOUNTER — Encounter: Payer: Self-pay | Admitting: Family Medicine

## 2019-03-06 ENCOUNTER — Other Ambulatory Visit: Payer: Self-pay

## 2019-03-06 DIAGNOSIS — F3178 Bipolar disorder, in full remission, most recent episode mixed: Secondary | ICD-10-CM

## 2019-03-06 MED ORDER — QUETIAPINE FUMARATE 200 MG PO TABS: 200.0000 mg | ORAL_TABLET | Freq: Two times a day (BID) | ORAL | 5 refills | Status: DC

## 2019-03-06 NOTE — Telephone Encounter (Signed)
LOV 01/04/2019 and last RF 02/05/2019

## 2019-04-02 ENCOUNTER — Encounter: Payer: Self-pay | Admitting: Family Medicine

## 2019-04-03 ENCOUNTER — Other Ambulatory Visit: Payer: Self-pay

## 2019-04-03 DIAGNOSIS — F3178 Bipolar disorder, in full remission, most recent episode mixed: Secondary | ICD-10-CM

## 2019-04-03 MED ORDER — QUETIAPINE FUMARATE 200 MG PO TABS: 200.0000 mg | ORAL_TABLET | Freq: Two times a day (BID) | ORAL | 5 refills | Status: DC

## 2019-04-03 NOTE — Telephone Encounter (Signed)
Last RF 03/06/2019 LOV 01/04/2019

## 2019-05-10 DIAGNOSIS — N83519 Torsion of ovary and ovarian pedicle, unspecified side: Secondary | ICD-10-CM

## 2019-05-29 ENCOUNTER — Other Ambulatory Visit: Payer: Self-pay | Admitting: Family Medicine

## 2019-05-29 DIAGNOSIS — R030 Elevated blood-pressure reading, without diagnosis of hypertension: Secondary | ICD-10-CM

## 2019-06-15 ENCOUNTER — Encounter: Payer: Self-pay | Admitting: Family Medicine

## 2019-06-15 ENCOUNTER — Ambulatory Visit (INDEPENDENT_AMBULATORY_CARE_PROVIDER_SITE_OTHER): Payer: Self-pay | Admitting: Family Medicine

## 2019-06-15 ENCOUNTER — Other Ambulatory Visit: Payer: Self-pay

## 2019-06-15 VITALS — BP 129/81 | HR 103 | Temp 96.8°F | Wt 201.6 lb

## 2019-06-15 DIAGNOSIS — R109 Unspecified abdominal pain: Secondary | ICD-10-CM

## 2019-06-15 DIAGNOSIS — K760 Fatty (change of) liver, not elsewhere classified: Secondary | ICD-10-CM

## 2019-06-15 DIAGNOSIS — K589 Irritable bowel syndrome without diarrhea: Secondary | ICD-10-CM

## 2019-06-15 MED ORDER — DICYCLOMINE HCL 10 MG PO CAPS: 10.0000 mg | ORAL_CAPSULE | Freq: Three times a day (TID) | ORAL | 3 refills | Status: DC

## 2019-06-15 MED ORDER — ONDANSETRON HCL 4 MG PO TABS: 4.0000 mg | ORAL_TABLET | Freq: Three times a day (TID) | ORAL | 3 refills | Status: DC | PRN

## 2019-06-15 NOTE — Patient Instructions (Signed)
Irritable Bowel Syndrome, Adult  Irritable bowel syndrome (IBS) is a group of symptoms that affects the organs responsible for digestion (gastrointestinal or GI tract). IBS is not one specific disease. To regulate how the GI tract works, the body sends signals back and forth between the intestines and the brain. If you have IBS, there may be a problem with these signals. As a result, the GI tract does not function normally. The intestines may become more sensitive and overreact to certain things. This may be especially true when you eat certain foods or when you are under stress. There are four types of IBS. These may be determined based on the consistency of your stool (feces): IBS with diarrhea. IBS with constipation. Mixed IBS. Unsubtyped IBS. It is important to know which type of IBS you have. Certain treatments are more likely to be helpful for certain types of IBS. What are the causes? The exact cause of IBS is not known. What increases the risk? You may have a higher risk for IBS if you: Are female. Are younger than 48. Have a family history of IBS. Have a mental health condition, such as depression, anxiety, or post-traumatic stress disorder. Have had a bacterial infection of your GI tract. What are the signs or symptoms? Symptoms of IBS vary from person to person. The main symptom is abdominal pain or discomfort. Other symptoms usually include one or more of the following: Diarrhea, constipation, or both. Abdominal swelling or bloating. Feeling full after eating a small or regular-sized meal. Frequent gas. Mucus in the stool. A feeling of having more stool left after a bowel movement. Symptoms tend to come and go. They may be triggered by stress, mental health conditions, or certain foods. How is this diagnosed? This condition may be diagnosed based on a physical exam, your medical history, and your symptoms. You may have tests, such as: Blood tests. Stool test. X-rays. CT  scan. Colonoscopy. This is a procedure in which your GI tract is viewed with a long, thin, flexible tube. How is this treated? There is no cure for IBS, but treatment can help relieve symptoms. Treatment depends on the type of IBS you have, and may include: Changes to your diet, such as: Avoiding foods that cause symptoms. Drinking more water. Following a low-FODMAP (fermentable oligosaccharides, disaccharides, monosaccharides, and polyols) diet for up to 6 weeks, or as told by your health care provider. FODMAPs are sugars that are hard for some people to digest. Eating more fiber. Eating medium-sized meals at the same times every day. Medicines. These may include: Fiber supplements, if you have constipation. Medicine to control diarrhea (antidiarrheal medicines). Medicine to help control muscle tightening (spasms) in your GI tract (antispasmodic medicines). Medicines to help with mental health conditions, such as antidepressants or tranquilizers. Talk therapy or counseling. Working with a diet and nutrition specialist (dietitian) to help create a food plan that is right for you. Managing your stress. Follow these instructions at home: Eating and drinking Eat a healthy diet. Eat medium-sized meals at about the same time every day. Do not eat large meals. Gradually eat more fiber-rich foods. These include whole grains, fruits, and vegetables. This may be especially helpful if you have IBS with constipation. Eat a diet low in FODMAPs. Drink enough fluid to keep your urine pale yellow. Keep a journal of foods that seem to trigger symptoms. Avoid foods and drinks that: Contain added sugar. Make your symptoms worse. Dairy products, caffeinated drinks, and carbonated drinks can make symptoms  worse for some people. General instructions Take over-the-counter and prescription medicines and supplements only as told by your health care provider. Get enough exercise. Do at least 150 minutes of  moderate-intensity exercise each week. Manage your stress. Getting enough sleep and exercise can help you manage stress. Keep all follow-up visits as told by your health care provider and therapist. This is important. Alcohol Use Do not drink alcohol if: Your health care provider tells you not to drink. You are pregnant, may be pregnant, or are planning to become pregnant. If you drink alcohol, limit how much you have: 0-1 drink a day for women. 0-2 drinks a day for men. Be aware of how much alcohol is in your drink. In the [place] one drink equals one typical bottle of beer (12 oz), one-half glass of wine (5 oz), or one shot of hard liquor (1 oz). Contact a health care provider if you have: Constant pain. Weight loss. Difficulty or pain when swallowing. Diarrhea that gets worse. Get help right away if you have: Severe abdominal pain. Fever. Diarrhea with symptoms of dehydration, such as dizziness or dry mouth. Bright red blood in your stool. Stool that is black and tarry. Abdominal swelling. Vomiting that does not stop. Blood in your vomit. Summary Irritable bowel syndrome (IBS) is not one specific disease. It is a group of symptoms that affects digestion. Your intestines may become more sensitive and overreact to certain things. This may be especially true when you eat certain foods or when you are under stress. There is no cure for IBS, but treatment can help relieve symptoms. This information is not intended to replace advice given to you by your health care provider. Make sure you discuss any questions you have with your health care provider. Document Revised: 03/22/2017 Document Reviewed: 03/22/2017 Elsevier Patient Education  [employer]. . Please review the attached list of medications and notify my office if there are any errors.   . Please bring all of your medications to every appointment so we can make sure that our medication list is the same as yours.

## 2019-06-15 NOTE — Progress Notes (Signed)
Patient: Kim Obrien Female    DOB: [birth year]   54 y.o.   MRN: [id number] Visit Date: 06/15/2019  Today's Provider: Lelon Huh, MD   Chief Complaint  Patient presents with  . GI Problem   Subjective:     GI Problem The primary symptoms include fatigue, abdominal pain, vomiting and diarrhea. Primary symptoms do not include fever. The illness began more than 7 days ago.  The illness is also significant for bloating.  Is usually after eating. Stomach pains after eating. Alternates between and constipation.  Patient states that Zofran has been helping but she is almost out of it.   No Known Allergies   Current Outpatient Medications:  .  allopurinol (ZYLOPRIM) 100 MG tablet, TAKE 1 TABLET BY MOUTH EVERY DAY, Disp: 30 tablet, Rfl: 12 .  ondansetron (ZOFRAN) 4 MG tablet, Take 1 tablet (4 mg total) by mouth every 8 (eight) hours as needed for nausea or vomiting., Disp: 20 tablet, Rfl: 0 .  QUEtiapine (SEROQUEL) 200 MG tablet, Take 1 tablet (200 mg total) by mouth 2 (two) times daily., Disp: 60 tablet, Rfl: 5 .  spironolactone (ALDACTONE) 50 MG tablet, TAKE 1 TABLET BY MOUTH TWICE A DAY, Disp: 60 tablet, Rfl: 2 .  clonazePAM (KLONOPIN) 0.5 MG tablet, Take 1 tablet (0.5 mg total) by mouth 2 (two) times daily as needed for anxiety. (Patient not taking: Reported on 06/15/2019), Disp: 20 tablet, Rfl: 3  Review of Systems  Constitutional: Positive for fatigue. Negative for fever.  Respiratory: Negative.   Cardiovascular: Negative.   Gastrointestinal: Positive for abdominal distention, abdominal pain, bloating, diarrhea and vomiting.  Musculoskeletal: Negative.     Social History   Tobacco Use  . Smoking status: Never Smoker  . Smokeless tobacco: Never Used  Substance Use Topics  . Alcohol use: No    Alcohol/week: 0.0 standard drinks      Objective:   BP 129/81 (BP Location: Right Arm, Patient Position: Sitting, Cuff Size: Large)   Pulse (!) 103   Temp (!) 96.8 F  (36 C) (Temporal)   Wt 201 lb 9.6 oz (91.4 kg)   BMI 35.71 kg/m  Vitals:   06/15/19 0935  BP: 129/81  Pulse: (!) 103  Temp: (!) 96.8 F (36 C)  TempSrc: Temporal  Weight: 201 lb 9.6 oz (91.4 kg)  Body mass index is 35.71 kg/m.   Physical Exam   General: Appearance:    Obese female in no acute distress  Eyes:    PERRL, conjunctiva/corneas clear, EOM's intact       Lungs:     Clear to auscultation bilaterally, respirations unlabored  Heart:    Tachycardic. Normal rhythm. No murmurs, rubs, or gallops.   MS:   All extremities are intact.   Neurologic:   Awake, alert, oriented x 3. No apparent focal neurological           defect.          Assessment & Plan    1. Abdominal pain, unspecified abdominal location  - US Abdomen Complete; Future  2. Fatty liver disease, nonalcoholic  - US Abdomen Complete; Future  3. Irritable bowel syndrome without diarrhea  - dicyclomine (BENTYL) 10 MG capsule; Take 1 capsule (10 mg total) by mouth 4 (four) times daily -  before meals and at bedtime.  Dispense: 60 capsule; Refill: 3  Consider GI referral after reviewing labs.     Lelon Huh, MD  [facility]  [place]

## 2019-06-18 ENCOUNTER — Encounter: Payer: Self-pay | Admitting: Family Medicine

## 2019-06-21 ENCOUNTER — Ambulatory Visit: Payer: Self-pay

## 2019-06-22 ENCOUNTER — Other Ambulatory Visit: Payer: Self-pay

## 2019-06-22 ENCOUNTER — Encounter: Payer: Self-pay | Admitting: Family Medicine

## 2019-06-22 ENCOUNTER — Ambulatory Visit
Admission: RE | Admit: 2019-06-22 | Discharge: 2019-06-22 | Disposition: A | Discharge status: Home / Self Care | Payer: Self-pay | Source: Ambulatory Visit | Attending: Family Medicine | Admitting: Family Medicine

## 2019-06-22 DIAGNOSIS — K760 Fatty (change of) liver, not elsewhere classified: Secondary | ICD-10-CM | POA: Insufficient documentation

## 2019-06-22 DIAGNOSIS — K589 Irritable bowel syndrome without diarrhea: Secondary | ICD-10-CM | POA: Insufficient documentation

## 2019-06-22 DIAGNOSIS — R109 Unspecified abdominal pain: Secondary | ICD-10-CM | POA: Insufficient documentation

## 2019-08-13 LAB — BASIC METABOLIC PANEL
BUN: 17 (ref 4–21)
Creatinine: 1.5 — AB (ref 0.5–1.1)
Glucose: 335
Potassium: 4.5 (ref 3.4–5.3)
Sodium: 136 — AB (ref 137–147)

## 2019-08-13 LAB — COMPREHENSIVE METABOLIC PANEL
Albumin: 4.2 (ref 3.5–5.0)
Calcium: 9.6 (ref 8.7–10.7)
GFR calc Af Amer: 44
GFR calc non Af Amer: 38

## 2019-08-13 LAB — HEMOGLOBIN A1C: Hemoglobin A1C: 9.9

## 2019-08-13 LAB — PTH, INTACT: PTH, Intact: 51

## 2019-08-13 LAB — CBC: RBC: 44.5 — AB (ref 3.87–5.11)

## 2019-08-13 LAB — PHOSPHORUS: Phosphorus: 3

## 2019-08-13 LAB — CBC AND DIFFERENTIAL
HCT: 41 (ref 36–46)
Hemoglobin: 13.4 (ref 12.0–16.0)
Platelets: 218 (ref 150–399)

## 2019-08-14 NOTE — Progress Notes (Signed)
Complete physical exam   Patient: Kim Obrien   DOB: [birth year]   54 y.o. Female  MRN: [id number] Visit Date: 08/15/2019  Today's healthcare provider: Lelon Huh, MD   Chief Complaint  Patient presents with  . Manic Behavior  . Hypertension  . Diabetes   Subjective     HPI  Follow up for Bipolar disorder:  The patient was last seen for this more than 1 year ago. Changes made at last visit include none.  She reports good compliance with treatment. She feels that condition is Unchanged. She is not having side effects.   -----------------------------------------------------------------------------------------  Follow up for Fatty Liver:  The patient was last seen for this 2 months ago. Changes made at last visit include ordering an abdominal ultrasound. Patient was advised to consider getting Hepatitis A and B vaccines.  She reports good compliance with treatment. She feels that condition is Unchanged. She is not having side effects.   -----------------------------------------------------------------------------------------  Hypertension, follow-up  BP Readings from Last 3 Encounters:  08/15/19 111/74  06/15/19 129/81  01/04/19 117/78   Wt Readings from Last 3 Encounters:  08/15/19 199 lb (90.3 kg)  06/15/19 201 lb 9.6 oz (91.4 kg)  01/04/19 199 lb (90.3 kg)     She was last seen for hypertension 1 years ago.  BP at that visit was 121/81. Management since that visit includes continuing Spironolactone.  She reports good compliance with treatment. She is not having side effects.  She is following a Regular diet. She is exercising. She does not smoke.  Use of agents associated with hypertension: none.   Outside blood pressures are 130/84. Symptoms: No chest pain No chest pressure No palpitations No dyspnea No orthopnea No paroxysmal nocturnal dyspnea No lower extremity edema No syncope   Pertinent labs: No results found for: CHOL, HDL,  LDLCALC, LDLDIRECT, TRIG, CHOLHDL Lab Results  Component Value Date   NA 139 12/19/2018   K 4.0 12/19/2018   CO2 20 (L) 12/19/2018   GLUCOSE 238 (H) 12/19/2018   BUN 14 12/19/2018   CREATININE 1.90 (H) 12/19/2018   CALCIUM 9.4 12/19/2018   GFRNONAA 30 (L) 12/19/2018   GFRAA 35 (L) 12/19/2018     The ASCVD Risk score Mikey Bussing DC Jr., et al., 2013) failed to calculate for the following reasons:   Cannot find a previous HDL lab   Cannot find a previous total cholesterol lab   --------------------------------------------------------------------------------------------------- Diabetes: Patient recent A1C was 9.9 checked by Nephrology on 08/13/2019.She started glipizide a few days ago which she states she is tolerating well.   Past Medical History:  Diagnosis Date  . Arthritis   . Bipolar 1 disorder ([place])   . History of chicken pox    Past Surgical History:  Procedure Laterality Date  . LAPAROSCOPY Left 11/04/2017   Procedure: operative laparoscopy, aspiration of left ovarian cyst, left salpingo-oopherectomy;  Surgeon: Will Bonnet, MD;  Location: [facility] ORS;  Service: Gynecology;  Laterality: Left;  . pap smear  2008   in [place]  . partial wisdom tooth extraction  2004   Social History   Socioeconomic History  . Marital status: Married    Spouse name: Not on file  . Number of children: 2  . Years of education: Not on file  . Highest education level: Not on file  Occupational History  . Not on file  Tobacco Use  . Smoking status: Never Smoker  . Smokeless tobacco: Never Used  Substance and Sexual Activity  .  Alcohol use: No    Alcohol/week: 0.0 standard drinks  . Drug use: No  . Sexual activity: Not on file  Other Topics Concern  . Not on file  Social History Narrative  . Not on file   Social Determinants of Health   Financial Resource Strain:   . Difficulty of Paying Living Expenses:   Food Insecurity:   . Worried About Charity fundraiser in the Last Year:    . Arboriculturist in the Last Year:   Transportation Needs:   . Film/video editor (Medical):   Marland Kitchen Lack of Transportation (Non-Medical):   Physical Activity:   . Days of Exercise per Week:   . Minutes of Exercise per Session:   Stress:   . Feeling of Stress :   Social Connections:   . Frequency of Communication with Friends and Family:   . Frequency of Social Gatherings with Friends and Family:   . Attends Religious Services:   . Active Member of Clubs or Organizations:   . Attends Archivist Meetings:   Marland Kitchen Marital Status:   Intimate Partner Violence:   . Fear of Current or Ex-Partner:   . Emotionally Abused:   Marland Kitchen Physically Abused:   . Sexually Abused:    Family Status  Relation Name Status  . Mother  Alive       substance abuse  . Father  Alive  . Sister  Alive  . Daughter  Alive  . Daughter  Alive   Family History  Problem Relation Age of Onset  . Diabetes Mother        type 1  . Heart disease Mother   . Depression Mother   . Anxiety disorder Mother   . Prostate cancer Father   . Bipolar disorder Father   . Gout Father   . Hypertension Father   . Anxiety disorder Sister    No Known Allergies  Patient Care Team: Birdie Sons, MD as PCP - General (Family Medicine) Anthonette Legato, MD (Internal Medicine)   Medications: Outpatient Medications Prior to Visit  Medication Sig  . allopurinol (ZYLOPRIM) 100 MG tablet TAKE 1 TABLET BY MOUTH EVERY DAY  . clonazePAM (KLONOPIN) 0.5 MG tablet Take 1 tablet (0.5 mg total) by mouth 2 (two) times daily as needed for anxiety.  . dicyclomine (BENTYL) 10 MG capsule Take 1 capsule (10 mg total) by mouth 4 (four) times daily -  before meals and at bedtime.  Marland Kitchen glipiZIDE (GLUCOTROL XL) 5 MG 24 hr tablet Take 1 tablet by mouth daily.  . ondansetron (ZOFRAN) 4 MG tablet Take 1 tablet (4 mg total) by mouth every 8 (eight) hours as needed for nausea or vomiting.  Marland Kitchen QUEtiapine (SEROQUEL) 200 MG tablet Take 1 tablet  (200 mg total) by mouth 2 (two) times daily.  . sodium bicarbonate 650 MG tablet Take 2 tablets by mouth in the morning and at bedtime.  Marland Kitchen spironolactone (ALDACTONE) 50 MG tablet TAKE 1 TABLET BY MOUTH TWICE A DAY   No facility-administered medications prior to visit.    Review of Systems  Constitutional: Negative for chills, fatigue and fever.  HENT: Negative for congestion, ear pain, rhinorrhea, sneezing and sore throat.   Eyes: Negative.  Negative for pain and redness.  Respiratory: Negative for cough, shortness of breath and wheezing.   Cardiovascular: Negative for chest pain and leg swelling.  Gastrointestinal: Negative for abdominal pain, blood in stool, constipation, diarrhea and nausea.  Endocrine: Negative for  polydipsia and polyphagia.  Genitourinary: Negative.  Negative for dysuria, flank pain, hematuria, pelvic pain, vaginal bleeding and vaginal discharge.  Musculoskeletal: Negative for arthralgias, back pain, gait problem and joint swelling.  Skin: Negative for rash.  Neurological: Negative.  Negative for dizziness, tremors, seizures, weakness, light-headedness, numbness and headaches.  Hematological: Negative for adenopathy.  Psychiatric/Behavioral: Negative.  Negative for behavioral problems, confusion and dysphoric mood. The patient is not nervous/anxious and is not hyperactive.       Objective    BP 111/74 (BP Location: Left Arm, Cuff Size: Large)   Pulse 99   Temp (!) 97.1 F (36.2 C) (Temporal)   Resp 16   Ht 5\' 4"  (1.626 m)   Wt 199 lb (90.3 kg)   LMP  (Within Weeks) Comment: 3 weeks ago  SpO2 97% Comment: room air  BMI 34.16 kg/m    Physical Exam    General Appearance:    Obese female. Alert, cooperative, in no acute distress, appears stated age   Head:    Normocephalic, without obvious abnormality, atraumatic  Eyes:    PERRL, conjunctiva/corneas clear, EOM's intact, fundi    benign, both eyes  Ears:    Normal TM's and external ear canals, both ears    Nose:   Nares normal, septum midline, mucosa normal, no drainage    or sinus tenderness  Throat:   Lips, mucosa, and tongue normal; teeth and gums normal  Neck:   Supple, symmetrical, trachea midline, no adenopathy;    thyroid:  no enlargement/tenderness/nodules; no carotid   bruit or JVD  Back:     Symmetric, no curvature, ROM normal, no CVA tenderness  Lungs:     Clear to auscultation bilaterally, respirations unlabored  Chest Wall:    No tenderness or deformity   Heart:    Normal heart rate. Normal rhythm. No murmurs, rubs, or gallops.   Breast Exam:    deferred  Abdomen:     Soft, non-tender, bowel sounds active all four quadrants,    no masses, no organomegaly  Pelvic:    deferred  Extremities:   All extremities are intact. No cyanosis or edema  Pulses:   2+ and symmetric all extremities  Skin:   Skin color, texture, turgor normal, no rashes or lesions  Lymph nodes:   Cervical, supraclavicular, and axillary nodes normal  Neurologic:   CNII-XII intact, normal strength, sensation and reflexes    throughout      Depression Screen  PHQ 2/9 Scores 08/15/2019 12/05/2017  PHQ - 2 Score 1 2  PHQ- 9 Score 5 8    No results found for any visits on 08/15/19.  Assessment & Plan    Routine Health Maintenance and Physical Exam  Exercise Activities and Dietary recommendations Goals   None     Immunization History  Administered Date(s) Administered  . Moderna SARS-COVID-2 Vaccination 08/08/2019    Health Maintenance  Topic Date Due  . HIV Screening  Never done  . TETANUS/TDAP  Never done  . PAP SMEAR-Modifier  Never done  . MAMMOGRAM  Never done  . COLONOSCOPY  Never done  . COVID-19 Vaccine (2 - Moderna 2-dose series) 09/05/2019  . INFLUENZA VACCINE  11/11/2019    Discussed health benefits of physical activity, and encouraged her to engage in regular exercise appropriate for her age and condition.  1. 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 Plan on following up for A1c in  3 months. Counseled on healthy diet, exercise and losing weight.  - Lipid panel  2. Bipolar disorder, in full remission, most recent episode mixed ([place]) Well controlled with change to Seroquel  She declined routine HM, breast and pelvic exam today due to not having insurance, but anticipating getting on Medicaid.   She would like to see Gyn in a few months and will hopefully have insurance by then. Will contact her this summer about referral to Dr. Glennon Mac who she has seen in the past.    Future Appointments  Date Time Provider [place]  11/19/2019  9:40 AM Caryn Section, Kirstie Peri, MD [facility]        The entirety of the information documented in the History of Present Illness, Review of Systems and Physical Exam were personally obtained by me. Portions of this information were initially documented by the CMA and reviewed by me for thoroughness and accuracy.      Lelon Huh, MD  [facility] [phone] (phone) [phone] (fax)  [place]

## 2019-08-15 ENCOUNTER — Other Ambulatory Visit: Payer: Self-pay

## 2019-08-15 ENCOUNTER — Encounter: Payer: Self-pay | Admitting: Family Medicine

## 2019-08-15 ENCOUNTER — Ambulatory Visit (INDEPENDENT_AMBULATORY_CARE_PROVIDER_SITE_OTHER): Payer: Self-pay | Admitting: Family Medicine

## 2019-08-15 VITALS — BP 111/74 | HR 99 | Temp 97.1°F | Resp 16 | Ht 64.0 in | Wt 199.0 lb

## 2019-08-15 DIAGNOSIS — E1022 Type 1 diabetes mellitus with diabetic chronic kidney disease: Secondary | ICD-10-CM

## 2019-08-15 DIAGNOSIS — F3178 Bipolar disorder, in full remission, most recent episode mixed: Secondary | ICD-10-CM

## 2019-08-15 DIAGNOSIS — E1021 Type 1 diabetes mellitus with diabetic nephropathy: Secondary | ICD-10-CM

## 2019-08-15 DIAGNOSIS — N1832 Chronic kidney disease, stage 3b: Secondary | ICD-10-CM

## 2019-08-16 LAB — LIPID PANEL
Chol/HDL Ratio: 5.6 ratio — ABNORMAL HIGH (ref 0.0–4.4)
Cholesterol, Total: 213 mg/dL — ABNORMAL HIGH (ref 100–199)
HDL: 38 mg/dL — ABNORMAL LOW (ref >39)
LDL Chol Calc (NIH): 126 mg/dL — ABNORMAL HIGH (ref 0–99)
Triglycerides: 276 mg/dL — ABNORMAL HIGH (ref 0–149)
VLDL Cholesterol Cal: 49 mg/dL — ABNORMAL HIGH (ref 5–40)

## 2019-08-17 ENCOUNTER — Other Ambulatory Visit: Payer: Self-pay | Admitting: Family Medicine

## 2019-08-17 DIAGNOSIS — E1169 Type 2 diabetes mellitus with other specified complication: Secondary | ICD-10-CM | POA: Insufficient documentation

## 2019-08-17 DIAGNOSIS — E785 Hyperlipidemia, unspecified: Secondary | ICD-10-CM | POA: Insufficient documentation

## 2019-08-17 MED ORDER — LOVASTATIN 20 MG PO TABS: 20.0000 mg | ORAL_TABLET | Freq: Every day | ORAL | 2 refills | Status: DC

## 2019-08-24 ENCOUNTER — Other Ambulatory Visit: Payer: Self-pay | Admitting: Family Medicine

## 2019-08-24 DIAGNOSIS — R030 Elevated blood-pressure reading, without diagnosis of hypertension: Secondary | ICD-10-CM

## 2019-08-24 NOTE — Telephone Encounter (Signed)
Requested Prescriptions  Pending Prescriptions Disp Refills  . spironolactone (ALDACTONE) 50 MG tablet [Pharmacy Med Name: SPIRONOLACTONE 50 MG TABLET] 60 tablet 2    Sig: TAKE 1 TABLET BY MOUTH TWICE A DAY     Cardiovascular: Diuretics - Aldosterone Antagonist Failed - 08/24/2019  1:20 AM      Failed - Cr in normal range and within 360 days    Creatinine  Date Value Ref Range Status  08/13/2019 1.5 (A) 0.5 - 1.1 Final   Creatinine, Ser  Date Value Ref Range Status  12/19/2018 1.90 (H) 0.44 - 1.00 mg/dL Final         Failed - Na in normal range and within 360 days    Sodium  Date Value Ref Range Status  08/13/2019 136 (A) 137 - 147 Final         Passed - K in normal range and within 360 days    Potassium  Date Value Ref Range Status  08/13/2019 4.5 3.4 - 5.3 Final         Passed - Last BP in normal range    BP Readings from Last 1 Encounters:  08/15/19 111/74         Passed - Valid encounter within last 6 months    Recent Outpatient Visits          1 week ago 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

## 2019-08-31 ENCOUNTER — Ambulatory Visit
Admission: RE | Admit: 2019-08-31 | Discharge: 2019-08-31 | Disposition: A | Discharge status: Home / Self Care | Payer: Disability Insurance | Source: Ambulatory Visit | Attending: Obstetrics and Gynecology | Admitting: Obstetrics and Gynecology

## 2019-08-31 ENCOUNTER — Other Ambulatory Visit: Payer: Self-pay | Admitting: Obstetrics and Gynecology

## 2019-08-31 DIAGNOSIS — M109 Gout, unspecified: Secondary | ICD-10-CM

## 2019-08-31 DIAGNOSIS — M1990 Unspecified osteoarthritis, unspecified site: Secondary | ICD-10-CM

## 2019-10-02 ENCOUNTER — Encounter: Payer: Self-pay | Admitting: Family Medicine

## 2019-10-02 DIAGNOSIS — F3178 Bipolar disorder, in full remission, most recent episode mixed: Secondary | ICD-10-CM

## 2019-10-02 MED ORDER — QUETIAPINE FUMARATE 200 MG PO TABS: 200.0000 mg | ORAL_TABLET | Freq: Two times a day (BID) | ORAL | 5 refills | Status: DC

## 2019-10-28 ENCOUNTER — Other Ambulatory Visit: Payer: Self-pay | Admitting: Family Medicine

## 2019-10-28 DIAGNOSIS — E1169 Type 2 diabetes mellitus with other specified complication: Secondary | ICD-10-CM

## 2019-10-28 NOTE — Telephone Encounter (Signed)
Requested Prescriptions  Pending Prescriptions Disp Refills  . lovastatin (MEVACOR) 20 MG tablet [Pharmacy Med Name: LOVASTATIN 20 MG TABLET] 90 tablet 2    Sig: TAKE 1 TABLET BY MOUTH EVERYDAY AT BEDTIME     Cardiovascular:  Antilipid - Statins Failed - 10/28/2019  9:01 AM      Failed - Total Cholesterol in normal range and within 360 days    Cholesterol, Total  Date Value Ref Range Status  08/15/2019 213 (H) 100 - 199 mg/dL Final         Failed - LDL in normal range and within 360 days    LDL Chol Calc (NIH)  Date Value Ref Range Status  08/15/2019 126 (H) 0 - 99 mg/dL Final         Failed - HDL in normal range and within 360 days    HDL  Date Value Ref Range Status  08/15/2019 38 (L) >39 mg/dL Final         Failed - Triglycerides in normal range and within 360 days    Triglycerides  Date Value Ref Range Status  08/15/2019 276 (H) 0 - 149 mg/dL Final         Passed - Patient is not pregnant      Passed - Valid encounter within last 12 months    Recent Outpatient Visits          2 months ago 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

## 2019-11-17 ENCOUNTER — Other Ambulatory Visit: Payer: Self-pay | Admitting: Family Medicine

## 2019-11-17 DIAGNOSIS — R030 Elevated blood-pressure reading, without diagnosis of hypertension: Secondary | ICD-10-CM

## 2019-11-17 NOTE — Telephone Encounter (Signed)
Requested Prescriptions  Pending Prescriptions Disp Refills  . spironolactone (ALDACTONE) 50 MG tablet [Pharmacy Med Name: SPIRONOLACTONE 50 MG TABLET] 180 tablet 0    Sig: TAKE 1 TABLET BY MOUTH TWICE A DAY     Cardiovascular: Diuretics - Aldosterone Antagonist Failed - 11/17/2019  8:52 AM      Failed - Cr in normal range and within 360 days    Creatinine  Date Value Ref Range Status  08/13/2019 1.5 (A) 0.5 - 1.1 Final   Creatinine, Ser  Date Value Ref Range Status  12/19/2018 1.90 (H) 0.44 - 1.00 mg/dL Final         Failed - Na in normal range and within 360 days    Sodium  Date Value Ref Range Status  08/13/2019 136 (A) 137 - 147 Final         Passed - K in normal range and within 360 days    Potassium  Date Value Ref Range Status  08/13/2019 4.5 3.4 - 5.3 Final         Passed - Last BP in normal range    BP Readings from Last 1 Encounters:  08/15/19 111/74         Passed - Valid encounter within last 6 months    Recent Outpatient Visits          3 months ago 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

## 2019-11-19 ENCOUNTER — Ambulatory Visit: Payer: Medicaid Other | Admitting: Family Medicine

## 2019-11-21 ENCOUNTER — Ambulatory Visit (INDEPENDENT_AMBULATORY_CARE_PROVIDER_SITE_OTHER): Payer: Self-pay | Admitting: Family Medicine

## 2019-11-21 ENCOUNTER — Encounter: Payer: Self-pay | Admitting: Family Medicine

## 2019-11-21 ENCOUNTER — Other Ambulatory Visit: Payer: Self-pay

## 2019-11-21 VITALS — BP 110/80 | HR 88 | Temp 98.1°F | Resp 16 | Wt 203.0 lb

## 2019-11-21 DIAGNOSIS — N1832 Chronic kidney disease, stage 3b: Secondary | ICD-10-CM

## 2019-11-21 DIAGNOSIS — E1121 Type 2 diabetes mellitus with diabetic nephropathy: Secondary | ICD-10-CM

## 2019-11-21 DIAGNOSIS — E1122 Type 2 diabetes mellitus with diabetic chronic kidney disease: Secondary | ICD-10-CM | POA: Insufficient documentation

## 2019-11-21 LAB — POCT GLYCOSYLATED HEMOGLOBIN (HGB A1C)
Est. average glucose Bld gHb Est-mCnc: 192
Hemoglobin A1C: 8.3 % — AB (ref 4.0–5.6)

## 2019-11-21 MED ORDER — METFORMIN HCL 500 MG PO TABS: 500.0000 mg | ORAL_TABLET | Freq: Every evening | ORAL | 3 refills | Status: DC

## 2019-11-21 NOTE — Patient Instructions (Signed)
•   Please review the attached list of medications and notify my office if there are any errors.   It is recommended to engage in 150 minutes of moderate exercise every week.

## 2019-11-21 NOTE — Progress Notes (Signed)
I,Roshena L Chambers,acting as a scribe for Lelon Huh, MD.,have documented all relevant documentation on the behalf of Lelon Huh, MD,as directed by  Lelon Huh, MD while in the presence of Lelon Huh, MD.  Established patient visit   Patient: Kim Obrien   DOB: [birth year]   54 y.o. Female  MRN: [id number] Visit Date: 11/21/2019  Today's healthcare provider: Lelon Huh, MD   Chief Complaint  Patient presents with  . Diabetes   Subjective    HPI  Diabetes Mellitus Type I, Follow-up  Lab Results  Component Value Date   HGBA1C 8.3 (A) 11/21/2019   HGBA1C 9.9 08/13/2019   Wt Readings from Last 3 Encounters:  11/21/19 203 lb (92.1 kg)  08/15/19 199 lb (90.3 kg)  06/15/19 201 lb 9.6 oz (91.4 kg)   Last seen for diabetes 3 months ago.  Management since then includes continue same medication. She reports good compliance with treatment. She is not having side effects.  Symptoms: No fatigue No foot ulcerations  No appetite changes No nausea  No paresthesia of the feet  No polydipsia  No polyuria No visual disturbances   No vomiting     Home blood sugar records: blood sugars are not checked  Episodes of hypoglycemia? No    Current insulin regiment: none Most Recent Eye Exam: not UTD Current exercise: none Current diet habits: in general, an "unhealthy" diet  Pertinent Labs: Lab Results  Component Value Date   CHOL 213 (H) 08/15/2019   HDL 38 (L) 08/15/2019   LDLCALC 126 (H) 08/15/2019   TRIG 276 (H) 08/15/2019   CHOLHDL 5.6 (H) 08/15/2019   Lab Results  Component Value Date   NA 136 (A) 08/13/2019   K 4.5 08/13/2019   CREATININE 1.5 (A) 08/13/2019   GFRNONAA 38 08/13/2019   GFRAA 44 08/13/2019   GLUCOSE 238 (H) 12/19/2018     ---------------------------------------------------------------------------------------------------  Follow up for Bipolar disorder:  The patient was last seen for this 3 months ago. Changes made at last visit  include none; continue same medications.  She reports good compliance with treatment. She feels that condition is Unchanged. She is not having side effects.   -----------------------------------------------------------------------------------------      Medications: Outpatient Medications Prior to Visit  Medication Sig  . allopurinol (ZYLOPRIM) 100 MG tablet TAKE 1 TABLET BY MOUTH EVERY DAY  . clonazePAM (KLONOPIN) 0.5 MG tablet Take 1 tablet (0.5 mg total) by mouth 2 (two) times daily as needed for anxiety.  . dicyclomine (BENTYL) 10 MG capsule Take 1 capsule (10 mg total) by mouth 4 (four) times daily -  before meals and at bedtime.  Marland Kitchen glipiZIDE (GLUCOTROL XL) 5 MG 24 hr tablet Take 1 tablet by mouth daily.  Marland Kitchen lovastatin (MEVACOR) 20 MG tablet TAKE 1 TABLET BY MOUTH EVERYDAY AT BEDTIME  . ondansetron (ZOFRAN) 4 MG tablet Take 1 tablet (4 mg total) by mouth every 8 (eight) hours as needed for nausea or vomiting.  Marland Kitchen QUEtiapine (SEROQUEL) 200 MG tablet Take 1 tablet (200 mg total) by mouth 2 (two) times daily.  . sodium bicarbonate 650 MG tablet Take 2 tablets by mouth in the morning and at bedtime.  Marland Kitchen spironolactone (ALDACTONE) 50 MG tablet TAKE 1 TABLET BY MOUTH TWICE A DAY   No facility-administered medications prior to visit.       Objective    BP 110/80 (BP Location: Right Arm, Patient Position: Sitting, Cuff Size: Large)   Pulse 88   Temp 98.1  F (36.7 C) (Oral)   Resp 16   Wt 203 lb (92.1 kg)   SpO2 97% Comment: room air  BMI 34.84 kg/m    Physical Exam  General appearance: Obese female, cooperative and in no acute distress Head: Normocephalic, without obvious abnormality, atraumatic Respiratory: Respirations even and unlabored, normal respiratory rate Extremities: All extremities are intact.  Skin: Skin color, texture, turgor normal. No rashes seen  Psych: Appropriate mood and affect. Neurologic: Mental status: Alert, oriented to person, place, and time,  thought content appropriate.   Results for orders placed or performed in visit on 11/21/19  POCT HgB A1C  Result Value Ref Range   Hemoglobin A1C 8.3 (A) 4.0 - 5.6 %   Est. average glucose Bld gHb Est-mCnc 192     Assessment & Plan     1. Type 2 diabetes mellitus with stage 3b chronic kidney disease, without long-term current use of insulin (HCC) Improved on glipizide. add- metFORMIN (GLUCOPHAGE) 500 MG tablet; Take 1 tablet (500 mg total) by mouth every evening.  Dispense: 30 tablet; Refill: 3   Given glucometer to monitor home glucose readings.   Future Appointments  Date Time Provider [place]  02/25/2020 11:00 AM Caryn Section, Kirstie Peri, MD [facility]         The entirety of the information documented in the History of Present Illness, Review of Systems and Physical Exam were personally obtained by me. Portions of this information were initially documented by the CMA and reviewed by me for thoroughness and accuracy.      Lelon Huh, MD  [facility] [phone] (phone) [phone] (fax)  [place]

## 2019-11-28 ENCOUNTER — Ambulatory Visit: Payer: Self-pay | Admitting: Family Medicine

## 2020-02-08 ENCOUNTER — Other Ambulatory Visit: Payer: Self-pay | Admitting: Family Medicine

## 2020-02-08 DIAGNOSIS — R030 Elevated blood-pressure reading, without diagnosis of hypertension: Secondary | ICD-10-CM

## 2020-02-17 ENCOUNTER — Other Ambulatory Visit: Payer: Self-pay | Admitting: Family Medicine

## 2020-02-17 DIAGNOSIS — M109 Gout, unspecified: Secondary | ICD-10-CM

## 2020-02-17 NOTE — Telephone Encounter (Signed)
Requested medication (s) are due for refill today: yes  Requested medication (s) are on the active medication list: yes  Last refill:  01/30/19  Future visit scheduled: yes  Notes to clinic:  overdue lab work   Requested Prescriptions  Pending Prescriptions Disp Refills   allopurinol (ZYLOPRIM) 100 MG tablet [Pharmacy Med Name: ALLOPURINOL 100 MG TABLET] 30 tablet 12    Sig: TAKE [place]      Endocrinology:  Gout Agents Failed - 02/17/2020  9:27 AM      Failed - Uric Acid in normal range and within 360 days    No results found for: POCURA, LABURIC        Failed - Cr in normal range and within 360 days    Creatinine  Date Value Ref Range Status  08/13/2019 1.5 (A) 0.5 - 1.1 Final   Creatinine, Ser  Date Value Ref Range Status  12/19/2018 1.90 (H) 0.44 - 1.00 mg/dL Final          Passed - Valid encounter within last 12 months    Recent Outpatient Visits           2 months ago Type 2 diabetes mellitus with stage 3b chronic kidney disease, without long-term current use of insulin ([place])   [facility] Birdie Sons, MD   6 months ago Type 1 diabetes mellitus with stage 3b chronic kidney disease ([place])   [place], Donald E, MD   8 months ago Abdominal pain, unspecified abdominal location   [facility] Birdie Sons, MD   1 year ago [place], [place]   1 year ago Hypertension, unspecified type   [facility] Birdie Sons, MD       Future Appointments             In 1 week Fisher, Kirstie Peri, MD [facility], [place]

## 2020-02-22 NOTE — Progress Notes (Signed)
Established patient visit   Patient: Kim Obrien   DOB: Apr 18, 1965   54 y.o. Female  MRN: [id number] Visit Date: 02/25/2020  Today's healthcare provider: Lelon Huh, MD   Chief Complaint  Patient presents with  . Diabetes   Subjective    HPI  Diabetes Mellitus Type II, Follow-up  Lab Results  Component Value Date   HGBA1C 8.3 (A) 11/21/2019   HGBA1C 9.9 08/13/2019   Wt Readings from Last 3 Encounters:  02/25/20 210 lb (95.3 kg)  11/21/19 203 lb (92.1 kg)  08/15/19 199 lb (90.3 kg)   Last seen for diabetes 3 months ago.  Management since then includes adding metFORMIN (GLUCOPHAGE) 500 MG tablet; Take 1 tablet (500 mg total) by mouth every evening. She reports good compliance with treatment. She is not having side effects.  Symptoms: No fatigue No foot ulcerations  No appetite changes No nausea  No paresthesia of the feet  No polydipsia  No polyuria No visual disturbances   No vomiting     Home blood sugar records: fasting range: 190's  Episodes of hypoglycemia? No    Current insulin regiment: none Most Recent Eye Exam: not UTD Current exercise: none Current diet habits: in general, an "unhealthy" diet  Pertinent Labs: Lab Results  Component Value Date   CHOL 213 (H) 08/15/2019   HDL 38 (L) 08/15/2019   LDLCALC 126 (H) 08/15/2019   TRIG 276 (H) 08/15/2019   CHOLHDL 5.6 (H) 08/15/2019   Lab Results  Component Value Date   NA 136 (A) 08/13/2019   K 4.5 08/13/2019   CREATININE 1.5 (A) 08/13/2019   GFRNONAA 38 08/13/2019   GFRAA 44 08/13/2019   GLUCOSE 238 (H) 12/19/2018     ---------------------------------------------------------------------------------------------------     Medications: Outpatient Medications Prior to Visit  Medication Sig  . allopurinol (ZYLOPRIM) 100 MG tablet TAKE 1 TABLET BY MOUTH EVERY DAY  . clonazePAM (KLONOPIN) 0.5 MG tablet Take 1 tablet (0.5 mg total) by mouth 2 (two) times daily as needed for anxiety.  .  dicyclomine (BENTYL) 10 MG capsule Take 1 capsule (10 mg total) by mouth 4 (four) times daily -  before meals and at bedtime.  Marland Kitchen glipiZIDE (GLUCOTROL XL) 5 MG 24 hr tablet Take 1 tablet by mouth daily.  Marland Kitchen lovastatin (MEVACOR) 20 MG tablet TAKE 1 TABLET BY MOUTH EVERYDAY AT BEDTIME  . metFORMIN (GLUCOPHAGE) 500 MG tablet Take 1 tablet (500 mg total) by mouth every evening.  . ondansetron (ZOFRAN) 4 MG tablet Take 1 tablet (4 mg total) by mouth every 8 (eight) hours as needed for nausea or vomiting.  Marland Kitchen QUEtiapine (SEROQUEL) 200 MG tablet Take 1 tablet (200 mg total) by mouth 2 (two) times daily.  . sodium bicarbonate 650 MG tablet Take 2 tablets by mouth in the morning and at bedtime.  Marland Kitchen spironolactone (ALDACTONE) 50 MG tablet TAKE 1 TABLET BY MOUTH TWICE A DAY   No facility-administered medications prior to visit.    Review of Systems  Constitutional: Negative for appetite change, chills, fatigue and fever.  Respiratory: Negative for chest tightness and shortness of breath.   Cardiovascular: Negative for chest pain and palpitations.  Gastrointestinal: Negative for abdominal pain, nausea and vomiting.  Neurological: Negative for dizziness and weakness.      Objective    BP 109/76 (BP Location: Left Arm, Patient Position: Sitting, Cuff Size: Large)   Pulse (!) 103   Temp 98.2 F (36.8 C) (Oral)   Resp 16  Wt 210 lb (95.3 kg)   BMI 36.05 kg/m    Physical Exam   General: Appearance:    Obese female in no acute distress  Eyes:    PERRL, conjunctiva/corneas clear, EOM's intact       Lungs:     Clear to auscultation bilaterally, respirations unlabored  Heart:    Tachycardic. Normal rhythm. No murmurs, rubs, or gallops.   MS:   All extremities are intact.   Neurologic:   Awake, alert, oriented x 3. No apparent focal neurological           defect.         Results for orders placed or performed in visit on 02/25/20  POCT HgB A1C  Result Value Ref Range   Hemoglobin A1C 9.1 (A)  4.0 - 5.6 %   Est. average glucose Bld gHb Est-mCnc 214     Assessment & Plan     1. Type 2 diabetes mellitus with stage 3b chronic kidney disease, without long-term current use of insulin ([place]) Initially responded well to metformin, but A1c is now increasing again. will add samples of Ozempic 0.25mg  weekly x 4 then 0.5mg  weekly. Call if intolerant, otherwise will contact her in about 6weeks and consider application for Medical assistance.   2. Irritable bowel syndrome without diarrhea Doing well with - dicyclomine (BENTYL) 10 MG capsule; Take 1 capsule (10 mg total) by mouth 4 (four) times daily -  before meals and at bedtime.  Dispense: 60 capsule; Refill: 3  3. Elevated blood-pressure reading without diagnosis of hypertension refill- spironolactone (ALDACTONE) 50 MG tablet; Take 1 tablet (50 mg total) by mouth 2 (two) times daily.  Dispense: 180 tablet; Refill: 2       The entirety of the information documented in the History of Present Illness, Review of Systems and Physical Exam were personally obtained by me. Portions of this information were initially documented by the CMA and reviewed by me for thoroughness and accuracy.      Lelon Huh, MD  [facility] [phone] (phone) [phone] (fax)  [place]

## 2020-02-25 ENCOUNTER — Ambulatory Visit (INDEPENDENT_AMBULATORY_CARE_PROVIDER_SITE_OTHER): Payer: Self-pay | Admitting: Family Medicine

## 2020-02-25 ENCOUNTER — Encounter: Payer: Self-pay | Admitting: Family Medicine

## 2020-02-25 ENCOUNTER — Other Ambulatory Visit: Payer: Self-pay

## 2020-02-25 VITALS — BP 109/76 | HR 103 | Temp 98.2°F | Resp 16 | Wt 210.0 lb

## 2020-02-25 DIAGNOSIS — R030 Elevated blood-pressure reading, without diagnosis of hypertension: Secondary | ICD-10-CM

## 2020-02-25 DIAGNOSIS — E1122 Type 2 diabetes mellitus with diabetic chronic kidney disease: Secondary | ICD-10-CM

## 2020-02-25 DIAGNOSIS — N1832 Chronic kidney disease, stage 3b: Secondary | ICD-10-CM

## 2020-02-25 DIAGNOSIS — K589 Irritable bowel syndrome without diarrhea: Secondary | ICD-10-CM

## 2020-02-25 LAB — POCT GLYCOSYLATED HEMOGLOBIN (HGB A1C)
Est. average glucose Bld gHb Est-mCnc: 214
Hemoglobin A1C: 9.1 % — AB (ref 4.0–5.6)

## 2020-02-25 MED ORDER — SPIRONOLACTONE 50 MG PO TABS: 50.0000 mg | ORAL_TABLET | Freq: Two times a day (BID) | ORAL | 2 refills | Status: DC

## 2020-02-25 MED ORDER — OZEMPIC (0.25 OR 0.5 MG/DOSE) 2 MG/1.5ML ~~LOC~~ SOPN: PEN_INJECTOR | SUBCUTANEOUS | Status: DC

## 2020-02-25 MED ORDER — METFORMIN HCL 500 MG PO TABS: 500.0000 mg | ORAL_TABLET | Freq: Every evening | ORAL | 3 refills | Status: DC

## 2020-02-25 MED ORDER — DICYCLOMINE HCL 10 MG PO CAPS: 10.0000 mg | ORAL_CAPSULE | Freq: Three times a day (TID) | ORAL | 3 refills | Status: DC

## 2020-02-25 NOTE — Patient Instructions (Signed)
.   Please review the attached list of medications and notify my office if there are any errors.   . We will contact you in about 6 weeks to see how the Ozempic is working, and start application for medication assistance if tolerating.

## 2020-03-29 ENCOUNTER — Encounter: Payer: Self-pay | Admitting: Family Medicine

## 2020-03-29 DIAGNOSIS — F3178 Bipolar disorder, in full remission, most recent episode mixed: Secondary | ICD-10-CM

## 2020-03-31 MED ORDER — QUETIAPINE FUMARATE 200 MG PO TABS: 200.0000 mg | ORAL_TABLET | Freq: Two times a day (BID) | ORAL | 5 refills | Status: DC

## 2020-04-15 ENCOUNTER — Encounter: Payer: Self-pay | Admitting: Family Medicine

## 2020-04-21 ENCOUNTER — Telehealth: Payer: Self-pay

## 2020-04-21 NOTE — Chronic Care Management (AMB) (Signed)
    Chronic Care Management Pharmacy Assistant   Name: Kim Obrien  MRN: [id number] DOB: 1965/05/13  Reason for Encounter: Patient Assistance Coordination   PCP : Birdie Sons, MD  04/21/2020- Patient Assistance paperwork filled out for Ozempic with [employer] Patient assistance program. Called patient to inform paperwork will be placed in the mail to her, no answer, left message to fill out all patient information and sign and to bring completed form to PCP office to be signed by Dr Caryn Section and faxed.    Allergies:  No Known Allergies  Medications: Outpatient Encounter Medications as of 04/21/2020  Medication Sig  . allopurinol (ZYLOPRIM) 100 MG tablet TAKE 1 TABLET BY MOUTH EVERY DAY  . clonazePAM (KLONOPIN) 0.5 MG tablet Take 1 tablet (0.5 mg total) by mouth 2 (two) times daily as needed for anxiety.  . dicyclomine (BENTYL) 10 MG capsule Take 1 capsule (10 mg total) by mouth 4 (four) times daily -  before meals and at bedtime.  Marland Kitchen glipiZIDE (GLUCOTROL XL) 5 MG 24 hr tablet Take 1 tablet by mouth daily.  Marland Kitchen lovastatin (MEVACOR) 20 MG tablet TAKE 1 TABLET BY MOUTH EVERYDAY AT BEDTIME  . metFORMIN (GLUCOPHAGE) 500 MG tablet Take 1 tablet (500 mg total) by mouth every evening.  . ondansetron (ZOFRAN) 4 MG tablet Take 1 tablet (4 mg total) by mouth every 8 (eight) hours as needed for nausea or vomiting.  Marland Kitchen QUEtiapine (SEROQUEL) 200 MG tablet Take 1 tablet (200 mg total) by mouth 2 (two) times daily.  . Semaglutide,0.25 or 0.5MG /DOS, (OZEMPIC, 0.25 OR 0.5 MG/DOSE,) 2 MG/1.5ML SOPN Inject 0.25mg  once week x 4, then increase to 0.5mg  once weekly  . sodium bicarbonate 650 MG tablet Take 2 tablets by mouth in the morning and at bedtime.  Marland Kitchen spironolactone (ALDACTONE) 50 MG tablet Take 1 tablet (50 mg total) by mouth 2 (two) times daily.   No facility-administered encounter medications on file as of 04/21/2020.    Current Diagnosis: Patient Active Problem List   Diagnosis Date Noted  .  Type 2 diabetes mellitus with diabetic chronic kidney disease ([place]) 11/21/2019  . Hyperlipidemia associated with type 2 diabetes mellitus ([place]) 08/17/2019  . Hepatic steatosis 06/22/2019  . IBS (irritable bowel syndrome) 06/22/2019  . Hypertension 06/19/2018  . Ovarian mass, left 11/05/2017  . Ovarian torsion 11/05/2017  . Torsion of left ovary and ovarian pedicle 11/05/2017  . Loose bowel movement 07/10/2015  . Arthritis 07/08/2015  . Bipolar affective disorder ([place]) 07/08/2015  . Gouty arthritis 07/08/2015  . Osteoarthritis of knee 07/08/2015  . Chronic abdominal pain 07/08/2015     Follow-Up:  Patient Assistance Coordination  Forms mailed to patient 04/21/20.  Pattricia Boss, [place] Pharmacist Assistant [phone]

## 2020-05-28 ENCOUNTER — Telehealth: Payer: Self-pay

## 2020-05-28 NOTE — Chronic Care Management (AMB) (Addendum)
05/28/2020- Called patient to request financial information for patient assistance application with [employer]. Listed all documents that [employer] would accept. Patient does not have any of the listed documents. She is not working and has applied for medicare disability. She is separated from her husband and has a document from her lawyer that states he has to pay all of her expenses because she is unemployed. Patient will bring this document to the office for a copy to be made and we will attach to application and see they will accept. Patient aware I will look into whether or not another statement of no income is requested or will this be acceptable to [employer]. Junius Argyle, CPP notified.  Pattricia Boss, [place] Pharmacist Assistant [phone]   Addendum 06/03/2020- Patient brought in above letter. Ozempic Patient Assistance form for [employer] completed by patient and faxed for review on 06/03/20  [place] [phone]

## 2020-06-11 NOTE — Telephone Encounter (Signed)
Patient was denied for patient assistance for Ozempic due to lack of financial documentation.   [place], does Dr. Caryn Section have any copay cards for Ozempic? Can we give [place] one to pick up at the front desk?   Thanks, [facility] [phone]

## 2020-06-11 NOTE — Telephone Encounter (Signed)
Patient called in asking for Alex to give her a call back about her application. Can be reached at  Ph# [phone]

## 2020-06-12 NOTE — Telephone Encounter (Signed)
I checked the wall on the back hall and didn't find any copay cards in the office. Please advise on whether you have a copay card.

## 2020-06-26 ENCOUNTER — Encounter: Payer: Self-pay | Admitting: Family Medicine

## 2020-06-26 DIAGNOSIS — E1122 Type 2 diabetes mellitus with diabetic chronic kidney disease: Secondary | ICD-10-CM

## 2020-06-26 DIAGNOSIS — N1832 Chronic kidney disease, stage 3b: Secondary | ICD-10-CM

## 2020-06-30 ENCOUNTER — Other Ambulatory Visit: Payer: Self-pay | Admitting: Family Medicine

## 2020-06-30 DIAGNOSIS — E1122 Type 2 diabetes mellitus with diabetic chronic kidney disease: Secondary | ICD-10-CM

## 2020-06-30 DIAGNOSIS — N1832 Chronic kidney disease, stage 3b: Secondary | ICD-10-CM

## 2020-06-30 MED ORDER — OZEMPIC (0.25 OR 0.5 MG/DOSE) 2 MG/1.5ML ~~LOC~~ SOPN: 0.5000 mg | PEN_INJECTOR | SUBCUTANEOUS | 12 refills | Status: DC

## 2020-07-02 ENCOUNTER — Encounter: Payer: Self-pay | Admitting: Family Medicine

## 2020-07-13 ENCOUNTER — Encounter: Payer: Self-pay | Admitting: Family Medicine

## 2020-07-13 DIAGNOSIS — N1832 Chronic kidney disease, stage 3b: Secondary | ICD-10-CM

## 2020-07-13 DIAGNOSIS — E1122 Type 2 diabetes mellitus with diabetic chronic kidney disease: Secondary | ICD-10-CM

## 2020-07-13 DIAGNOSIS — E1169 Type 2 diabetes mellitus with other specified complication: Secondary | ICD-10-CM

## 2020-07-13 DIAGNOSIS — E785 Hyperlipidemia, unspecified: Secondary | ICD-10-CM

## 2020-07-14 MED ORDER — LOVASTATIN 20 MG PO TABS: ORAL_TABLET | ORAL | 0 refills | Status: DC

## 2020-07-14 MED ORDER — METFORMIN HCL 500 MG PO TABS: 500.0000 mg | ORAL_TABLET | Freq: Every evening | ORAL | 0 refills | Status: DC

## 2020-07-17 ENCOUNTER — Encounter: Payer: Self-pay | Admitting: Family Medicine

## 2020-07-17 DIAGNOSIS — N1832 Chronic kidney disease, stage 3b: Secondary | ICD-10-CM

## 2020-07-18 MED ORDER — SODIUM BICARBONATE 650 MG PO TABS: 1300.0000 mg | ORAL_TABLET | Freq: Two times a day (BID) | ORAL | 11 refills | Status: DC

## 2020-07-18 MED ORDER — GLIPIZIDE ER 5 MG PO TB24: 5.0000 mg | ORAL_TABLET | Freq: Every day | ORAL | 4 refills | Status: DC

## 2020-07-18 NOTE — Telephone Encounter (Signed)
Ok to send in glipizide and sodium bicarbonate? I dont think this was originally prescribed by you. Please review. Thanks!

## 2020-08-04 LAB — MICROALBUMIN, URINE: Microalb, Ur: 60

## 2020-08-04 LAB — MICROALBUMIN / CREATININE URINE RATIO: Microalb Creat Ratio: 18

## 2020-08-12 ENCOUNTER — Other Ambulatory Visit: Payer: Self-pay | Admitting: Family Medicine

## 2020-08-12 DIAGNOSIS — E1122 Type 2 diabetes mellitus with diabetic chronic kidney disease: Secondary | ICD-10-CM

## 2020-08-12 DIAGNOSIS — N1832 Chronic kidney disease, stage 3b: Secondary | ICD-10-CM

## 2020-08-22 ENCOUNTER — Encounter: Payer: Self-pay | Admitting: Family Medicine

## 2020-08-26 ENCOUNTER — Telehealth: Payer: Self-pay

## 2020-08-26 NOTE — Telephone Encounter (Signed)
Do you know anything about the Ozempic shipment?

## 2020-08-26 NOTE — Telephone Encounter (Signed)
Copied from [place] [id number]. Topic: General - Other >> Aug 26, 2020 10:27 AM Keene Breath wrote: Reason for CRM: Patient is calling to ask if the medication Ozempic has been received at the office for her.  She received a letter stating that she was approved and it was being shipped to her PCP office.  Please advise and call patient with an update at [phone] (M)

## 2020-09-01 ENCOUNTER — Other Ambulatory Visit: Payer: Self-pay | Admitting: Family Medicine

## 2020-09-01 DIAGNOSIS — E785 Hyperlipidemia, unspecified: Secondary | ICD-10-CM

## 2020-09-01 DIAGNOSIS — E1169 Type 2 diabetes mellitus with other specified complication: Secondary | ICD-10-CM

## 2020-09-01 NOTE — Telephone Encounter (Signed)
Requested Prescriptions  Pending Prescriptions Disp Refills  . lovastatin (MEVACOR) 20 MG tablet [Pharmacy Med Name: LOVASTATIN 20 MG TABLET] 90 tablet 1    Sig: TAKE 1 TABLET BY MOUTH EVERYDAY AT BEDTIME     Cardiovascular:  Antilipid - Statins Failed - 09/01/2020  5:41 PM      Failed - Total Cholesterol in normal range and within 360 days    Cholesterol, Total  Date Value Ref Range Status  08/15/2019 213 (H) 100 - 199 mg/dL Final         Failed - LDL in normal range and within 360 days    LDL Chol Calc (NIH)  Date Value Ref Range Status  08/15/2019 126 (H) 0 - 99 mg/dL Final         Failed - HDL in normal range and within 360 days    HDL  Date Value Ref Range Status  08/15/2019 38 (L) >39 mg/dL Final         Failed - Triglycerides in normal range and within 360 days    Triglycerides  Date Value Ref Range Status  08/15/2019 276 (H) 0 - 149 mg/dL Final         Passed - Patient is not pregnant      Passed - Valid encounter within last 12 months    Recent Outpatient Visits          6 months ago Type 2 diabetes mellitus with stage 3b chronic kidney disease, without long-term current use of insulin ([place])   [facility] Birdie Sons, MD   9 months ago Type 2 diabetes mellitus with stage 3b chronic kidney disease, without long-term current use of insulin ([place])   [facility] Birdie Sons, MD   1 year ago Type 1 diabetes mellitus with stage 3b chronic kidney disease ([place])   [facility] Birdie Sons, MD   1 year ago Abdominal pain, unspecified abdominal location   [facility] Birdie Sons, MD   1 year ago [place], [place], [place]

## 2020-09-02 ENCOUNTER — Telehealth: Payer: Self-pay

## 2020-09-02 NOTE — Progress Notes (Signed)
    Chronic Care Management Pharmacy Assistant   Name: Kim Obrien  MRN: [id number] DOB: 03-17-1966   Reason for Encounter: Medication Review/Patient Assistance for [facility] visits:  None in previous 6 months  Medications: Outpatient Encounter Medications as of 09/02/2020  Medication Sig  . allopurinol (ZYLOPRIM) 100 MG tablet TAKE 1 TABLET BY MOUTH EVERY DAY  . clonazePAM (KLONOPIN) 0.5 MG tablet Take 1 tablet (0.5 mg total) by mouth 2 (two) times daily as needed for anxiety.  . dicyclomine (BENTYL) 10 MG capsule Take 1 capsule (10 mg total) by mouth 4 (four) times daily -  before meals and at bedtime.  Marland Kitchen glipiZIDE (GLUCOTROL XL) 5 MG 24 hr tablet Take 1 tablet (5 mg total) by mouth daily.  Marland Kitchen lovastatin (MEVACOR) 20 MG tablet TAKE 1 TABLET BY MOUTH EVERYDAY AT BEDTIME  . metFORMIN (GLUCOPHAGE) 500 MG tablet TAKE 1 TABLET BY MOUTH EVERY EVENING.  Marland Kitchen ondansetron (ZOFRAN) 4 MG tablet Take 1 tablet (4 mg total) by mouth every 8 (eight) hours as needed for nausea or vomiting.  Marland Kitchen QUEtiapine (SEROQUEL) 200 MG tablet Take 1 tablet (200 mg total) by mouth 2 (two) times daily.  . Semaglutide,0.25 or 0.5MG /DOS, (OZEMPIC, 0.25 OR 0.5 MG/DOSE,) 2 MG/1.5ML SOPN Inject 0.5 mg as directed once a week.  . sodium bicarbonate 650 MG tablet Take 2 tablets (1,300 mg total) by mouth in the morning and at bedtime.  Marland Kitchen spironolactone (ALDACTONE) 50 MG tablet Take 1 tablet (50 mg total) by mouth 2 (two) times daily.   No facility-administered encounter medications on file as of 09/02/2020.   Star Rating Drugs: Glipizide 5 mg last filled on 08/13/2020 [facility]. Lovastatin 20 mg last filled on 07/24/2020 [facility]. Metformin 500 mg last filled on 08/12/2020 for [facility] day [facility]. Ozempic 0.5 mg  I reached out to [employer] to check the status of Ozempic delivery. Per [employer] , patient PCP office [employer] should  had receive the package today on 09/02/2020  at 10:14 am, and it was signed by Dr. Caryn Section. Patient end date is April 2023, patient needs to reenroll a month prior, and next delivery will be 10-15 days after 10/27/2020.Notifed Clinical Pharmacist.  [place] Pharmacist Assistant [phone]

## 2020-09-07 ENCOUNTER — Other Ambulatory Visit: Payer: Self-pay | Admitting: Family Medicine

## 2020-09-07 DIAGNOSIS — E1122 Type 2 diabetes mellitus with diabetic chronic kidney disease: Secondary | ICD-10-CM

## 2020-09-07 DIAGNOSIS — N1832 Chronic kidney disease, stage 3b: Secondary | ICD-10-CM

## 2020-09-09 ENCOUNTER — Other Ambulatory Visit: Payer: Self-pay | Admitting: Family Medicine

## 2020-09-09 DIAGNOSIS — N1832 Chronic kidney disease, stage 3b: Secondary | ICD-10-CM

## 2020-09-09 DIAGNOSIS — E1122 Type 2 diabetes mellitus with diabetic chronic kidney disease: Secondary | ICD-10-CM

## 2020-09-09 NOTE — Telephone Encounter (Signed)
Requested medications are due for refill today.  yes  Requested medications are on the active medications list.  yes  Last refill. 09/07/2020  Future visit scheduled.   no  Notes to clinic.  Courtesy refill already given.

## 2020-09-10 ENCOUNTER — Encounter: Payer: Self-pay | Admitting: Family Medicine

## 2020-09-10 DIAGNOSIS — N1832 Chronic kidney disease, stage 3b: Secondary | ICD-10-CM

## 2020-09-11 MED ORDER — METFORMIN HCL 500 MG PO TABS: 500.0000 mg | ORAL_TABLET | Freq: Every evening | ORAL | 0 refills | Status: DC

## 2020-09-15 ENCOUNTER — Other Ambulatory Visit: Payer: Self-pay

## 2020-09-15 ENCOUNTER — Ambulatory Visit (INDEPENDENT_AMBULATORY_CARE_PROVIDER_SITE_OTHER): Payer: Self-pay | Admitting: Family Medicine

## 2020-09-15 ENCOUNTER — Encounter: Payer: Self-pay | Admitting: Family Medicine

## 2020-09-15 VITALS — BP 99/87 | HR 115 | Wt 212.0 lb

## 2020-09-15 DIAGNOSIS — R030 Elevated blood-pressure reading, without diagnosis of hypertension: Secondary | ICD-10-CM

## 2020-09-15 DIAGNOSIS — E1122 Type 2 diabetes mellitus with diabetic chronic kidney disease: Secondary | ICD-10-CM

## 2020-09-15 DIAGNOSIS — N1832 Chronic kidney disease, stage 3b: Secondary | ICD-10-CM

## 2020-09-15 DIAGNOSIS — E785 Hyperlipidemia, unspecified: Secondary | ICD-10-CM

## 2020-09-15 DIAGNOSIS — E1169 Type 2 diabetes mellitus with other specified complication: Secondary | ICD-10-CM

## 2020-09-15 LAB — POCT GLYCOSYLATED HEMOGLOBIN (HGB A1C): Hemoglobin A1C: 7.4 % — AB (ref 4.0–5.6)

## 2020-09-15 MED ORDER — SPIRONOLACTONE 50 MG PO TABS: 50.0000 mg | ORAL_TABLET | Freq: Every day | ORAL | 1 refills | Status: DC

## 2020-09-15 MED ORDER — LOVASTATIN 20 MG PO TABS: 20.0000 mg | ORAL_TABLET | Freq: Every day | ORAL | 4 refills | Status: DC

## 2020-09-15 MED ORDER — GLIPIZIDE ER 2.5 MG PO TB24: 2.5000 mg | ORAL_TABLET | Freq: Every day | ORAL | 1 refills | Status: DC

## 2020-09-15 MED ORDER — METFORMIN HCL 500 MG PO TABS: 500.0000 mg | ORAL_TABLET | Freq: Every evening | ORAL | 1 refills | Status: DC

## 2020-09-15 NOTE — Progress Notes (Signed)
Established patient visit   Patient: Kim Obrien   DOB: 1966/02/17   55 y.o. Female  MRN: [id number] Visit Date: 09/15/2020  Today's healthcare provider: Lelon Huh, MD   Chief Complaint  Patient presents with  . Diabetes   Subjective    HPI  Diabetes Mellitus Type II, Follow-up  Lab Results  Component Value Date   HGBA1C 9.1 (A) 02/25/2020   HGBA1C 8.3 (A) 11/21/2019   HGBA1C 9.9 08/13/2019   Wt Readings from Last 3 Encounters:  09/15/20 212 lb (96.2 kg)  02/25/20 210 lb (95.3 kg)  11/21/19 203 lb (92.1 kg)   Last seen for diabetes 6 months ago.  Management since then includes add samples of Ozempic 0.25mg  weekly x 4 then 0.5mg  weekly. She reports fair compliance with treatment.  Pt reports she ran out in January while waiting for patient assistance to get approved, but has started back this last week. She is not having side effects.  Symptoms: No fatigue No foot ulcerations  No appetite changes No nausea  No paresthesia of the feet  No polydipsia  No polyuria No visual disturbances   No vomiting     Home blood sugar records: fasting range: mid 100's  Episodes of hypoglycemia? No    Current insulin regiment: none Most Recent Eye Exam: Pt states she has recently been to [facility] last exam.  Current exercise: none Current diet habits: in general, a "healthy" diet    Pertinent Labs: Lab Results  Component Value Date   CHOL 213 (H) 08/15/2019   HDL 38 (L) 08/15/2019   LDLCALC 126 (H) 08/15/2019   TRIG 276 (H) 08/15/2019   CHOLHDL 5.6 (H) 08/15/2019   Lab Results  Component Value Date   NA 136 (A) 08/13/2019   K 4.5 08/13/2019   CREATININE 1.5 (A) 08/13/2019   GFRNONAA 38 08/13/2019   GFRAA 44 08/13/2019   GLUCOSE 238 (H) 12/19/2018     ---------------------------------------------------------------------------------------------------  No Known Allergies   Medications: Outpatient Medications Prior to Visit  Medication Sig  .  allopurinol (ZYLOPRIM) 100 MG tablet TAKE 1 TABLET BY MOUTH EVERY DAY  . clonazePAM (KLONOPIN) 0.5 MG tablet Take 1 tablet (0.5 mg total) by mouth 2 (two) times daily as needed for anxiety.  . dicyclomine (BENTYL) 10 MG capsule Take 1 capsule (10 mg total) by mouth 4 (four) times daily -  before meals and at bedtime.  Marland Kitchen glipiZIDE (GLUCOTROL XL) 5 MG 24 hr tablet Take 1 tablet (5 mg total) by mouth daily.  Marland Kitchen lovastatin (MEVACOR) 20 MG tablet TAKE 1 TABLET BY MOUTH EVERYDAY AT BEDTIME  . metFORMIN (GLUCOPHAGE) 500 MG tablet Take 1 tablet (500 mg total) by mouth every evening.  . ondansetron (ZOFRAN) 4 MG tablet Take 1 tablet (4 mg total) by mouth every 8 (eight) hours as needed for nausea or vomiting.  Marland Kitchen QUEtiapine (SEROQUEL) 200 MG tablet Take 1 tablet (200 mg total) by mouth 2 (two) times daily.  . Semaglutide,0.25 or 0.5MG /DOS, (OZEMPIC, 0.25 OR 0.5 MG/DOSE,) 2 MG/1.5ML SOPN Inject 0.5 mg as directed once a week.  . sodium bicarbonate 650 MG tablet Take 2 tablets (1,300 mg total) by mouth in the morning and at bedtime.  Marland Kitchen spironolactone (ALDACTONE) 50 MG tablet Take 1 tablet (50 mg total) by mouth 2 (two) times daily.   No facility-administered medications prior to visit.    Review of Systems  Gastrointestinal: Negative for diarrhea.  Objective    BP 99/87 (BP Location: Right Arm, Patient Position: Sitting, Cuff Size: Large)   Pulse (!) 115   Wt 212 lb (96.2 kg)   SpO2 99%   BMI 36.39 kg/m     Physical Exam   General appearance: Mildly obese female, cooperative and in no acute distress Head: Normocephalic, without obvious abnormality, atraumatic Respiratory: Respirations even and unlabored, normal respiratory rate Extremities: All extremities are intact.  Skin: Skin color, texture, turgor normal. No rashes seen  Psych: Appropriate mood and affect. Neurologic: Mental status: Alert, oriented to person, place, and time, thought content appropriate.   Results for orders  placed or performed in visit on 09/15/20  POCT glycosylated hemoglobin (Hb A1C)  Result Value Ref Range   Hemoglobin A1C 7.4 (A) 4.0 - 5.6 %    Assessment & Plan     1. Type 2 diabetes mellitus with stage 3b chronic kidney disease, without long-term current use of insulin ([place]) Fairly well controlled, doing well with Ozempic which she just started back on. Is to titrate up to 0.5mg  after four weekly doses.  Will reduce- glipiZIDE (GLUCOTROL XL) to 2.5 MG 24 hr tablet; Take 1 tablet (2.5 mg total) by mouth daily.  Dispense: 90 tablet; Refill: 1  Continue  metFORMIN (GLUCOPHAGE) 500 MG tablet; Take 1 tablet (500 mg total) by mouth every evening.  Dispense: 90 tablet; Refill: 1  2. Elevated blood-pressure reading without diagnosis of hypertension Very well controlled, will reduce- spironolactone (ALDACTONE) 50 MG tablet; from 2 tablets to  1 tablet (50 mg total) by mouth daily.  Dispense: 90 tablet; Refill: 1  3. Hyperlipidemia associated with type 2 diabetes mellitus ([place]) Very well controlled, Continue current medications.  refill- lovastatin (MEVACOR) 20 MG tablet; Take 1 tablet (20 mg total) by mouth daily.  Dispense: 90 tablet; Refill: 4   Follow up 3-4 months.       The entirety of the information documented in the History of Present Illness, Review of Systems and Physical Exam were personally obtained by me. Portions of this information were initially documented by the CMA and reviewed by me for thoroughness and accuracy.      Lelon Huh, MD  [facility] [phone] (phone) [phone] (fax)  [place]

## 2020-09-15 NOTE — Patient Instructions (Addendum)
.   Take Ozempic 0.25mg  weekly for four weeks (4 doses), then increase to 0.5mg  weekly

## 2020-09-17 ENCOUNTER — Telehealth: Payer: Self-pay | Admitting: Family Medicine

## 2020-09-17 NOTE — Telephone Encounter (Signed)
Please disregard, wrong patient.

## 2020-09-17 NOTE — Telephone Encounter (Addendum)
Please disregard, wrong patient.

## 2020-09-29 ENCOUNTER — Other Ambulatory Visit: Payer: Self-pay | Admitting: Family Medicine

## 2020-09-29 ENCOUNTER — Encounter: Payer: Self-pay | Admitting: Family Medicine

## 2020-09-29 DIAGNOSIS — F3178 Bipolar disorder, in full remission, most recent episode mixed: Secondary | ICD-10-CM

## 2020-09-29 MED ORDER — QUETIAPINE FUMARATE 200 MG PO TABS: 200.0000 mg | ORAL_TABLET | Freq: Two times a day (BID) | ORAL | 5 refills | Status: DC

## 2020-09-29 NOTE — Telephone Encounter (Signed)
Requested medication (s) are due for refill today: yes   Requested medication (s) are on the active medication list: yes   Last refill: 09/01/2020  Future visit scheduled: yes   Notes to clinic: This refill cannot be delegated   Requested Prescriptions  Pending Prescriptions Disp Refills   QUEtiapine (SEROQUEL) 200 MG tablet [Pharmacy Med Name: QUEtiapine FUMARATE 200 MG TAB] 60 tablet 5    Sig: TAKE ONE TABLET BY MOUTH TWICE A DAY      Not Delegated - Psychiatry:  Antipsychotics - Second Generation ([place]) - quetiapine Failed - 09/29/2020 12:06 PM      Failed - This refill cannot be delegated      Failed - ALT in normal range and within 180 days    ALT  Date Value Ref Range Status  05/03/2018 20 0 - 32 IU/L Final          Failed - AST in normal range and within 180 days    AST  Date Value Ref Range Status  05/03/2018 26 0 - 40 IU/L Final          Passed - Last BP in normal range    BP Readings from Last 1 Encounters:  09/15/20 99/87          Passed - Valid encounter within last 6 months    Recent Outpatient Visits           2 weeks ago Type 2 diabetes mellitus with stage 3b chronic kidney disease, without long-term current use of insulin ([place])   [facility] Birdie Sons, MD   7 months ago Type 2 diabetes mellitus with stage 3b chronic kidney disease, without long-term current use of insulin ([place])   [facility] Birdie Sons, MD   10 months ago Type 2 diabetes mellitus with stage 3b chronic kidney disease, without long-term current use of insulin ([place])   [facility] Birdie Sons, MD   1 year ago Type 1 diabetes mellitus with stage 3b chronic kidney disease ([place])   [place], Donald E, MD   1 year ago Abdominal pain, unspecified abdominal location   [facility] Birdie Sons, MD       Future Appointments             In 3 months Fisher, Kirstie Peri, MD [facility], [place]

## 2020-12-29 ENCOUNTER — Ambulatory Visit (INDEPENDENT_AMBULATORY_CARE_PROVIDER_SITE_OTHER): Payer: Medicaid Other | Admitting: Family Medicine

## 2020-12-29 ENCOUNTER — Encounter: Payer: Self-pay | Admitting: Family Medicine

## 2020-12-29 ENCOUNTER — Other Ambulatory Visit: Payer: Self-pay

## 2020-12-29 VITALS — BP 123/80 | HR 103 | Temp 97.3°F | Resp 18 | Wt 218.0 lb

## 2020-12-29 DIAGNOSIS — E1122 Type 2 diabetes mellitus with diabetic chronic kidney disease: Secondary | ICD-10-CM | POA: Diagnosis not present

## 2020-12-29 DIAGNOSIS — E1169 Type 2 diabetes mellitus with other specified complication: Secondary | ICD-10-CM | POA: Diagnosis not present

## 2020-12-29 DIAGNOSIS — Z1239 Encounter for other screening for malignant neoplasm of breast: Secondary | ICD-10-CM

## 2020-12-29 DIAGNOSIS — Z1211 Encounter for screening for malignant neoplasm of colon: Secondary | ICD-10-CM | POA: Diagnosis not present

## 2020-12-29 DIAGNOSIS — R110 Nausea: Secondary | ICD-10-CM

## 2020-12-29 DIAGNOSIS — N1832 Chronic kidney disease, stage 3b: Secondary | ICD-10-CM | POA: Diagnosis not present

## 2020-12-29 DIAGNOSIS — Z124 Encounter for screening for malignant neoplasm of cervix: Secondary | ICD-10-CM

## 2020-12-29 DIAGNOSIS — E785 Hyperlipidemia, unspecified: Secondary | ICD-10-CM

## 2020-12-29 DIAGNOSIS — Z1159 Encounter for screening for other viral diseases: Secondary | ICD-10-CM | POA: Diagnosis not present

## 2020-12-29 LAB — POCT GLYCOSYLATED HEMOGLOBIN (HGB A1C)
Est. average glucose Bld gHb Est-mCnc: 151
Hemoglobin A1C: 6.9 % — AB (ref 4.0–5.6)

## 2020-12-29 IMAGING — US US RENAL
1 series · 14 of 25 positions shown · non-contrast
Comparison: CT 11/04/2017

CLINICAL DATA: Chronic kidney disease

EXAM:
RENAL / URINARY TRACT ULTRASOUND COMPLETE

[Series 1: us renal · 0.25mm/px · 14 of 38 slices shown]
[im 1/38]
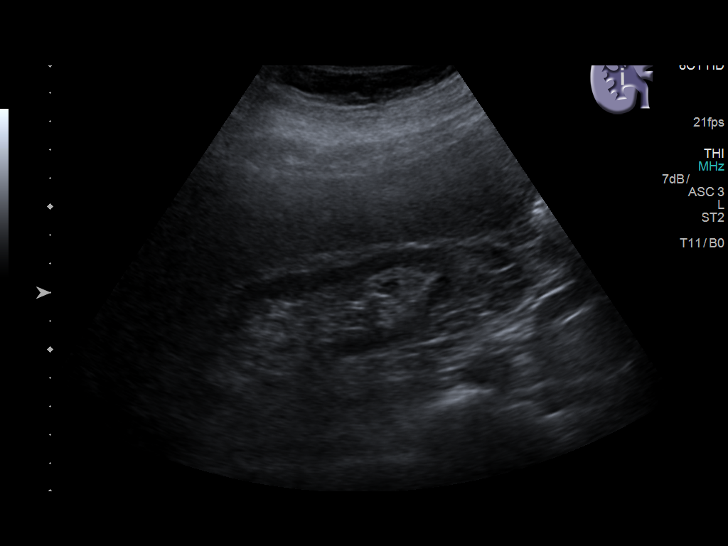
[im 4/38]
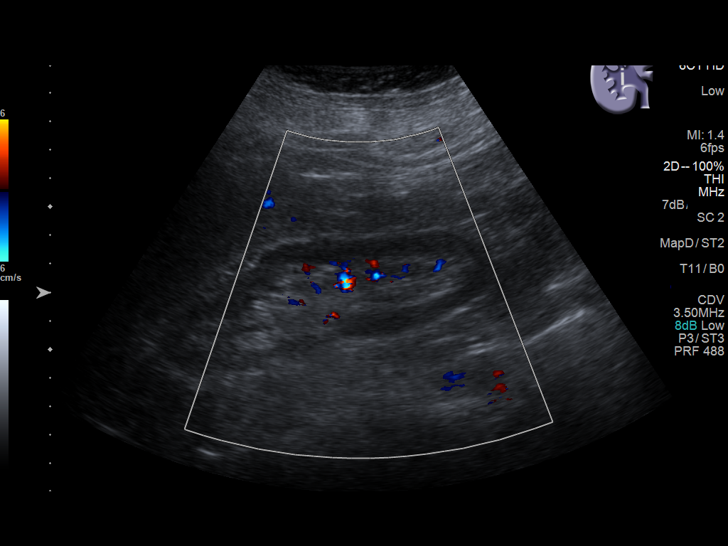
[im 7/38]
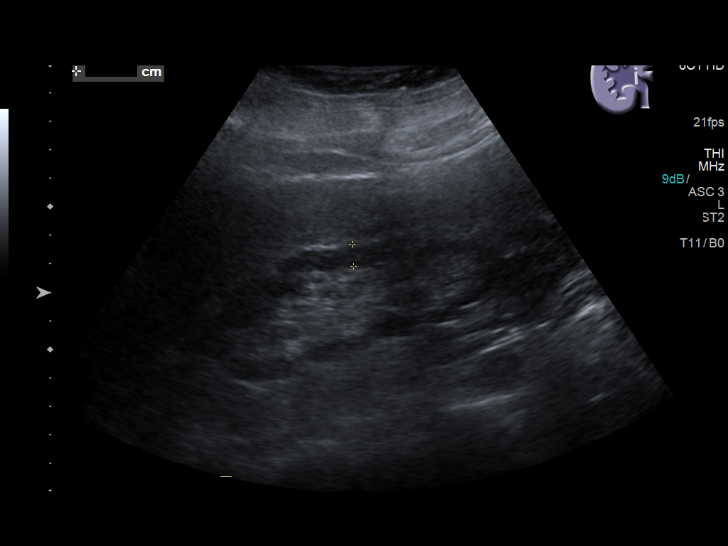
[im 10/38]
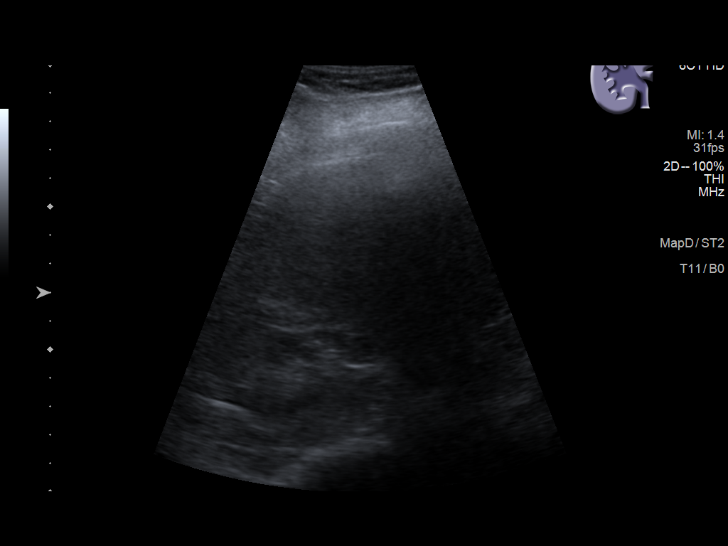
[im 13/38]
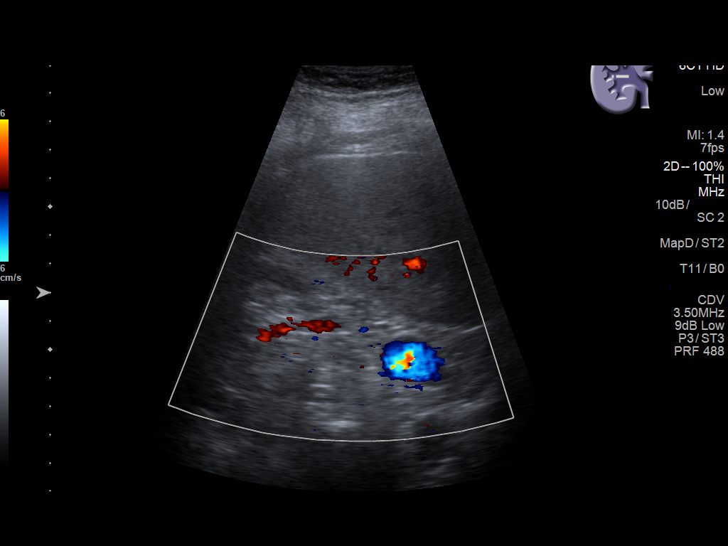
[im 14/38]
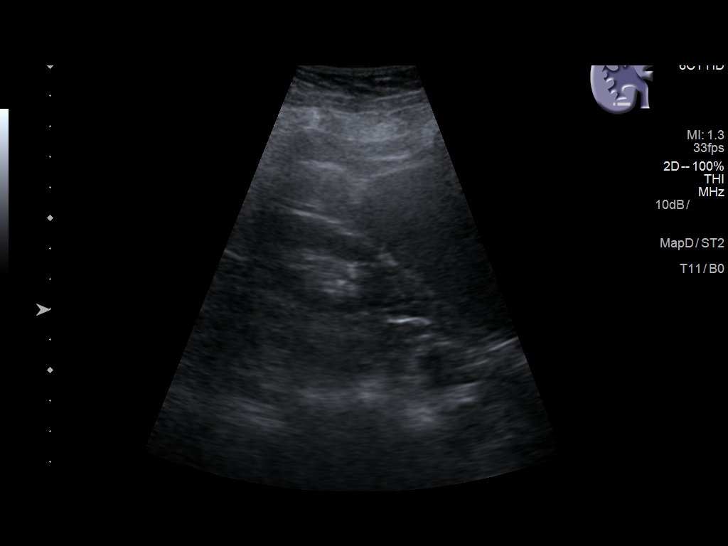
[im 17/38]
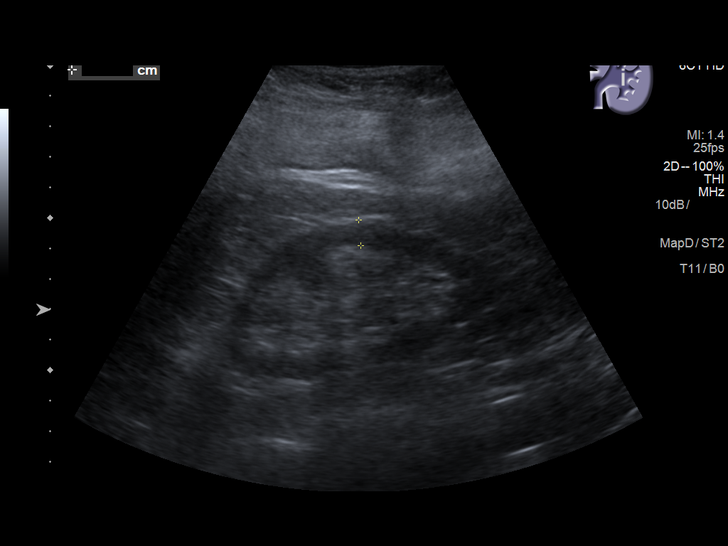
[im 21/38]
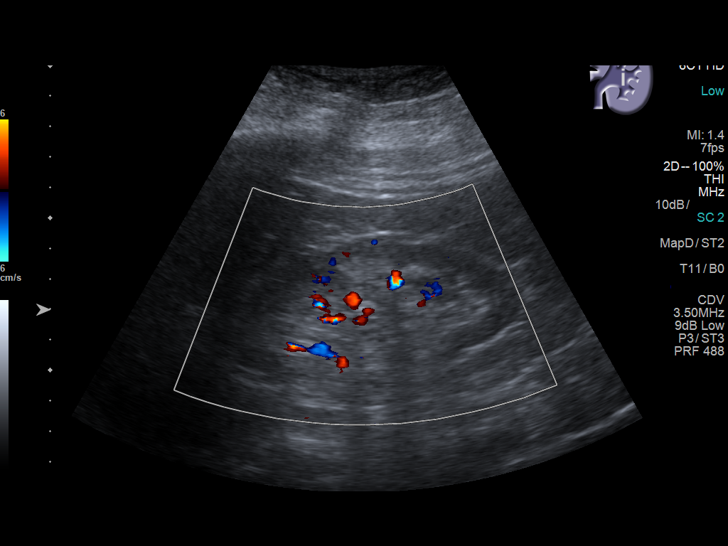
[im 24/38]
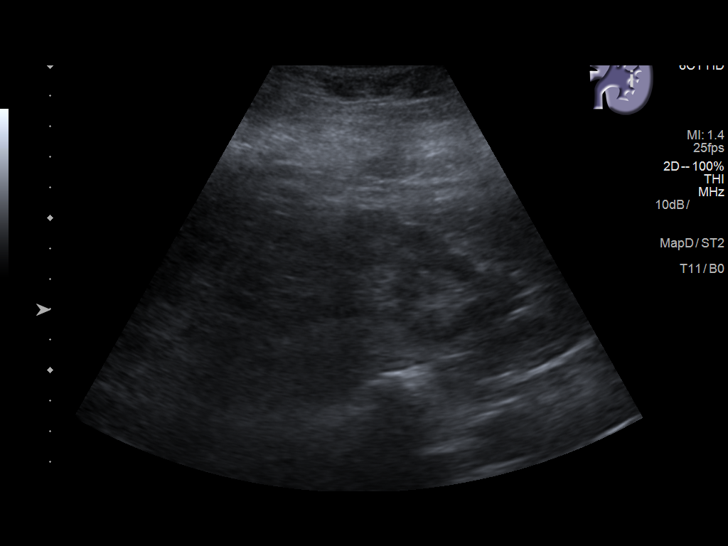
[im 25/38]
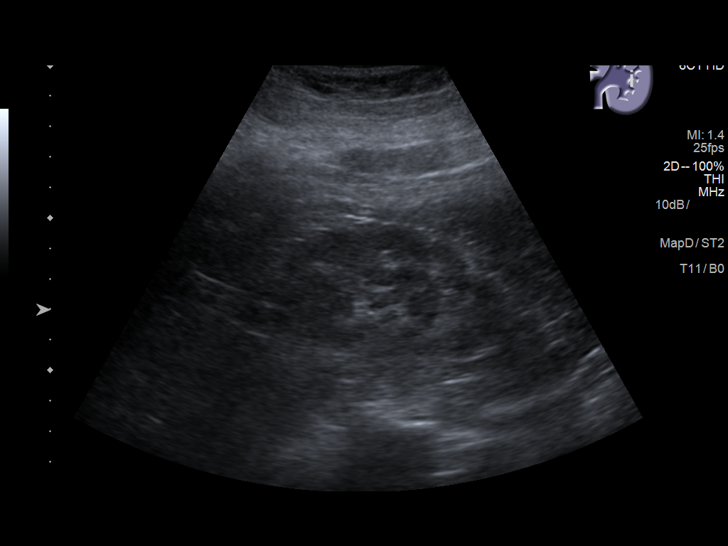
[im 28/38]
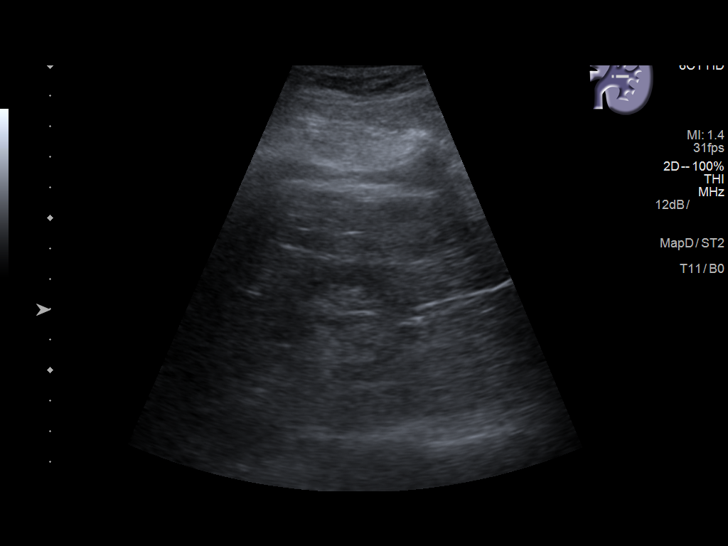
[im 31/38]
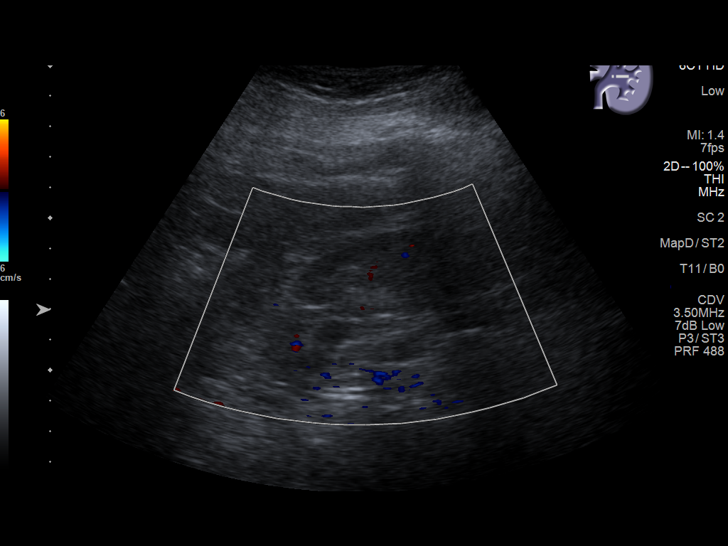
[im 34/38]
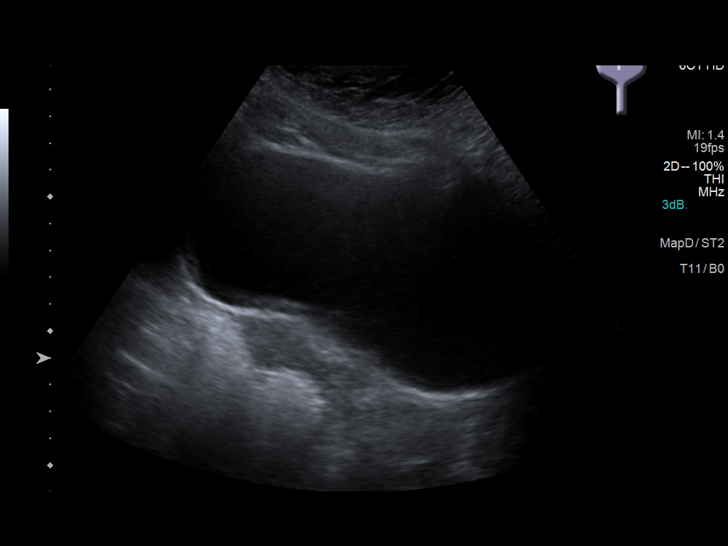
[im 38/38]
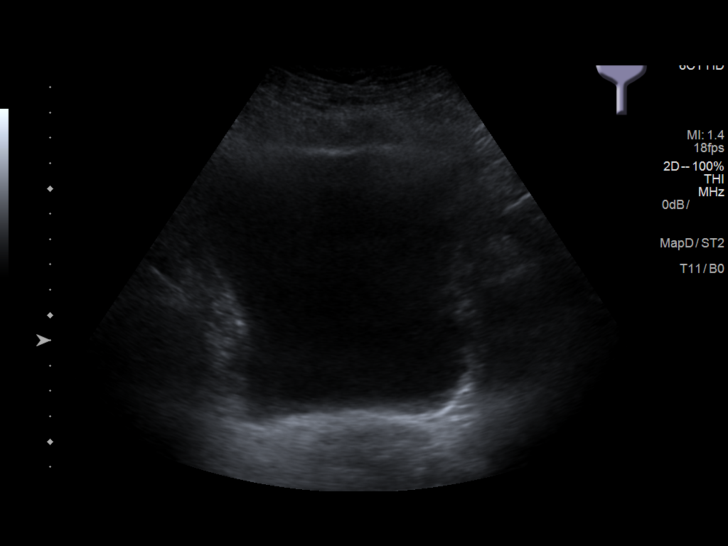

[14 of 25 positions shown; findings below may reference images not displayed]

FINDINGS: Right Kidney:

Renal measurements: 11.6 by 3.8 x 3.9 cm = volume: 90.7 mL.
Increased cortical echogenicity with mild cortical thinning. No
hydronephrosis.

Left Kidney:

Renal measurements: 10.5 x 4.3 x 4.7 cm = volume: 112 mL. Slight
increased cortical echogenicity with cortical thinning present. No
hydronephrosis. Mildly prominent left extrarenal pelvis.

Bladder:

Appears normal for degree of bladder distention. Bladder is
distended
IMPRESSION: Slightly echogenic kidneys consistent with medical renal disease.
Mild cortical thinning consistent with slight atrophy. No
hydronephrosis

## 2020-12-29 MED ORDER — OZEMPIC (0.25 OR 0.5 MG/DOSE) 2 MG/1.5ML ~~LOC~~ SOPN: 0.2500 mg | PEN_INJECTOR | SUBCUTANEOUS | Status: DC

## 2020-12-29 MED ORDER — ONDANSETRON HCL 4 MG PO TABS: 4.0000 mg | ORAL_TABLET | Freq: Three times a day (TID) | ORAL | 3 refills | Status: DC | PRN

## 2020-12-29 NOTE — Patient Instructions (Signed)
Please review the attached list of medications and notify my office if there are any errors.   Reduce Ozempic back down to 0.'25mg'$  to see that helps with the nausea

## 2020-12-29 NOTE — Progress Notes (Signed)
Established patient visit   Patient: Kim Obrien   DOB: [birth year]   55 y.o. Female  MRN: [id number] Visit Date: 12/29/2020  Today's healthcare provider: Lelon Huh, MD   Chief Complaint  Patient presents with   Diabetes   Hyperlipidemia   Hypertension   Subjective    HPI  Diabetes Mellitus Type II, Follow-up  Lab Results  Component Value Date   HGBA1C 7.4 (A) 09/15/2020   HGBA1C 9.1 (A) 02/25/2020   HGBA1C 8.3 (A) 11/21/2019   Wt Readings from Last 3 Encounters:  12/29/20 218 lb (98.9 kg)  09/15/20 212 lb (96.2 kg)  02/25/20 210 lb (95.3 kg)   Last seen for diabetes 3 months ago.  Management since then includes titrating Ozempic  up to 0.5mg  after four weekly doses.  Reduced glipiZIDE (GLUCOTROL XL) to 2.5 MG daily. Patient was advised to continue Metformin 500mg  daily. She reports good compliance with treatment. She does states she has been much more nauseated the last month or so and had a couple episodes severe enough to make her vomit. The ondansetron has worked well.  Symptoms: No fatigue No foot ulcerations  No appetite changes Yes nausea  No paresthesia of the feet  No polydipsia  No polyuria No visual disturbances   No vomiting     Home blood sugar records: fasting range: 120's  Episodes of hypoglycemia? No    Current insulin regiment: none Most Recent Eye Exam: not UTD Current exercise: none Current diet habits: well balanced   ---------------------------------------------------------------------------------------------------   Lipid/Cholesterol, Follow-up  Last lipid panel Other pertinent labs  Lab Results  Component Value Date   CHOL 213 (H) 08/15/2019   HDL 38 (L) 08/15/2019   LDLCALC 126 (H) 08/15/2019   TRIG 276 (H) 08/15/2019   CHOLHDL 5.6 (H) 08/15/2019   Lab Results  Component Value Date   ALT 20 05/03/2018   AST 26 05/03/2018   PLT 218 08/13/2019   TSH 0.300 (L) 12/19/2018     She was last seen for this 3 months  ago.  Management since that visit includes continuing same medication.  She reports good compliance with treatment. She is not having side effects.   Symptoms: No chest pain No chest pressure/discomfort  No dyspnea No lower extremity edema  No numbness or tingling of extremity No orthopnea  No palpitations No paroxysmal nocturnal dyspnea  No speech difficulty No syncope   Current diet: well balanced Current exercise: none  The 10-year ASCVD risk score (Arnett DK, et al., 2019) is: 6.6%  ---------------------------------------------------------------------------------------------------   Hypertension, follow-up  BP Readings from Last 3 Encounters:  12/29/20 123/80  09/15/20 99/87  02/25/20 109/76   Wt Readings from Last 3 Encounters:  12/29/20 218 lb (98.9 kg)  09/15/20 212 lb (96.2 kg)  02/25/20 210 lb (95.3 kg)     She was last seen for hypertension 3 months ago.  BP at that visit was 99/87. Management since that visit includes reducing spironolactone (ALDACTONE) 50 MG tablet; from 2 tablets to  1 tablet (50 mg total) by mouth daily.  She reports good compliance with treatment. She is not having side effects.  She is following a Regular diet. She is not exercising. She does not smoke.  Use of agents associated with hypertension: none.   Outside blood pressures are not checked regularly. Symptoms: No chest pain No chest pressure  No palpitations No syncope  No dyspnea No orthopnea  No paroxysmal nocturnal dyspnea No lower extremity  edema   Pertinent labs: Lab Results  Component Value Date   NA 136 (A) 08/13/2019   K 4.5 08/13/2019   CREATININE 1.5 (A) 08/13/2019   GFRNONAA 38 08/13/2019   GFRAA 44 08/13/2019   GLUCOSE 238 (H) 12/19/2018     The 10-year ASCVD risk score (Arnett DK, et al., 2019) is: 6.6%   ---------------------------------------------------------------------------------------------------     Medications: Outpatient Medications Prior  to Visit  Medication Sig   allopurinol (ZYLOPRIM) 100 MG tablet TAKE 1 TABLET BY MOUTH EVERY DAY   clonazePAM (KLONOPIN) 0.5 MG tablet Take 1 tablet (0.5 mg total) by mouth 2 (two) times daily as needed for anxiety.   dicyclomine (BENTYL) 10 MG capsule Take 1 capsule (10 mg total) by mouth 4 (four) times daily -  before meals and at bedtime.   glipiZIDE (GLUCOTROL XL) 2.5 MG 24 hr tablet Take 1 tablet (2.5 mg total) by mouth daily.   lovastatin (MEVACOR) 20 MG tablet Take 1 tablet (20 mg total) by mouth daily.   metFORMIN (GLUCOPHAGE) 500 MG tablet Take 1 tablet (500 mg total) by mouth every evening.   ondansetron (ZOFRAN) 4 MG tablet Take 1 tablet (4 mg total) by mouth every 8 (eight) hours as needed for nausea or vomiting.   QUEtiapine (SEROQUEL) 200 MG tablet Take 1 tablet (200 mg total) by mouth 2 (two) times daily.   Semaglutide,0.25 or 0.5MG /DOS, (OZEMPIC, 0.25 OR 0.5 MG/DOSE,) 2 MG/1.5ML SOPN Inject 0.5 mg as directed once a week.   sodium bicarbonate 650 MG tablet Take 2 tablets (1,300 mg total) by mouth in the morning and at bedtime.   spironolactone (ALDACTONE) 50 MG tablet Take 1 tablet (50 mg total) by mouth daily.   No facility-administered medications prior to visit.    Review of Systems  Constitutional:  Negative for appetite change, chills, fatigue and fever.  Respiratory:  Negative for chest tightness and shortness of breath.   Cardiovascular:  Negative for chest pain and palpitations.  Gastrointestinal:  Positive for nausea (after eating and in the mornings) and vomiting. Negative for abdominal pain.  Neurological:  Negative for dizziness and weakness.      Objective    BP 123/80 (BP Location: Left Arm, Patient Position: Sitting, Cuff Size: Large)   Pulse (!) 103   Temp (!) 97.3 F (36.3 C) (Temporal)   Resp 18   Wt 218 lb (98.9 kg)   BMI 37.42 kg/m  {Show previous vital signs (optional):23777}  Physical Exam  General appearance: Mildly obese female,  cooperative and in no acute distress Head: Normocephalic, without obvious abnormality, atraumatic Respiratory: Respirations even and unlabored, normal respiratory rate Extremities: All extremities are intact.  Skin: Skin color, texture, turgor normal. No rashes seen  Psych: Appropriate mood and affect. Neurologic: Mental status: Alert, oriented to person, place, and time, thought content appropriate.   Results for orders placed or performed in visit on 12/29/20  POCT HgB A1C  Result Value Ref Range   Hemoglobin A1C 6.9 (A) 4.0 - 5.6 %   Est. average glucose Bld gHb Est-mCnc 151     Assessment & Plan     1. Type 2 diabetes mellitus with stage 3b chronic kidney disease, without long-term current use of insulin (HCC) A1c well controlled, but having persistent nausea since increase to 0.5mg  Semaglutide. Will go back down to 0.25mg  once a day .  2. Colon cancer screening  - Cologuard  3. Need for hepatitis C screening test  - Hepatitis C antibody  4. Hyperlipidemia associated with type 2 diabetes mellitus ([place]) She is tolerating lovastatin well with no adverse effects.   - CBC - Lipid panel  5. Cervical cancer screening  - Ambulatory referral to Gynecology  6. Encounter for screening for malignant neoplasm of breast, unspecified screening modality  - Ambulatory referral to Gynecology  7. Nausea  - Hepatic function panel Likely aggravated by increased dose of semaglutide. Will go back to 0.25 to see how much difference that makes.  Refill ondansetron (ZOFRAN) 4 MG tablet; Take 1 tablet (4 mg total) by mouth every 8 (eight) hours as needed for nausea or vomiting.  Dispense: 20 tablet; Refill: 3   Future Appointments  Date Time Provider [place]  03/31/2021 11:00 AM Caryn Section, Kirstie Peri, MD [facility]         The entirety of the information documented in the History of Present Illness, Review of Systems and Physical Exam were personally obtained by me. Portions of  this information were initially documented by the CMA and reviewed by me for thoroughness and accuracy.     Lelon Huh, MD  [facility] [phone] (phone) [phone] (fax)  [place]

## 2020-12-30 LAB — LIPID PANEL
Chol/HDL Ratio: 4 ratio (ref 0.0–4.4)
Cholesterol, Total: 166 mg/dL (ref 100–199)
HDL: 42 mg/dL (ref >39)
LDL Chol Calc (NIH): 81 mg/dL (ref 0–99)
Triglycerides: 260 mg/dL — ABNORMAL HIGH (ref 0–149)
VLDL Cholesterol Cal: 43 mg/dL — ABNORMAL HIGH (ref 5–40)

## 2020-12-30 LAB — HEPATIC FUNCTION PANEL
ALT: 29 IU/L (ref 0–32)
AST: 25 IU/L (ref 0–40)
Albumin: 4.2 g/dL (ref 3.8–4.9)
Alkaline Phosphatase: 73 IU/L (ref 44–121)
Bilirubin Total: 0.3 mg/dL (ref 0.0–1.2)
Bilirubin, Direct: 0.1 mg/dL (ref 0.00–0.40)
Total Protein: 7 g/dL (ref 6.0–8.5)

## 2020-12-30 LAB — CBC
Hematocrit: 42.3 % (ref 34.0–46.6)
Hemoglobin: 14.1 g/dL (ref 11.1–15.9)
MCH: 30.7 pg (ref 26.6–33.0)
MCHC: 33.3 g/dL (ref 31.5–35.7)
MCV: 92 fL (ref 79–97)
Platelets: 215 10*3/uL (ref 150–450)
RBC: 4.59 x10E6/uL (ref 3.77–5.28)
RDW: 12.8 % (ref 11.7–15.4)
WBC: 6 10*3/uL (ref 3.4–10.8)

## 2020-12-30 LAB — HEPATITIS C ANTIBODY: Hep C Virus Ab: <0.1 s/co ratio (ref 0.0–0.9)

## 2020-12-30 NOTE — Telephone Encounter (Signed)
New order for external GYN referral placed. Please schedule appointment.

## 2021-01-15 ENCOUNTER — Other Ambulatory Visit: Payer: Self-pay | Admitting: Family Medicine

## 2021-01-15 DIAGNOSIS — K589 Irritable bowel syndrome without diarrhea: Secondary | ICD-10-CM

## 2021-01-15 NOTE — Telephone Encounter (Signed)
Requested Prescriptions  Pending Prescriptions Disp Refills  . dicyclomine (BENTYL) 10 MG capsule [Pharmacy Med Name: DICYCLOMINE 10 MG CAPSULE] 60 capsule 3    Sig: TAKE 1 CAPSULE (10 MG TOTAL) BY MOUTH 4 (FOUR) TIMES DAILY - BEFORE MEALS AND AT BEDTIME.     Gastroenterology:  Antispasmodic Agents Passed - 01/15/2021 11:06 AM      Passed - Last Heart Rate in normal range    Pulse Readings from Last 1 Encounters:  12/29/20 (!) 103         Passed - Valid encounter within last 12 months    Recent Outpatient Visits          2 weeks ago Type 2 diabetes mellitus with stage 3b chronic kidney disease, without long-term current use of insulin ([place])   [facility] Birdie Sons, MD   4 months ago Type 2 diabetes mellitus with stage 3b chronic kidney disease, without long-term current use of insulin ([place])   [facility] Birdie Sons, MD   10 months ago Type 2 diabetes mellitus with stage 3b chronic kidney disease, without long-term current use of insulin ([place])   [facility] Birdie Sons, MD   1 year ago Type 2 diabetes mellitus with stage 3b chronic kidney disease, without long-term current use of insulin ([place])   [facility] Birdie Sons, MD   1 year ago Type 1 diabetes mellitus with stage 3b chronic kidney disease ([place])   [place], Kirstie Peri, MD      Future Appointments            In 2 months Fisher, Kirstie Peri, MD [facility], [place]

## 2021-01-20 LAB — COLOGUARD: Cologuard: NEGATIVE

## 2021-02-04 LAB — RESULTS CONSOLE HPV: CHL HPV: NEGATIVE

## 2021-02-04 LAB — HM PAP SMEAR: HM Pap smear: NEGATIVE

## 2021-02-16 ENCOUNTER — Other Ambulatory Visit: Payer: Self-pay | Admitting: Family Medicine

## 2021-02-16 DIAGNOSIS — M109 Gout, unspecified: Secondary | ICD-10-CM

## 2021-02-16 NOTE — Telephone Encounter (Signed)
Requested medications are due for refill today.  yes  Requested medications are on the active medications list.  yes  Last refill. 02/18/2020  Future visit scheduled.   yes  Notes to clinic.  Failed protocol d/t expired and missing labs.

## 2021-03-03 ENCOUNTER — Other Ambulatory Visit: Payer: Self-pay | Admitting: Family Medicine

## 2021-03-03 DIAGNOSIS — R030 Elevated blood-pressure reading, without diagnosis of hypertension: Secondary | ICD-10-CM

## 2021-03-03 DIAGNOSIS — E1122 Type 2 diabetes mellitus with diabetic chronic kidney disease: Secondary | ICD-10-CM

## 2021-03-03 DIAGNOSIS — N1832 Chronic kidney disease, stage 3b: Secondary | ICD-10-CM

## 2021-03-03 NOTE — Telephone Encounter (Signed)
Requested Prescriptions  Pending Prescriptions Disp Refills  . spironolactone (ALDACTONE) 50 MG tablet [Pharmacy Med Name: SPIRONOLACTONE 50 MG TABLET] 180 tablet 1    Sig: TAKE 1 TABLET BY MOUTH TWICE A DAY     Cardiovascular: Diuretics - Aldosterone Antagonist Failed - 03/03/2021  1:26 PM      Failed - Cr in normal range and within 360 days    Creatinine  Date Value Ref Range Status  08/13/2019 1.5 (A) 0.5 - 1.1 Final   Creatinine, Ser  Date Value Ref Range Status  12/19/2018 1.90 (H) 0.44 - 1.00 mg/dL Final         Failed - K in normal range and within 360 days    Potassium  Date Value Ref Range Status  08/13/2019 4.5 3.4 - 5.3 Final         Failed - Na in normal range and within 360 days    Sodium  Date Value Ref Range Status  08/13/2019 136 (A) 137 - 147 Final         Passed - Last BP in normal range    BP Readings from Last 1 Encounters:  12/29/20 123/80         Passed - Valid encounter within last 6 months    Recent Outpatient Visits          2 months ago Type 2 diabetes mellitus with stage 3b chronic kidney disease, without long-term current use of insulin ([place])   [facility] Birdie Sons, MD   5 months ago Type 2 diabetes mellitus with stage 3b chronic kidney disease, without long-term current use of insulin ([place])   [facility] Birdie Sons, MD   1 year ago Type 2 diabetes mellitus with stage 3b chronic kidney disease, without long-term current use of insulin ([place])   [facility] Birdie Sons, MD   1 year ago Type 2 diabetes mellitus with stage 3b chronic kidney disease, without long-term current use of insulin ([place])   [facility] Birdie Sons, MD   1 year ago Type 1 diabetes mellitus with stage 3b chronic kidney disease [facility])   [place], Kirstie Peri, MD      Future Appointments            In 4 weeks Fisher, Kirstie Peri, MD [facility], [facility]            . glipiZIDE (GLUCOTROL XL) 2.5 MG 24 hr tablet [Pharmacy Med Name: GLIPIZIDE ER 2.5 MG TABLET] 90 tablet 0    Sig: TAKE 1 TABLET BY MOUTH EVERY DAY     Endocrinology:  Diabetes - Sulfonylureas Passed - 03/03/2021  1:26 PM      Passed - HBA1C is between 0 and 7.9 and within 180 days    Hemoglobin A1C  Date Value Ref Range Status  12/29/2020 6.9 (A) 4.0 - 5.6 % Final  08/13/2019 9.9  Final         Passed - Valid encounter within last 6 months    Recent Outpatient Visits          2 months ago Type 2 diabetes mellitus with stage 3b chronic kidney disease, without long-term current use of insulin ([place])   [facility] Birdie Sons, MD   5 months ago Type 2 diabetes mellitus with stage 3b chronic kidney disease, without long-term current use of insulin [facility])   [facility] Caryn Section, Kirstie Peri, MD  1 year ago Type 2 diabetes mellitus with stage 3b chronic kidney disease, without long-term current use of insulin ([place])   [facility] Birdie Sons, MD   1 year ago Type 2 diabetes mellitus with stage 3b chronic kidney disease, without long-term current use of insulin [facility])   [facility] Birdie Sons, MD   1 year ago Type 1 diabetes mellitus with stage 3b chronic kidney disease [facility])   [place], Kirstie Peri, MD      Future Appointments            In 4 weeks Fisher, Kirstie Peri, MD [facility], [facility]

## 2021-03-03 NOTE — Telephone Encounter (Signed)
Requested medications are due for refill today.  yes  Requested medications are on the active medications list.  yes  Last refill. 09/15/2020  Future visit scheduled.   yes  Notes to clinic.  Failed protocol d/t abnormal labs.

## 2021-03-26 ENCOUNTER — Other Ambulatory Visit: Payer: Self-pay | Admitting: Family Medicine

## 2021-03-26 DIAGNOSIS — F3178 Bipolar disorder, in full remission, most recent episode mixed: Secondary | ICD-10-CM

## 2021-03-27 NOTE — Telephone Encounter (Signed)
Requested medication (s) are due for refill today: yes  Requested medication (s) are on the active medication list: yes  Last refill:  02/27/21  Future visit scheduled: 03/31/21  Notes to clinic:  This medication can not be delegated, please assess.  Requested Prescriptions  Pending Prescriptions Disp Refills   QUEtiapine (SEROQUEL) 200 MG tablet [Pharmacy Med Name: QUEtiapine FUMARATE 200 MG TAB] 60 tablet 5    Sig: TAKE ONE TABLET BY MOUTH TWICE A DAY     Not Delegated - Psychiatry:  Antipsychotics - Second Generation (Atypical) - quetiapine Failed - 03/26/2021  8:02 PM      Failed - This refill cannot be delegated      Passed - ALT in normal range and within 180 days    ALT  Date Value Ref Range Status  12/29/2020 29 0 - 32 IU/L Final          Passed - AST in normal range and within 180 days    AST  Date Value Ref Range Status  12/29/2020 25 0 - 40 IU/L Final          Passed - Last BP in normal range    BP Readings from Last 1 Encounters:  12/29/20 123/80          Passed - Valid encounter within last 6 months    Recent Outpatient Visits           2 months ago Type 2 diabetes mellitus with stage 3b chronic kidney disease, without long-term current use of insulin ([place])   [facility] Birdie Sons, MD   6 months ago Type 2 diabetes mellitus with stage 3b chronic kidney disease, without long-term current use of insulin ([place])   [facility] Birdie Sons, MD   1 year ago Type 2 diabetes mellitus with stage 3b chronic kidney disease, without long-term current use of insulin ([place])   [facility] Birdie Sons, MD   1 year ago Type 2 diabetes mellitus with stage 3b chronic kidney disease, without long-term current use of insulin ([place])   [facility] Birdie Sons, MD   1 year ago Type 1 diabetes mellitus with stage 3b chronic kidney disease ([place])   [place], Kirstie Peri, MD        Future Appointments             In 4 days Fisher, Kirstie Peri, MD [facility], [place]

## 2021-03-31 ENCOUNTER — Ambulatory Visit (INDEPENDENT_AMBULATORY_CARE_PROVIDER_SITE_OTHER): Payer: Medicaid Other | Admitting: Family Medicine

## 2021-03-31 ENCOUNTER — Encounter: Payer: Self-pay | Admitting: Family Medicine

## 2021-03-31 ENCOUNTER — Other Ambulatory Visit: Payer: Self-pay

## 2021-03-31 VITALS — BP 107/98 | HR 98 | Temp 98.1°F | Resp 16 | Ht 63.0 in | Wt 219.7 lb

## 2021-03-31 DIAGNOSIS — F3178 Bipolar disorder, in full remission, most recent episode mixed: Secondary | ICD-10-CM

## 2021-03-31 DIAGNOSIS — N1832 Chronic kidney disease, stage 3b: Secondary | ICD-10-CM

## 2021-03-31 DIAGNOSIS — E1122 Type 2 diabetes mellitus with diabetic chronic kidney disease: Secondary | ICD-10-CM

## 2021-03-31 LAB — POCT GLYCOSYLATED HEMOGLOBIN (HGB A1C)
Est. average glucose Bld gHb Est-mCnc: 114
Hemoglobin A1C: 5.6 % (ref 4.0–5.6)

## 2021-03-31 MED ORDER — QUETIAPINE FUMARATE 200 MG PO TABS: 200.0000 mg | ORAL_TABLET | Freq: Two times a day (BID) | ORAL | 12 refills | Status: DC

## 2021-03-31 MED ORDER — METFORMIN HCL 500 MG PO TABS: 500.0000 mg | ORAL_TABLET | Freq: Every evening | ORAL | 1 refills | Status: DC

## 2021-03-31 NOTE — Progress Notes (Signed)
Established patient visit   Patient: Kim Obrien   DOB: Oct 07, 1965   55 y.o. Female  MRN: [id number] Visit Date: 03/31/2021  Today's healthcare provider: Lelon Huh, MD   Chief Complaint  Patient presents with   Follow-up    DM   Subjective    HPI HPI     Follow-up    Additional comments: DM      Last edited by Doristine Devoid, CMA on 03/31/2021 10:57 AM.      Diabetes Mellitus Type II, Follow-up  Lab Results  Component Value Date   HGBA1C 5.6 03/31/2021   HGBA1C 6.9 (A) 12/29/2020   HGBA1C 7.4 (A) 09/15/2020   Wt Readings from Last 3 Encounters:  03/31/21 219 lb 11.2 oz (99.7 kg)  12/29/20 218 lb (98.9 kg)  09/15/20 212 lb (96.2 kg)   Last seen for diabetes 3 months ago.  Management since then includes decreasing Ozempic back down to 0.25mg  once a day due to persistent nausea. She reports excellent compliance with treatment. She is not having side effects.  Symptoms: No fatigue No foot ulcerations  No appetite changes Yes nausea  No paresthesia of the feet  No polydipsia  No polyuria No visual disturbances   No vomiting     Home blood sugar records: fasting range: 120  Episodes of hypoglycemia? No  Most Recent Eye Exam: not UTD Current exercise: none Current diet habits: in general, an "unhealthy" diet  Pertinent Labs: Lab Results  Component Value Date   CHOL 166 12/29/2020   HDL 42 12/29/2020   LDLCALC 81 12/29/2020   TRIG 260 (H) 12/29/2020   CHOLHDL 4.0 12/29/2020   Lab Results  Component Value Date   NA 136 (A) 08/13/2019   K 4.5 08/13/2019   CREATININE 1.5 (A) 08/13/2019   GFRNONAA 38 08/13/2019     ---------------------------------------------------------------------------------------------------     Medications: Outpatient Medications Prior to Visit  Medication Sig   allopurinol (ZYLOPRIM) 100 MG tablet TAKE 1 TABLET BY MOUTH EVERY DAY   clonazePAM (KLONOPIN) 0.5 MG tablet Take 1 tablet (0.5 mg total) by mouth 2  (two) times daily as needed for anxiety.   dicyclomine (BENTYL) 10 MG capsule TAKE 1 CAPSULE (10 MG TOTAL) BY MOUTH 4 (FOUR) TIMES DAILY - BEFORE MEALS AND AT BEDTIME.   glipiZIDE (GLUCOTROL XL) 2.5 MG 24 hr tablet TAKE 1 TABLET BY MOUTH EVERY DAY   lovastatin (MEVACOR) 20 MG tablet Take 1 tablet (20 mg total) by mouth daily.   metFORMIN (GLUCOPHAGE) 500 MG tablet Take 1 tablet (500 mg total) by mouth every evening.   ondansetron (ZOFRAN) 4 MG tablet Take 1 tablet (4 mg total) by mouth every 8 (eight) hours as needed for nausea or vomiting.   QUEtiapine (SEROQUEL) 200 MG tablet TAKE ONE TABLET BY MOUTH TWICE A DAY   Semaglutide,0.25 or 0.5MG /DOS, (OZEMPIC, 0.25 OR 0.5 MG/DOSE,) 2 MG/1.5ML SOPN Inject 0.25 mg as directed once a week.   sodium bicarbonate 650 MG tablet Take 2 tablets (1,300 mg total) by mouth in the morning and at bedtime.   spironolactone (ALDACTONE) 50 MG tablet TAKE 1 TABLET BY MOUTH TWICE A DAY   No facility-administered medications prior to visit.         Objective    BP (!) 107/98 (BP Location: Right Arm, Patient Position: Sitting, Cuff Size: Large)    Pulse 98    Temp 98.1 F (36.7 C) (Oral)    Resp 16  Ht 5\' 3"  (1.6 m)    Wt 219 lb 11.2 oz (99.7 kg)    BMI 38.92 kg/m  {Show previous vital signs (optional):23777}  Physical Exam  General appearance: Obese female, cooperative and in no acute distress Head: Normocephalic, without obvious abnormality, atraumatic Respiratory: Respirations even and unlabored, normal respiratory rate Extremities: All extremities are intact.  Skin: Skin color, texture, turgor normal. No rashes seen  Psych: Appropriate mood and affect. Neurologic: Mental status: Alert, oriented to person, place, and time, thought content appropriate.   Results for orders placed or performed in visit on 03/31/21  POCT HgB A1C  Result Value Ref Range   Hemoglobin A1C 5.6 4.0 - 5.6 %   Est. average glucose Bld gHb Est-mCnc 114     Assessment & Plan      1. Type 2 diabetes mellitus with stage 3b chronic kidney disease, without long-term current use of insulin ([place]) Very well controlled even after reducing dose of semaglutide. Will d/c glipizide. refill metFORMIN (GLUCOPHAGE) 500 MG tablet; Take 1 tablet (500 mg total) by mouth every evening.  Dispense: 90 tablet; Refill: 1  2. Bipolar disorder, in full remission, most recent episode mixed [facility]) Doing very well on current medications. refill QUEtiapine (SEROQUEL) 200 MG tablet; Take 1 tablet (200 mg total) by mouth 2 (two) times daily.  Dispense: 60 tablet; Refill: 12      The entirety of the information documented in the History of Present Illness, Review of Systems and Physical Exam were personally obtained by me. Portions of this information were initially documented by the CMA and reviewed by me for thoroughness and accuracy.     Lelon Huh, MD  [facility] [phone] (phone) [phone] (fax)  [place]

## 2021-05-05 ENCOUNTER — Other Ambulatory Visit: Payer: Self-pay | Admitting: Family Medicine

## 2021-05-05 DIAGNOSIS — K589 Irritable bowel syndrome without diarrhea: Secondary | ICD-10-CM

## 2021-06-02 ENCOUNTER — Other Ambulatory Visit: Payer: Self-pay | Admitting: Family Medicine

## 2021-06-02 DIAGNOSIS — E1122 Type 2 diabetes mellitus with diabetic chronic kidney disease: Secondary | ICD-10-CM

## 2021-06-02 DIAGNOSIS — N1832 Chronic kidney disease, stage 3b: Secondary | ICD-10-CM

## 2021-06-02 NOTE — Telephone Encounter (Signed)
Requested medication (s) are due for refill today: yes  Requested medication (s) are on the active medication list: no  Last refill:  03/04/21  Future visit scheduled: yes  Notes to clinic:  med was dc'd on 03/31/21. Please advise     Requested Prescriptions  Pending Prescriptions Disp Refills   glipiZIDE (GLUCOTROL XL) 2.5 MG 24 hr tablet [Pharmacy Med Name: GLIPIZIDE ER 2.5 MG TABLET] 90 tablet 0    Sig: TAKE 1 TABLET BY MOUTH EVERY DAY     Endocrinology:  Diabetes - Sulfonylureas Failed - 06/02/2021  1:42 AM      Failed - Cr in normal range and within 360 days    Creatinine  Date Value Ref Range Status  08/13/2019 1.5 (A) 0.5 - 1.1 Final   Creatinine, Ser  Date Value Ref Range Status  12/19/2018 1.90 (H) 0.44 - 1.00 mg/dL Final          Passed - HBA1C is between 0 and 7.9 and within 180 days    Hemoglobin A1C  Date Value Ref Range Status  03/31/2021 5.6 4.0 - 5.6 % Final  08/13/2019 9.9  Final          Passed - Valid encounter within last 6 months    Recent Outpatient Visits           2 months ago Type 2 diabetes mellitus with stage 3b chronic kidney disease, without long-term current use of insulin ([place])   [facility] Birdie Sons, MD   5 months ago Type 2 diabetes mellitus with stage 3b chronic kidney disease, without long-term current use of insulin ([place])   [facility] Birdie Sons, MD   8 months ago Type 2 diabetes mellitus with stage 3b chronic kidney disease, without long-term current use of insulin ([place])   [facility] Birdie Sons, MD   1 year ago Type 2 diabetes mellitus with stage 3b chronic kidney disease, without long-term current use of insulin ([place])   [facility] Birdie Sons, MD   1 year ago Type 2 diabetes mellitus with stage 3b chronic kidney disease, without long-term current use of insulin [facility])   [place], Kirstie Peri, MD       Future  Appointments             In 2 months Fisher, Kirstie Peri, MD [facility], [place]

## 2021-06-02 NOTE — Telephone Encounter (Signed)
Please review

## 2021-07-05 ENCOUNTER — Other Ambulatory Visit: Payer: Self-pay | Admitting: Family Medicine

## 2021-07-05 DIAGNOSIS — N1832 Chronic kidney disease, stage 3b: Secondary | ICD-10-CM

## 2021-08-03 ENCOUNTER — Ambulatory Visit (INDEPENDENT_AMBULATORY_CARE_PROVIDER_SITE_OTHER): Payer: Medicaid Other | Admitting: Family Medicine

## 2021-08-03 ENCOUNTER — Encounter: Payer: Self-pay | Admitting: Family Medicine

## 2021-08-03 VITALS — BP 96/68 | HR 100 | Temp 98.6°F | Resp 16 | Wt 215.8 lb

## 2021-08-03 DIAGNOSIS — K760 Fatty (change of) liver, not elsewhere classified: Secondary | ICD-10-CM | POA: Diagnosis not present

## 2021-08-03 DIAGNOSIS — E119 Type 2 diabetes mellitus without complications: Secondary | ICD-10-CM | POA: Diagnosis not present

## 2021-08-03 DIAGNOSIS — R109 Unspecified abdominal pain: Secondary | ICD-10-CM

## 2021-08-03 DIAGNOSIS — I10 Essential (primary) hypertension: Secondary | ICD-10-CM

## 2021-08-03 NOTE — Progress Notes (Signed)
?  ? ? ?I,Jana Robinson,acting as a scribe for Lelon Huh, MD.,have documented all relevant documentation on the behalf of Lelon Huh, MD,as directed by  Lelon Huh, MD while in the presence of Lelon Huh, MD. ? ? ?Established patient visit ? ? ?Patient: Kim Obrien   DOB: 05-Mar-1966   56 y.o. Female  MRN: [id number] ?Visit Date: 08/03/2021 ? ?Today's healthcare provider: Lelon Huh, MD  ? ?Chief Complaint  ?Patient presents with  ? Diabetes  ? ?Subjective  ?  ?HPI ?HPI   ?Bipolar  ?Last edited by Alanson Puls, CMA on 08/03/2021 11:04 AM.  ?  ?  ?Diabetes Mellitus Type II, Follow-up ? ?Lab Results  ?Component Value Date  ? HGBA1C 5.6 03/31/2021  ? HGBA1C 6.9 (A) 12/29/2020  ? HGBA1C 7.4 (A) 09/15/2020  ? ?Wt Readings from Last 3 Encounters:  ?08/03/21 215 lb 12.8 oz (97.9 kg)  ?03/31/21 219 lb 11.2 oz (99.7 kg)  ?12/29/20 218 lb (98.9 kg)  ? ?Last seen for diabetes 4 months ago.  ?Management since then includes discontinue glipizide. Continue Metformin 500 mg every evening.  ?She reports excellent compliance with treatment. ?She is not having side effects.  ?Symptoms: ?Yes fatigue No foot ulcerations  ?No appetite changes No nausea  ?No paresthesia of the feet  No polydipsia  ?No polyuria No visual disturbances   ?No vomiting   ? ? ?Home blood sugar records: average 120 ? ?Episodes of hypoglycemia? No  ?  ?Current insulin regiment: Most Recent Eye Exam: over a year  ?Current exercise: none ?Current diet habits: well balanced ? ?Pertinent Labs: ?Lab Results  ?Component Value Date  ? CHOL 166 12/29/2020  ? HDL 42 12/29/2020  ? [place] 81 12/29/2020  ? TRIG 260 (H) 12/29/2020  ? CHOLHDL 4.0 12/29/2020  ? Lab Results  ?Component Value Date  ? NA 136 (A) 08/13/2019  ? K 4.5 08/13/2019  ? CREATININE 1.5 (A) 08/13/2019  ? GFRNONAA 38 08/13/2019  ? MICROALBUR 60 08/04/2020  ?  ? ?---------------------------------------------------------------------------------------------------  ?Follow up for bipolar  disorder  ? ?The patient was last seen for this 4 months ago. ?Changes made at last visit include continue Quetiapine 200 mg twice daily. ? ?She reports excellent compliance with treatment. ?She feels that condition is Unchanged. ?She is not having side effects.  ? ?She does complain today that she has had some abdominal cramping and gas for the last couple of weeks, mainly after dinner in the evening.  ? ?Medications: ?Outpatient Medications Prior to Visit  ?Medication Sig  ? allopurinol (ZYLOPRIM) 100 MG tablet TAKE 1 TABLET BY MOUTH EVERY DAY  ? clonazePAM (KLONOPIN) 0.5 MG tablet Take 1 tablet (0.5 mg total) by mouth 2 (two) times daily as needed for anxiety.  ? dicyclomine (BENTYL) 10 MG capsule TAKE 1 CAPSULE (10 MG TOTAL) BY MOUTH 4 (FOUR) TIMES DAILY - BEFORE MEALS AND AT BEDTIME.  ? lovastatin (MEVACOR) 20 MG tablet Take 1 tablet (20 mg total) by mouth daily.  ? metFORMIN (GLUCOPHAGE) 500 MG tablet Take 1 tablet (500 mg total) by mouth every evening.  ? ondansetron (ZOFRAN) 4 MG tablet Take 1 tablet (4 mg total) by mouth every 8 (eight) hours as needed for nausea or vomiting.  ? QUEtiapine (SEROQUEL) 200 MG tablet Take 1 tablet (200 mg total) by mouth 2 (two) times daily.  ? Semaglutide,0.25 or 0.'5MG'$ /DOS, (OZEMPIC, 0.25 OR 0.5 MG/DOSE,) 2 MG/1.5ML SOPN Inject 0.25 mg as directed once a week. (Patient taking differently: Inject  0.5 mg as directed once a week.)  ? sodium bicarbonate 650 MG tablet TAKE 2 TABLETS (1,300 MG TOTAL) BY MOUTH IN THE MORNING AND AT BEDTIME.  ? spironolactone (ALDACTONE) 50 MG tablet TAKE 1 TABLET BY MOUTH TWICE A DAY  ? ?No facility-administered medications prior to visit.  ? ? ?Review of Systems ? ? ?  Objective  ?  ?BP 96/68 (BP Location: Left Arm, Patient Position: Sitting, Cuff Size: Large)   Pulse 100   Temp 98.6 ?F (37 ?C) (Oral)   Resp 16   Wt 215 lb 12.8 oz (97.9 kg)   SpO2 97%   BMI 38.23 kg/m?  ? ? ?Physical Exam  ? ?General: Appearance:    Obese female in no acute  distress  ?Eyes:    PERRL, conjunctiva/corneas clear, EOM's intact       ?Lungs:     Clear to auscultation bilaterally, respirations unlabored  ?Heart:    Tachycardic. Normal rhythm. No murmurs, rubs, or gallops.    ?Abd:   Obese, no masses, no tenderness, BS normoactive x 4.   ?Neurologic:   Awake, alert, oriented x 3. No apparent focal neurological defect.   ?   ?  ? Assessment & Plan  ?  ? ?1. Diabetes mellitus without complication ([place]) ?Doing well current medications.  ?- Urine Albumin-Creatinine with uACR ?- Hemoglobin A1c ? ?2. Hypertension, unspecified type ?Well controlled.  Continue current medications.   ?- TSH ?- Magnesium ? ?3. Hepatic steatosis ? ? ?4. Abdominal cramping ? ?- CBC ?- Comprehensive metabolic panel ?- Lipase ?- TSH  ? ?No history abdominal surgery. Consider abdominal u/s after reviewing labs.  ?  ?   ? ?The entirety of the information documented in the History of Present Illness, Review of Systems and Physical Exam were personally obtained by me. Portions of this information were initially documented by the CMA and reviewed by me for thoroughness and accuracy.   ? ? ?Lelon Huh, MD  ?[facility] ?[phone] (phone) ?[phone] (fax) ? ?[facility] ?

## 2021-08-04 LAB — CBC
Hematocrit: 40.9 % (ref 34.0–46.6)
Hemoglobin: 13.8 g/dL (ref 11.1–15.9)
MCH: 31.2 pg (ref 26.6–33.0)
MCHC: 33.7 g/dL (ref 31.5–35.7)
MCV: 92 fL (ref 79–97)
Platelets: 195 10*3/uL (ref 150–450)
RBC: 4.43 x10E6/uL (ref 3.77–5.28)
RDW: 13.6 % (ref 11.7–15.4)
WBC: 5.9 10*3/uL (ref 3.4–10.8)

## 2021-08-04 LAB — SPECIMEN STATUS REPORT

## 2021-08-04 LAB — COMPREHENSIVE METABOLIC PANEL
ALT: 37 IU/L — ABNORMAL HIGH (ref 0–32)
AST: 34 IU/L (ref 0–40)
Albumin/Globulin Ratio: 1.6 (ref 1.2–2.2)
Albumin: 4.2 g/dL (ref 3.8–4.9)
Alkaline Phosphatase: 70 IU/L (ref 44–121)
BUN/Creatinine Ratio: 9 (ref 9–23)
BUN: 15 mg/dL (ref 6–24)
Bilirubin Total: 0.4 mg/dL (ref 0.0–1.2)
CO2: 21 mmol/L (ref 20–29)
Calcium: 9.2 mg/dL (ref 8.7–10.2)
Chloride: 107 mmol/L — ABNORMAL HIGH (ref 96–106)
Creatinine, Ser: 1.74 mg/dL — ABNORMAL HIGH (ref 0.57–1.00)
Globulin, Total: 2.6 g/dL (ref 1.5–4.5)
Glucose: 158 mg/dL — ABNORMAL HIGH (ref 70–99)
Potassium: 4.6 mmol/L (ref 3.5–5.2)
Sodium: 141 mmol/L (ref 134–144)
Total Protein: 6.8 g/dL (ref 6.0–8.5)
eGFR: 34 mL/min/{1.73_m2} — ABNORMAL LOW (ref >59)

## 2021-08-04 LAB — HEMOGLOBIN A1C
Est. average glucose Bld gHb Est-mCnc: 154 mg/dL
Hgb A1c MFr Bld: 7 % — ABNORMAL HIGH (ref 4.8–5.6)

## 2021-08-04 LAB — MICROALBUMIN / CREATININE URINE RATIO
Creatinine, Urine: 91.9 mg/dL
Microalb/Creat Ratio: 22 mg/g creat (ref 0–29)
Microalbumin, Urine: 20.2 ug/mL

## 2021-08-04 LAB — MAGNESIUM: Magnesium: 1.8 mg/dL (ref 1.6–2.3)

## 2021-08-04 LAB — TSH: TSH: 0.769 u[IU]/mL (ref 0.450–4.500)

## 2021-08-04 LAB — LIPASE: Lipase: 54 U/L (ref 14–72)

## 2021-08-06 MED ORDER — SEMAGLUTIDE (1 MG/DOSE) 4 MG/3ML ~~LOC~~ SOPN: 1.0000 mg | PEN_INJECTOR | SUBCUTANEOUS | 5 refills | Status: DC

## 2021-08-25 ENCOUNTER — Other Ambulatory Visit: Payer: Self-pay | Admitting: Family Medicine

## 2021-08-25 DIAGNOSIS — E1122 Type 2 diabetes mellitus with diabetic chronic kidney disease: Secondary | ICD-10-CM

## 2021-08-27 ENCOUNTER — Other Ambulatory Visit: Payer: Self-pay | Admitting: Family Medicine

## 2021-08-27 DIAGNOSIS — R030 Elevated blood-pressure reading, without diagnosis of hypertension: Secondary | ICD-10-CM

## 2021-09-12 ENCOUNTER — Other Ambulatory Visit: Payer: Self-pay | Admitting: Family Medicine

## 2021-09-12 DIAGNOSIS — K589 Irritable bowel syndrome without diarrhea: Secondary | ICD-10-CM

## 2021-09-17 ENCOUNTER — Other Ambulatory Visit: Payer: Self-pay | Admitting: Family Medicine

## 2021-09-17 DIAGNOSIS — E1169 Type 2 diabetes mellitus with other specified complication: Secondary | ICD-10-CM

## 2021-09-25 ENCOUNTER — Emergency Department (HOSPITAL_COMMUNITY): Payer: Medicaid Other

## 2021-09-25 ENCOUNTER — Emergency Department (HOSPITAL_COMMUNITY)
Admission: EM | Admit: 2021-09-25 | Discharge: 2021-09-25 | Disposition: A | Discharge status: Home / Self Care | Payer: Medicaid Other | Attending: Emergency Medicine | Admitting: Emergency Medicine

## 2021-09-25 ENCOUNTER — Encounter (HOSPITAL_COMMUNITY): Payer: Self-pay

## 2021-09-25 DIAGNOSIS — N3000 Acute cystitis without hematuria: Secondary | ICD-10-CM | POA: Insufficient documentation

## 2021-09-25 DIAGNOSIS — E119 Type 2 diabetes mellitus without complications: Secondary | ICD-10-CM | POA: Insufficient documentation

## 2021-09-25 DIAGNOSIS — Z7984 Long term (current) use of oral hypoglycemic drugs: Secondary | ICD-10-CM | POA: Diagnosis not present

## 2021-09-25 DIAGNOSIS — R112 Nausea with vomiting, unspecified: Secondary | ICD-10-CM

## 2021-09-25 DIAGNOSIS — D72829 Elevated white blood cell count, unspecified: Secondary | ICD-10-CM | POA: Insufficient documentation

## 2021-09-25 HISTORY — DX: Type 2 diabetes mellitus without complications: E11.9

## 2021-09-25 LAB — HEPATIC FUNCTION PANEL
ALT: 37 U/L (ref 0–44)
AST: 35 U/L (ref 15–41)
Albumin: 4 g/dL (ref 3.5–5.0)
Alkaline Phosphatase: 58 U/L (ref 38–126)
Bilirubin, Direct: 0.2 mg/dL (ref 0.0–0.2)
Indirect Bilirubin: 0.7 mg/dL (ref 0.3–0.9)
Total Bilirubin: 0.9 mg/dL (ref 0.3–1.2)
Total Protein: 7.2 g/dL (ref 6.5–8.1)

## 2021-09-25 LAB — URINALYSIS, ROUTINE W REFLEX MICROSCOPIC
Bilirubin Urine: NEGATIVE
Glucose, UA: 50 mg/dL — AB
Hgb urine dipstick: NEGATIVE
Ketones, ur: NEGATIVE mg/dL
Nitrite: NEGATIVE
Protein, ur: NEGATIVE mg/dL
Specific Gravity, Urine: 1.009 (ref 1.005–1.030)
WBC, UA: >50 WBC/hpf — ABNORMAL HIGH (ref 0–5)
pH: 6 (ref 5.0–8.0)

## 2021-09-25 LAB — CBC
HCT: 46.5 % — ABNORMAL HIGH (ref 36.0–46.0)
Hemoglobin: 15.6 g/dL — ABNORMAL HIGH (ref 12.0–15.0)
MCH: 31.5 pg (ref 26.0–34.0)
MCHC: 33.5 g/dL (ref 30.0–36.0)
MCV: 93.8 fL (ref 80.0–100.0)
Platelets: 237 10*3/uL (ref 150–400)
RBC: 4.96 MIL/uL (ref 3.87–5.11)
RDW: 13.4 % (ref 11.5–15.5)
WBC: 11 10*3/uL — ABNORMAL HIGH (ref 4.0–10.5)
nRBC: 0 % (ref 0.0–0.2)

## 2021-09-25 LAB — BASIC METABOLIC PANEL
Anion gap: 12 (ref 5–15)
BUN: 15 mg/dL (ref 6–20)
CO2: 20 mmol/L — ABNORMAL LOW (ref 22–32)
Calcium: 9.5 mg/dL (ref 8.9–10.3)
Chloride: 106 mmol/L (ref 98–111)
Creatinine, Ser: 1.98 mg/dL — ABNORMAL HIGH (ref 0.44–1.00)
GFR, Estimated: 29 mL/min — ABNORMAL LOW (ref >60)
Glucose, Bld: 240 mg/dL — ABNORMAL HIGH (ref 70–99)
Potassium: 3.9 mmol/L (ref 3.5–5.1)
Sodium: 138 mmol/L (ref 135–145)

## 2021-09-25 LAB — I-STAT BETA HCG BLOOD, ED (MC, WL, AP ONLY): I-stat hCG, quantitative: 7.6 m[IU]/mL — ABNORMAL HIGH (ref <5)

## 2021-09-25 LAB — CBG MONITORING, ED
Glucose-Capillary: 238 mg/dL — ABNORMAL HIGH (ref 70–99)
Glucose-Capillary: 207 mg/dL — ABNORMAL HIGH (ref 70–99)
Glucose-Capillary: 220 mg/dL — ABNORMAL HIGH (ref 70–99)

## 2021-09-25 LAB — LIPASE, BLOOD: Lipase: 44 U/L (ref 11–51)

## 2021-09-25 MED ORDER — ONDANSETRON HCL 4 MG/2ML IJ SOLN
4.0000 mg | Freq: Once | INTRAMUSCULAR | Status: AC
  Administered 2021-09-25: 4 mg via INTRAVENOUS
  Filled 2021-09-25: qty 2

## 2021-09-25 MED ORDER — CEPHALEXIN 500 MG PO CAPS: 500.0000 mg | ORAL_CAPSULE | Freq: Two times a day (BID) | ORAL | 0 refills | Status: DC

## 2021-09-25 MED ORDER — SODIUM CHLORIDE 0.9 % IV BOLUS
1000.0000 mL | Freq: Once | INTRAVENOUS | Status: AC
  Administered 2021-09-25: 1000 mL via INTRAVENOUS

## 2021-09-25 MED ORDER — FENTANYL CITRATE PF 50 MCG/ML IJ SOSY
50.0000 ug | PREFILLED_SYRINGE | Freq: Once | INTRAMUSCULAR | Status: AC
  Administered 2021-09-25: 50 ug via INTRAVENOUS
  Filled 2021-09-25: qty 1

## 2021-09-25 NOTE — Discharge Instructions (Addendum)
Your laboratory results were within normal limits.  We discussed the results of your CT scan.  I do suggest a bowel regimen consisting with MiraLAX along with stool softeners to help with evacuation of your bowels.  Your urine showed signs of infection, I have prescribed antibiotics in order to help treat your infection.  Please take 1 tablet twice a day for the next 7 days.

## 2021-09-25 NOTE — ED Triage Notes (Signed)
Pt comes via [facility] for n/v x 3 days, hx of DM and kidney disease. Pt also c/o of dizziness. Took zofran PTA

## 2021-09-25 NOTE — ED Provider Notes (Signed)
[facility] EMERGENCY DEPARTMENT Provider Note   CSN: [id number] Arrival date & time: 09/25/21  8144     History  Chief Complaint  Patient presents with   Vomiting    Kim Obrien is a 56 y.o. female.  56 y.o female with a PMH of IBS, DM presents to the ED with a chief complaint of nausea, vomiting x 3 days. Patient reports multiple episodes of nonbilious non bloody emesis, states she has hardly been able to keep anything down. She has tried taking zofran but there has been no improvement in her symptoms. Patient is endorses intermittent cramping to her abdomen every 5-10 minutes without any focal point of tenderness. Husband at the bedside reports long stand history of "problems with her stomach, previously diagnosed with IBS" but states this is somewhat different today. Last bowel movement was this morning without any abnormality. She denies any fever, chest pain, urinary symptoms or other complaints.  Prior surgical hx of her fallopian tubes several years ago. No sick contacts, no new medications.   The history is provided by the patient, the spouse and medical records.       Home Medications Prior to Admission medications   Medication Sig Start Date End Date Taking? Authorizing Provider  allopurinol (ZYLOPRIM) 100 MG tablet TAKE 1 TABLET BY MOUTH EVERY DAY Patient taking differently: Take 100 mg by mouth daily. 02/17/21  Yes Birdie Sons, MD  Calcium Carbonate Antacid (TUMS PO) Take 2 tablets by mouth every other day.   Yes [provider]  cephALEXin (KEFLEX) 500 MG capsule Take 1 capsule (500 mg total) by mouth 2 (two) times daily for 7 days. 09/25/21 10/02/21 Yes Ason Heslin, Beverley Fiedler, PA-C  clonazePAM (KLONOPIN) 0.5 MG tablet Take 1 tablet (0.5 mg total) by mouth 2 (two) times daily as needed for anxiety. Patient taking differently: Take 0.5 mg by mouth daily as needed for anxiety. 07/20/18  Yes Birdie Sons, MD  dicyclomine (BENTYL) 10 MG capsule TAKE  1 CAPSULE (10 MG TOTAL) BY MOUTH 4 TIMES A DAY BEFORE MEALS AND AT BEDTIME Patient taking differently: Take 10 mg by mouth in the morning and at bedtime. 09/14/21  Yes Birdie Sons, MD  FIBER PO Take 1 tablet by mouth daily.   Yes [provider]  losartan (COZAAR) 25 MG tablet Take 25 mg by mouth at bedtime. 09/09/21  Yes [provider]  lovastatin (MEVACOR) 20 MG tablet TAKE 1 TABLET BY MOUTH EVERY DAY Patient taking differently: Take 20 mg by mouth daily. 09/18/21  Yes Birdie Sons, MD  metFORMIN (GLUCOPHAGE) 500 MG tablet TAKE 1 TABLET BY MOUTH EVERY EVENING. Patient taking differently: Take 500 mg by mouth at bedtime. 08/25/21  Yes Birdie Sons, MD  ondansetron (ZOFRAN) 4 MG tablet Take 1 tablet (4 mg total) by mouth every 8 (eight) hours as needed for nausea or vomiting. Patient taking differently: Take 4 mg by mouth daily as needed for nausea or vomiting. 12/29/20  Yes Birdie Sons, MD  QUEtiapine (SEROQUEL) 200 MG tablet Take 1 tablet (200 mg total) by mouth 2 (two) times daily. 03/31/21  Yes Birdie Sons, MD  Semaglutide, 1 MG/DOSE, 4 MG/3ML SOPN Inject 1 mg as directed once a week. 08/06/21  Yes Birdie Sons, MD  sodium bicarbonate 650 MG tablet TAKE 2 TABLETS (1,300 MG TOTAL) BY MOUTH IN THE MORNING AND AT BEDTIME. Patient taking differently: Take 1,300 mg by mouth 2 (two) times daily. 07/05/21 07/05/22  Yes Birdie Sons, MD  spironolactone (ALDACTONE) 50 MG tablet TAKE 1 TABLET BY MOUTH TWICE A DAY Patient taking differently: Take 50 mg by mouth daily. 08/27/21  Yes Birdie Sons, MD  FARXIGA 10 MG TABS tablet Take 10 mg by mouth daily. Patient not taking: Reported on 09/25/2021 09/23/21   [provider]      Allergies    Patient has no known allergies.    Review of Systems   Review of Systems  Constitutional:  Negative for chills and fever.  HENT:  Negative for sore throat.   Respiratory:  Negative for shortness of breath.    Cardiovascular:  Negative for chest pain.  Gastrointestinal:  Positive for abdominal pain, nausea and vomiting. Negative for blood in stool and diarrhea.  Genitourinary:  Negative for flank pain.  Musculoskeletal:  Negative for back pain.  Neurological:  Negative for light-headedness and headaches.  All other systems reviewed and are negative.   Physical Exam Updated Vital Signs BP 112/78   Pulse 93   Temp 98.4 F (36.9 C)   Resp (!) 24   SpO2 98%  Physical Exam Vitals and nursing note reviewed.  Constitutional:      Appearance: She is ill-appearing.  HENT:     Head: Normocephalic and atraumatic.     Mouth/Throat:     Mouth: Mucous membranes are moist.  Cardiovascular:     Rate and Rhythm: Normal rate.  Pulmonary:     Effort: Pulmonary effort is normal.     Breath sounds: No wheezing.  Abdominal:     General: Abdomen is flat. Bowel sounds are decreased. There is distension.     Palpations: Abdomen is soft.     Tenderness: There is generalized abdominal tenderness. There is guarding. There is no right CVA tenderness or left CVA tenderness.     Hernia: No hernia is present.  Musculoskeletal:     Cervical back: Normal range of motion and neck supple.  Skin:    General: Skin is warm and dry.  Neurological:     Mental Status: She is alert and oriented to person, place, and time.     ED Results / Procedures / Treatments   Labs (all labs ordered are listed, but only abnormal results are displayed) Labs Reviewed  BASIC METABOLIC PANEL - Abnormal; Notable for the following components:      Result Value   CO2 20 (*)    Glucose, Bld 240 (*)    Creatinine, Ser 1.98 (*)    GFR, Estimated 29 (*)    All other components within normal limits  CBC - Abnormal; Notable for the following components:   WBC 11.0 (*)    Hemoglobin 15.6 (*)    HCT 46.5 (*)    All other components within normal limits  URINALYSIS, ROUTINE W REFLEX MICROSCOPIC - Abnormal; Notable for the following  components:   APPearance HAZY (*)    Glucose, UA 50 (*)    Leukocytes,Ua LARGE (*)    WBC, UA >50 (*)    Bacteria, UA MANY (*)    All other components within normal limits  CBG MONITORING, ED - Abnormal; Notable for the following components:   Glucose-Capillary 238 (*)    All other components within normal limits  I-STAT BETA HCG BLOOD, ED ([facility], [facility], [facility] ONLY) - Abnormal; Notable for the following components:   I-stat hCG, quantitative 7.6 (*)    All other components within normal limits  CBG MONITORING, ED - Abnormal;  Notable for the following components:   Glucose-Capillary 220 (*)    All other components within normal limits  CBG MONITORING, ED - Abnormal; Notable for the following components:   Glucose-Capillary 207 (*)    All other components within normal limits  URINE CULTURE  HEPATIC FUNCTION PANEL  LIPASE, BLOOD    EKG None  Radiology CT ABDOMEN PELVIS WO CONTRAST  Result Date: 09/25/2021 CLINICAL DATA:  Abdominal pain, acute nonlocalized. Generalized abdominal pain. History of diabetes and left salpingo oophorectomy. EXAM: CT ABDOMEN AND PELVIS WITHOUT CONTRAST TECHNIQUE: Multidetector CT imaging of the abdomen and pelvis was performed following the standard protocol without IV contrast. RADIATION DOSE REDUCTION: This exam was performed according to the departmental dose-optimization program which includes automated exposure control, adjustment of the mA and/or kV according to patient size and/or use of iterative reconstruction technique. COMPARISON:  Noncontrast abdominopelvic CT 11/04/2017 FINDINGS: Lower chest: Clear lung bases. No significant pleural or pericardial effusion. Small hiatal hernia. Hepatobiliary: The liver demonstrates diffusely decreased density consistent with steatosis. No focal abnormality identified on noncontrast imaging. No evidence of gallstones, gallbladder wall thickening or biliary dilatation. Pancreas: Unremarkable. No pancreatic ductal dilatation or  surrounding inflammatory changes. Spleen: Normal in size without focal abnormality. Adrenals/Urinary Tract: Both adrenal glands appear normal. The kidneys appear normal without evidence of urinary tract calculus, suspicious lesion or hydronephrosis. Probable pelvic floor laxity. There is a small amount of gas within the urinary bladder lumen. No evidence of bladder wall thickening or surrounding inflammation. Stomach/Bowel: No enteric contrast administered. The stomach appears unremarkable for its degree of distension. No evidence of bowel wall thickening, distention or surrounding inflammatory change. There is prominent high density stool within the distal colon, and the proximal colon is fluid-filled without significant distension. Vascular/Lymphatic: There are no enlarged abdominal or pelvic lymph nodes. Minimal aortic atherosclerosis. Reproductive: Interval surgical excision of the large pelvic mass demonstrated on the prior examination. The right ovary and uterus appear normal. No recurrent mass lesion identified. Other: Intact abdominal wall.  No ascites or peritoneal nodularity. Musculoskeletal: No acute or significant osseous findings. Lower lumbar facet arthropathy. IMPRESSION: 1. No acute findings or explanation for the patient's symptoms identified. 2. Interval surgical excision of previously demonstrated large pelvic mass. No evidence of recurrent pelvic mass or metastatic disease. 3. Prominent stool in the distal colon, suggesting constipation. 4. Small amount of gas in the bladder lumen, nonspecific and potentially iatrogenic. Correlate clinically. Underlying pelvic floor laxity. 5. Hepatic steatosis. 6. Mild Aortic Atherosclerosis (ICD10-I70.0). Electronically Signed   By: Richardean Sale M.D.   On: 09/25/2021 08:28    Procedures Procedures    Medications Ordered in ED Medications  sodium chloride 0.9 % bolus 1,000 mL (0 mLs Intravenous Stopped 09/25/21 1159)  ondansetron (ZOFRAN) injection 4  mg (4 mg Intravenous Given 09/25/21 0749)  fentaNYL (SUBLIMAZE) injection 50 mcg (50 mcg Intravenous Given 09/25/21 0750)    ED Course/ Medical Decision Making/ A&P Clinical Course as of 09/25/21 1210  Fri Sep 25, 2021  1206 Leukocytes,Ua(!): LARGE [JS]  1206 WBC, UA(!): >50 [JS]  1206 Bacteria, UA(!): MANY [JS]    Clinical Course User Index [JS] Janeece Fitting, PA-C                           Medical Decision Making Amount and/or Complexity of Data Reviewed Labs: ordered. Decision-making details documented in ED Course. Radiology: ordered.  Risk Prescription drug management.   This patient presents to the  ED for concern of nausea and vomiting, this involves a number of treatment options, and is a complaint that carries with it a high risk of complications and morbidity.  The differential diagnosis includes bowel obstruction, gastroenteritis, flare up of her IBS.    Co morbidities: Discussed in HPI   Brief History:  Patient here with 2-3 days of nausea and vomiting with decrease oral intake. No relieve with zofran. No prior episodes similar to these. Diabetic with BS controlled with Ozempic. No new meds, no recent travel, no sick contacts. No urinary symptoms.   EMR reviewed including pt PMHx, past surgical history and past visits to ER.   See HPI for more details   Lab Tests:  I ordered and independently interpreted labs.  The pertinent results include:    Labs notable for slightly leukocytosis, likely reactive due to ongoing vomiting. BMP without any electrolyte derangement despite vomiting. Creatine is 1. 98, which is consistent with her prior baseline. CBG 207 within her baseline.  Additional labs were added, hepatic function is within normal limits.  Her lipase level is normal.   Imaging Studies:  CT abdomen: 1. No acute findings or explanation for the patient's symptoms  identified.  2. Interval surgical excision of previously demonstrated large  pelvic mass. No  evidence of recurrent pelvic mass or metastatic  disease.  3. Prominent stool in the distal colon, suggesting constipation.  4. Small amount of gas in the bladder lumen, nonspecific and  potentially iatrogenic. Correlate clinically. Underlying pelvic  floor laxity.  5. Hepatic steatosis.  6. Mild Aortic Atherosclerosis (ICD10-I70.0).    Cardiac Monitoring:  The patient was maintained on a cardiac monitor.  I personally viewed and interpreted the cardiac monitored which showed an underlying rhythm of: sonus tach 113 EKG non-ischemic   Medicines ordered:  I ordered medication including bolus, fentanyl  for symptomatic treatment Reevaluation of the patient after these medicines showed that the patient improved I have reviewed the patients home medicines and have made adjustments as needed  Reevaluation:  After the interventions noted above I re-evaluated patient and found that they have :improved   Social Determinants of Health:  The patient's social determinants of health were a factor in the care of this patient    Problem List / ED Course:  Patient here with abdominal pain, nausea, vomiting for the past 3 days.  No fevers.  Labs are overall within normal limits in the ED.  Given fentanyl, Zofran, bolus for symptomatic treatment with improvement in her symptoms.  CT is negative but remarkable for some constipation, although she did have a bowel movement this morning which she reports she had to strain for.  She is hemodynamically stable overall, no other symptoms reported.  No urinary symptoms, UA is pending.  Discussed CT results findings with patient's, I did recommend bowel regimen at home. UA with many bacteria, WBC >50, large leukocytes.  Urine consistent with urinary tract infection, although patient reports that she is not symptomatic, I do think that generalized abdominal pain with nausea and vomiting likely causing her symptoms.  I personally called lab in order to obtain  a urine culture. Patient will be given a short prescription for Keflex to help with symptomatic treatment.  If urine culture grows a different species she will be called to have antibiotics changed.  He is agreeable with this plan and treatment, patient hemodynamically stable for discharge.   Dispostion:  After consideration of the diagnostic results and the patients response to treatment,  I feel that the patent would benefit from outpatient treatment with miralax and stool softeners to help with passing of stool along with treatment of her UTI with a 7-day course of Keflex.     Portions of this note were generated with [employer]. Dictation errors may occur despite best attempts at proofreading.   Final Clinical Impression(s) / ED Diagnoses Final diagnoses:  Nausea and vomiting, unspecified vomiting type  Acute cystitis without hematuria    Rx / DC Orders ED Discharge Orders          Ordered    cephALEXin (KEFLEX) 500 MG capsule  2 times daily        09/25/21 1207              Janeece Fitting, PA-C 09/25/21 1210    Blanchie Dessert, MD 09/29/21 [phone]

## 2021-09-28 LAB — URINE CULTURE: Culture: >=100000 — AB

## 2021-09-29 ENCOUNTER — Telehealth: Payer: Self-pay

## 2021-09-29 NOTE — Telephone Encounter (Signed)
Post ED Visit - Positive Culture Follow-up  Culture report reviewed by antimicrobial stewardship pharmacist: [place] Team '[x]'$  Mal Misty [place], [place].D. '[]'$  Heide Guile, Pharm.D., BCPS AQ-ID '[]'$  Parks Neptune, Pharm.D., BCPS '[]'$  Alycia Rossetti, Pharm.D., BCPS '[]'$  [place], [place].D., BCPS, AAHIVP '[]'$  Legrand Como, Pharm.D., BCPS, AAHIVP '[]'$  Salome Arnt, PharmD, BCPS '[]'$  Johnnette Gourd, PharmD, BCPS '[]'$  Hughes Better, PharmD, BCPS '[]'$  Leeroy Cha, PharmD '[]'$  Laqueta Linden, PharmD, BCPS '[]'$  Albertina Parr, PharmD  [place] Team '[]'$  Leodis Sias, PharmD '[]'$  Lindell Spar, PharmD '[]'$  Royetta Asal, PharmD '[]'$  Graylin Shiver, Rph '[]'$  Rema Fendt) Glennon Mac, PharmD '[]'$  Arlyn Dunning, PharmD '[]'$  Netta Cedars, PharmD '[]'$  Dia Sitter, PharmD '[]'$  Leone Haven, PharmD '[]'$  Gretta Arab, PharmD '[]'$  Theodis Shove, PharmD '[]'$  Peggyann Juba, PharmD '[]'$  Reuel Boom, PharmD   Positive urine culture Treated with Cephalexin, organism sensitive to the same and no further patient follow-up is required at this time.  Glennon Hamilton 09/29/2021, 10:34 AM

## 2021-10-02 ENCOUNTER — Ambulatory Visit (INDEPENDENT_AMBULATORY_CARE_PROVIDER_SITE_OTHER): Payer: Medicaid Other | Admitting: Family Medicine

## 2021-10-02 ENCOUNTER — Encounter: Payer: Self-pay | Admitting: Family Medicine

## 2021-10-02 VITALS — BP 112/74 | HR 111 | Temp 97.7°F | Resp 16 | Wt 216.0 lb

## 2021-10-02 DIAGNOSIS — N3000 Acute cystitis without hematuria: Secondary | ICD-10-CM | POA: Diagnosis not present

## 2021-10-02 DIAGNOSIS — K5900 Constipation, unspecified: Secondary | ICD-10-CM | POA: Diagnosis not present

## 2021-10-02 DIAGNOSIS — R112 Nausea with vomiting, unspecified: Secondary | ICD-10-CM

## 2021-10-02 LAB — POCT URINALYSIS DIPSTICK
Bilirubin, UA: NEGATIVE
Blood, UA: NEGATIVE
Glucose, UA: NEGATIVE
Ketones, UA: NEGATIVE
Nitrite, UA: NEGATIVE
Protein, UA: NEGATIVE
Spec Grav, UA: 1.01 (ref 1.010–1.025)
Urobilinogen, UA: 0.2 E.U./dL
pH, UA: 6 (ref 5.0–8.0)

## 2021-10-02 NOTE — Progress Notes (Signed)
I,Roshena L Chambers,acting as a scribe for Lelon Huh, MD.,have documented all relevant documentation on the behalf of Lelon Huh, MD,as directed by  Lelon Huh, MD while in the presence of Lelon Huh, MD.   Established patient visit   Patient: Kim Obrien   DOB: Oct 08, 1965   56 y.o. Female  MRN: [id number] Visit Date: 10/02/2021  Today's healthcare provider: Lelon Huh, MD   Chief Complaint  Patient presents with   Follow-up   Subjective    HPI  Follow up ER visit  Patient was seen in ER for nausea and vomiting on 09/25/2021. She was treated for acute cystitis. Culture grew >100k klebsiella sensitive to cephalosporins.  Treatment for this included prescribing cephalexin. She reports good compliance with treatment. She reports this condition is Improved. Her only presenting symptoms were nausea and vomiting, which has now completely resolved. She was also noted to have constipation on abdominal CT and was advised to take Miralax and she states Bms are now back to normal.   -----------------------------------------------------------------------------------------   Medications: Outpatient Medications Prior to Visit  Medication Sig   allopurinol (ZYLOPRIM) 100 MG tablet TAKE 1 TABLET BY MOUTH EVERY DAY (Patient taking differently: Take 100 mg by mouth daily.)   Calcium Carbonate Antacid (TUMS PO) Take 2 tablets by mouth every other day.   clonazePAM (KLONOPIN) 0.5 MG tablet Take 1 tablet (0.5 mg total) by mouth 2 (two) times daily as needed for anxiety. (Patient taking differently: Take 0.5 mg by mouth daily as needed for anxiety.)   dicyclomine (BENTYL) 10 MG capsule TAKE 1 CAPSULE (10 MG TOTAL) BY MOUTH 4 TIMES A DAY BEFORE MEALS AND AT BEDTIME (Patient taking differently: Take 10 mg by mouth in the morning and at bedtime.)   FARXIGA 10 MG TABS tablet Take 10 mg by mouth daily.   FIBER PO Take 1 tablet by mouth daily.   losartan (COZAAR) 25 MG tablet Take 25  mg by mouth at bedtime.   lovastatin (MEVACOR) 20 MG tablet TAKE 1 TABLET BY MOUTH EVERY DAY (Patient taking differently: Take 20 mg by mouth daily.)   metFORMIN (GLUCOPHAGE) 500 MG tablet TAKE 1 TABLET BY MOUTH EVERY EVENING. (Patient taking differently: Take 500 mg by mouth at bedtime.)   ondansetron (ZOFRAN) 4 MG tablet Take 1 tablet (4 mg total) by mouth every 8 (eight) hours as needed for nausea or vomiting. (Patient taking differently: Take 4 mg by mouth daily as needed for nausea or vomiting.)   QUEtiapine (SEROQUEL) 200 MG tablet Take 1 tablet (200 mg total) by mouth 2 (two) times daily.   Semaglutide, 1 MG/DOSE, 4 MG/3ML SOPN Inject 1 mg as directed once a week.   sodium bicarbonate 650 MG tablet TAKE 2 TABLETS (1,300 MG TOTAL) BY MOUTH IN THE MORNING AND AT BEDTIME. (Patient taking differently: Take 1,300 mg by mouth 2 (two) times daily.)   spironolactone (ALDACTONE) 50 MG tablet TAKE 1 TABLET BY MOUTH TWICE A DAY (Patient taking differently: Take 50 mg by mouth daily.)   [DISCONTINUED] cephALEXin (KEFLEX) 500 MG capsule Take 1 capsule (500 mg total) by mouth 2 (two) times daily for 7 days. (Patient not taking: Reported on 10/02/2021)   No facility-administered medications prior to visit.    Review of Systems  Constitutional:  Negative for appetite change, chills, fatigue and fever.  Respiratory:  Negative for chest tightness and shortness of breath.   Cardiovascular:  Negative for chest pain and palpitations.  Gastrointestinal:  Negative for abdominal  pain, nausea and vomiting.  Neurological:  Negative for dizziness and weakness.       Objective    BP 112/74 (BP Location: Right Arm, Patient Position: Sitting, Cuff Size: Large)   Pulse (!) 111   Temp 97.7 F (36.5 C) (Oral)   Resp 16   Wt 216 lb (98 kg)   SpO2 97% Comment: room air  BMI 38.26 kg/m    Physical Exam  General appearance: Obese female, cooperative and in no acute distress Head: Normocephalic, without obvious  abnormality, atraumatic Respiratory: Respirations even and unlabored, normal respiratory rate Extremities: All extremities are intact.  Skin: Skin color, texture, turgor normal. No rashes seen  Psych: Appropriate mood and affect. Neurologic: Mental status: Alert, oriented to person, place, and time, thought content appropriate.   Results for orders placed or performed in visit on 10/02/21  POCT Urinalysis Dipstick  Result Value Ref Range   Color, UA yellow    Clarity, UA clear    Glucose, UA Negative Negative   Bilirubin, UA negative    Ketones, UA negative    Spec Grav, UA 1.010 1.010 - 1.025   Blood, UA negative    pH, UA 6.0 5.0 - 8.0   Protein, UA Negative Negative   Urobilinogen, UA 0.2 0.2 or 1.0 E.U./dL   Nitrite, UA negative    Leukocytes, UA Trace (A) Negative   Appearance     Odor      Assessment & Plan     1. Acute cystitis without hematuria Resolved.   2. Nausea and vomiting, unspecified vomiting type Secondary to UTI above and now resolved.     3. Constipation, unspecified constipation type Better with Miralax. Recommend daily OTC stool softener and fiber supplements.      The entirety of the information documented in the History of Present Illness, Review of Systems and Physical Exam were personally obtained by me. Portions of this information were initially documented by the CMA and reviewed by me for thoroughness and accuracy.     Lelon Huh, MD  [facility] [phone] (phone) [phone] (fax)  [place]

## 2021-10-28 LAB — HM DIABETES EYE EXAM

## 2021-11-13 ENCOUNTER — Encounter: Payer: Self-pay | Admitting: Family Medicine

## 2021-11-18 ENCOUNTER — Encounter: Payer: Self-pay | Admitting: Family Medicine

## 2021-11-18 ENCOUNTER — Ambulatory Visit (INDEPENDENT_AMBULATORY_CARE_PROVIDER_SITE_OTHER): Payer: Medicaid Other | Admitting: Family Medicine

## 2021-11-18 VITALS — BP 102/70 | HR 98 | Temp 97.6°F | Resp 16 | Wt 210.4 lb

## 2021-11-18 DIAGNOSIS — N1832 Chronic kidney disease, stage 3b: Secondary | ICD-10-CM | POA: Diagnosis not present

## 2021-11-18 DIAGNOSIS — E1122 Type 2 diabetes mellitus with diabetic chronic kidney disease: Secondary | ICD-10-CM

## 2021-11-18 DIAGNOSIS — R112 Nausea with vomiting, unspecified: Secondary | ICD-10-CM

## 2021-11-18 NOTE — Progress Notes (Signed)
I,Sulibeya S Dimas,acting as a scribe for Lelon Huh, MD.,have documented all relevant documentation on the behalf of Lelon Huh, MD,as directed by  Lelon Huh, MD while in the presence of Lelon Huh, MD.   Established patient visit   Patient: Kim Obrien   DOB: [birth year]   56 y.o. Female  MRN: [id number] Visit Date: 11/18/2021  Today's healthcare provider: Lelon Huh, MD   Chief Complaint  Patient presents with   Emesis   Subjective    Emesis  This is a recurrent problem. The current episode started 1 to 4 weeks ago. The problem occurs less than 2 times per day. The problem has been waxing and waning. There has been no fever. Associated symptoms include abdominal pain and weight loss. Pertinent negatives include no arthralgias, chest pain, chills, coughing, diarrhea, dizziness, fever, headaches, myalgias, sweats or URI. Treatments tried: Zofran. The treatment provided mild relief.  She states pain is mainly in epigastrium and gets worse when she eats. Patient reports she did start Farxiga around the same time. Medication was prescribed started by Dr. Holley Raring, nephrologist. Is a little bit more constipated than usual  She has also been on Ozempic for over a year, last dose change was up to '1mg'$  weekly starting at then end of April  Medications: Outpatient Medications Prior to Visit  Medication Sig   allopurinol (ZYLOPRIM) 100 MG tablet TAKE 1 TABLET BY MOUTH EVERY DAY (Patient taking differently: Take 100 mg by mouth daily.)   Calcium Carbonate Antacid (TUMS PO) Take 2 tablets by mouth as needed.   clonazePAM (KLONOPIN) 0.5 MG tablet Take 1 tablet (0.5 mg total) by mouth 2 (two) times daily as needed for anxiety. (Patient taking differently: Take 0.5 mg by mouth daily as needed for anxiety.)   dicyclomine (BENTYL) 10 MG capsule TAKE 1 CAPSULE (10 MG TOTAL) BY MOUTH 4 TIMES A DAY BEFORE MEALS AND AT BEDTIME (Patient taking differently: Take 10 mg by mouth in the  morning and at bedtime.)   FARXIGA 10 MG TABS tablet Take 10 mg by mouth daily.   FIBER PO Take 1 tablet by mouth daily.   losartan (COZAAR) 25 MG tablet Take 25 mg by mouth at bedtime.   lovastatin (MEVACOR) 20 MG tablet TAKE 1 TABLET BY MOUTH EVERY DAY (Patient taking differently: Take 20 mg by mouth daily.)   metFORMIN (GLUCOPHAGE) 500 MG tablet TAKE 1 TABLET BY MOUTH EVERY EVENING. (Patient taking differently: Take 500 mg by mouth at bedtime.)   ondansetron (ZOFRAN) 4 MG tablet Take 1 tablet (4 mg total) by mouth every 8 (eight) hours as needed for nausea or vomiting. (Patient taking differently: Take 4 mg by mouth daily as needed for nausea or vomiting.)   QUEtiapine (SEROQUEL) 200 MG tablet Take 1 tablet (200 mg total) by mouth 2 (two) times daily.   Semaglutide, 1 MG/DOSE, 4 MG/3ML SOPN Inject 1 mg as directed once a week.   sodium bicarbonate 650 MG tablet TAKE 2 TABLETS (1,300 MG TOTAL) BY MOUTH IN THE MORNING AND AT BEDTIME. (Patient taking differently: Take 1,300 mg by mouth 2 (two) times daily.)   spironolactone (ALDACTONE) 50 MG tablet TAKE 1 TABLET BY MOUTH TWICE A DAY (Patient taking differently: Take 50 mg by mouth daily.)   No facility-administered medications prior to visit.    Review of Systems  Constitutional:  Positive for appetite change, unexpected weight change and weight loss. Negative for chills and fever.  Respiratory:  Negative for cough and  shortness of breath.   Cardiovascular:  Negative for chest pain.  Gastrointestinal:  Positive for abdominal pain, nausea and vomiting. Negative for diarrhea.  Musculoskeletal:  Negative for arthralgias and myalgias.  Neurological:  Negative for dizziness and headaches.       Objective    BP 102/70 (BP Location: Left Arm, Patient Position: Sitting, Cuff Size: Large)   Pulse 98   Temp 97.6 F (36.4 C) (Oral)   Resp 16   Wt 210 lb 6.4 oz (95.4 kg)   BMI 37.27 kg/m    Physical Exam    General: Appearance:    Obese  female in no acute distress  Eyes:    PERRL, conjunctiva/corneas clear, EOM's intact       Lungs:     Clear to auscultation bilaterally, respirations unlabored  Heart:    Normal heart rate. Normal rhythm. No murmurs, rubs, or gallops.    GI:   Slightly hypoactive bowel sounds.     Neurologic:   Awake, alert, oriented x 3. No apparent focal neurological defect.         Assessment & Plan     1. Nausea and vomiting, unspecified vomiting type She reports sx started soon after starting Farxiga. Advised this would not be typical side effect unless she develop lactic acidoses.  - CBC - Comprehensive metabolic panel - Lipase  May be related to semaglutide for which dose was increased 3-4 months ago. Her last dose of semaglutide was yesterday. Consider holding medication if labs are unremarkable.   2. Type 2 diabetes mellitus with stage 3b chronic kidney disease, without long-term current use of insulin (HCC)  - Hemoglobin A1c     The entirety of the information documented in the History of Present Illness, Review of Systems and Physical Exam were personally obtained by me. Portions of this information were initially documented by the CMA and reviewed by me for thoroughness and accuracy.     Lelon Huh, MD  [facility] [phone] (phone) [phone] (fax)  [place]

## 2021-11-19 LAB — CBC
Hematocrit: 42.7 % (ref 34.0–46.6)
Hemoglobin: 14.4 g/dL (ref 11.1–15.9)
MCH: 31.2 pg (ref 26.6–33.0)
MCHC: 33.7 g/dL (ref 31.5–35.7)
MCV: 93 fL (ref 79–97)
Platelets: 224 10*3/uL (ref 150–450)
RBC: 4.61 x10E6/uL (ref 3.77–5.28)
RDW: 13.1 % (ref 11.7–15.4)
WBC: 7.3 10*3/uL (ref 3.4–10.8)

## 2021-11-19 LAB — HEMOGLOBIN A1C
Est. average glucose Bld gHb Est-mCnc: 151 mg/dL
Hgb A1c MFr Bld: 6.9 % — ABNORMAL HIGH (ref 4.8–5.6)

## 2021-11-19 LAB — COMPREHENSIVE METABOLIC PANEL
ALT: 19 IU/L (ref 0–32)
AST: 27 IU/L (ref 0–40)
Albumin/Globulin Ratio: 1.6 (ref 1.2–2.2)
Albumin: 4.6 g/dL (ref 3.8–4.9)
Alkaline Phosphatase: 73 IU/L (ref 44–121)
BUN/Creatinine Ratio: 5 — ABNORMAL LOW (ref 9–23)
BUN: 9 mg/dL (ref 6–24)
Bilirubin Total: 0.4 mg/dL (ref 0.0–1.2)
CO2: 21 mmol/L (ref 20–29)
Calcium: 9.7 mg/dL (ref 8.7–10.2)
Chloride: 101 mmol/L (ref 96–106)
Creatinine, Ser: 1.71 mg/dL — ABNORMAL HIGH (ref 0.57–1.00)
Globulin, Total: 2.9 g/dL (ref 1.5–4.5)
Glucose: 150 mg/dL — ABNORMAL HIGH (ref 70–99)
Potassium: 4.8 mmol/L (ref 3.5–5.2)
Sodium: 136 mmol/L (ref 134–144)
Total Protein: 7.5 g/dL (ref 6.0–8.5)
eGFR: 35 mL/min/{1.73_m2} — ABNORMAL LOW (ref >59)

## 2021-11-19 LAB — LIPASE: Lipase: 53 U/L (ref 14–72)

## 2021-11-23 ENCOUNTER — Encounter: Payer: Self-pay | Admitting: Family Medicine

## 2021-12-17 NOTE — Progress Notes (Signed)
I,Roshena L Chambers,acting as a scribe for Lelon Huh, MD.,have documented all relevant documentation on the behalf of Lelon Huh, MD,as directed by  Lelon Huh, MD while in the presence of Lelon Huh, MD.   Established patient visit   Patient: Kim Obrien   DOB: 05-17-1965   56 y.o. Female  MRN: [id number] Visit Date: 12/18/2021  Today's healthcare provider: Lelon Huh, MD   Chief Complaint  Patient presents with   Diabetes   Subjective    HPI  Diabetes Mellitus Type II, Follow-up  Lab Results  Component Value Date   HGBA1C 6.9 (H) 11/18/2021   HGBA1C 7.0 (H) 08/03/2021   HGBA1C 5.6 03/31/2021   Wt Readings from Last 3 Encounters:  12/18/21 209 lb (94.8 kg)  11/18/21 210 lb 6.4 oz (95.4 kg)  10/02/21 216 lb (98 kg)   Last seen for diabetes on 11/18/2021.   Management since then includes advising patient to skip Ozempic for the next two weeks due to having symptoms of nausea and vomiting.  However, she states that nausea and vomiting resolved after that visit, so she continued taking 47m Ozempic. Had a few days of nausea since then. She thought it may have been a side effect of Farxiga prescribed by Dr. LHolley Raring but he told it was not and referred her to GI. She has an appointment with GI on 02/08/2022. She has continued to take FIransince then.   Symptoms: No fatigue No foot ulcerations  No appetite changes No nausea  No paresthesia of the feet  No polydipsia  No polyuria No visual disturbances   No vomiting     Home blood sugar records: fasting range: 120  Episodes of hypoglycemia? No    Current insulin regiment: none Most Recent Eye Exam: 10/28/2021 Current exercise: none Current diet habits: well balanced  Pertinent Labs: Lab Results  Component Value Date   CHOL 166 12/29/2020   HDL 42 12/29/2020   LDLCALC 81 12/29/2020   TRIG 260 (H) 12/29/2020   CHOLHDL 4.0 12/29/2020   Lab Results  Component Value Date   NA 136 11/18/2021    K 4.8 11/18/2021   CREATININE 1.71 (H) 11/18/2021   EGFR 35 (L) 11/18/2021   MICROALBUR 60 08/04/2020   LABMICR 20.2 08/03/2021     ---------------------------------------------------------------------------------------------------   Medications: Outpatient Medications Prior to Visit  Medication Sig   allopurinol (ZYLOPRIM) 100 MG tablet TAKE 1 TABLET BY MOUTH EVERY DAY (Patient taking differently: Take 100 mg by mouth daily.)   Calcium Carbonate Antacid (TUMS PO) Take 2 tablets by mouth as needed.   clonazePAM (KLONOPIN) 0.5 MG tablet Take 1 tablet (0.5 mg total) by mouth 2 (two) times daily as needed for anxiety. (Patient taking differently: Take 0.5 mg by mouth daily as needed for anxiety.)   dicyclomine (BENTYL) 10 MG capsule TAKE 1 CAPSULE (10 MG TOTAL) BY MOUTH 4 TIMES A DAY BEFORE MEALS AND AT BEDTIME (Patient taking differently: Take 10 mg by mouth in the morning and at bedtime.)   FARXIGA 10 MG TABS tablet Take 10 mg by mouth daily.   FIBER PO Take 1 tablet by mouth daily.   losartan (COZAAR) 25 MG tablet Take 25 mg by mouth at bedtime.   lovastatin (MEVACOR) 20 MG tablet TAKE 1 TABLET BY MOUTH EVERY DAY (Patient taking differently: Take 20 mg by mouth daily.)   metFORMIN (GLUCOPHAGE) 500 MG tablet TAKE 1 TABLET BY MOUTH EVERY EVENING. (Patient taking differently: Take 500 mg by  mouth at bedtime.)   ondansetron (ZOFRAN) 4 MG tablet Take 1 tablet (4 mg total) by mouth every 8 (eight) hours as needed for nausea or vomiting. (Patient taking differently: Take 4 mg by mouth daily as needed for nausea or vomiting.)   QUEtiapine (SEROQUEL) 200 MG tablet Take 1 tablet (200 mg total) by mouth 2 (two) times daily.   Semaglutide, 1 MG/DOSE, 4 MG/3ML SOPN Inject 1 mg as directed once a week.   sodium bicarbonate 650 MG tablet TAKE 2 TABLETS (1,300 MG TOTAL) BY MOUTH IN THE MORNING AND AT BEDTIME. (Patient taking differently: Take 1,300 mg by mouth 2 (two) times daily.)   spironolactone  (ALDACTONE) 50 MG tablet TAKE 1 TABLET BY MOUTH TWICE A DAY (Patient taking differently: Take 50 mg by mouth daily.)   No facility-administered medications prior to visit.    Review of Systems  Constitutional:  Negative for appetite change, chills, fatigue and fever.  Respiratory:  Negative for chest tightness and shortness of breath.   Cardiovascular:  Negative for chest pain and palpitations.  Gastrointestinal:  Negative for abdominal pain, nausea and vomiting.  Neurological:  Negative for dizziness and weakness.       Objective    BP 94/67 (BP Location: Left Arm, Patient Position: Sitting, Cuff Size: Large)   Pulse (!) 102   Temp 98.4 F (36.9 C) (Oral)   Resp 14   Wt 209 lb (94.8 kg)   SpO2 97% Comment: room air  BMI 37.02 kg/m    Physical Exam   General appearance: Obese female, cooperative and in no acute distress Head: Normocephalic, without obvious abnormality, atraumatic Respiratory: Respirations even and unlabored, normal respiratory rate Extremities: All extremities are intact.  Skin: Skin color, texture, turgor normal. No rashes seen  Psych: Appropriate mood and affect. Neurologic: Mental status: Alert, oriented to person, place, and time, thought content appropriate.   Assessment & Plan     1. Nausea and vomiting, unspecified vomiting type Resolved since last visit. She initially thought it was side effect of Farxiga, and I thought it was side effect of Ozempic. However she has continued both medications, so I suspect she may have a GI virus, or may have since developed tolerance to medications. In any case she is to let me know if symptoms return, in which case will reduce Ozempic back to .20m otherwise follow up in 2 months to check A1c.   2. Encounter for screening for malignant neoplasm of breast, unspecified screening modality  - MM 3D SCREEN BREAST BILATERAL; Future  3. Type 2 diabetes mellitus with stage 3b chronic kidney disease, without long-term  current use of insulin (HCC) Very well controlled as above.       The entirety of the information documented in the History of Present Illness, Review of Systems and Physical Exam were personally obtained by me. Portions of this information were initially documented by the CMA and reviewed by me for thoroughness and accuracy.     DLelon Huh MD  [phone](phone) [phone](fax)  [place]

## 2021-12-18 ENCOUNTER — Ambulatory Visit (INDEPENDENT_AMBULATORY_CARE_PROVIDER_SITE_OTHER): Payer: Medicaid Other | Admitting: Family Medicine

## 2021-12-18 ENCOUNTER — Encounter: Payer: Self-pay | Admitting: Family Medicine

## 2021-12-18 VITALS — BP 94/67 | HR 102 | Temp 98.4°F | Resp 14 | Wt 209.0 lb

## 2021-12-18 DIAGNOSIS — Z1239 Encounter for other screening for malignant neoplasm of breast: Secondary | ICD-10-CM | POA: Diagnosis not present

## 2021-12-18 DIAGNOSIS — N1832 Chronic kidney disease, stage 3b: Secondary | ICD-10-CM | POA: Diagnosis not present

## 2021-12-18 DIAGNOSIS — R112 Nausea with vomiting, unspecified: Secondary | ICD-10-CM

## 2021-12-18 DIAGNOSIS — E1122 Type 2 diabetes mellitus with diabetic chronic kidney disease: Secondary | ICD-10-CM

## 2021-12-18 NOTE — Patient Instructions (Signed)
.   Please review the attached list of medications and notify my office if there are any errors.   . Please bring all of your medications to every appointment so we can make sure that our medication list is the same as yours.   

## 2022-01-08 ENCOUNTER — Other Ambulatory Visit: Payer: Self-pay | Admitting: Family Medicine

## 2022-01-08 NOTE — Telephone Encounter (Signed)
Requested medication (s) are due for refill today: yes  Requested medication (s) are on the active medication list: yes  Last refill:  12/29/20 #20 3 RF  Future visit scheduled: yes  Notes to clinic:  medication not delegated to NT to Reorder   Requested Prescriptions  Pending Prescriptions Disp Refills   ondansetron (ZOFRAN) 4 MG tablet [Pharmacy Med Name: ONDANSETRON HCL 4 MG TABLET] 20 tablet 3    Sig: TAKE 1 TABLET BY MOUTH EVERY 8 HOURS AS NEEDED FOR NAUSEA AND VOMITING     Not Delegated - Gastroenterology: Antiemetics - ondansetron Failed - 01/08/2022  9:57 AM      Failed - This refill cannot be delegated      Passed - AST in normal range and within 360 days    AST  Date Value Ref Range Status  11/18/2021 27 0 - 40 IU/L Final         Passed - ALT in normal range and within 360 days    ALT  Date Value Ref Range Status  11/18/2021 19 0 - 32 IU/L Final         Passed - Valid encounter within last 6 months    Recent Outpatient Visits           3 weeks ago Nausea and vomiting, unspecified vomiting type   [facility] Birdie Sons, MD   1 month ago Nausea and vomiting, unspecified vomiting type   [facility] Birdie Sons, MD   3 months ago Acute cystitis without hematuria   [facility] Birdie Sons, MD   5 months ago Diabetes mellitus without complication [facility])   [facility] Birdie Sons, MD   9 months ago Type 2 diabetes mellitus with stage 3b chronic kidney disease, without long-term current use of insulin [facility])   [facility] Birdie Sons, MD       Future Appointments             In 1 month Fisher, Kirstie Peri, MD [facility]

## 2022-01-21 ENCOUNTER — Ambulatory Visit
Admission: RE | Admit: 2022-01-21 | Discharge: 2022-01-21 | Disposition: A | Discharge status: Home / Self Care | Payer: Medicaid Other | Source: Ambulatory Visit | Attending: Family Medicine | Admitting: Family Medicine

## 2022-01-21 DIAGNOSIS — Z1239 Encounter for other screening for malignant neoplasm of breast: Secondary | ICD-10-CM | POA: Diagnosis present

## 2022-01-21 DIAGNOSIS — Z1231 Encounter for screening mammogram for malignant neoplasm of breast: Secondary | ICD-10-CM | POA: Insufficient documentation

## 2022-01-22 ENCOUNTER — Inpatient Hospital Stay
Admission: RE | Admit: 2022-01-22 | Discharge: 2022-01-22 | Disposition: A | Discharge status: Home / Self Care | Payer: Self-pay | Source: Ambulatory Visit | Attending: *Deleted | Admitting: *Deleted

## 2022-01-22 ENCOUNTER — Other Ambulatory Visit: Payer: Self-pay | Admitting: *Deleted

## 2022-01-22 DIAGNOSIS — Z1231 Encounter for screening mammogram for malignant neoplasm of breast: Secondary | ICD-10-CM

## 2022-02-08 ENCOUNTER — Other Ambulatory Visit: Payer: Self-pay | Admitting: Gastroenterology

## 2022-02-08 DIAGNOSIS — R1319 Other dysphagia: Secondary | ICD-10-CM

## 2022-02-08 DIAGNOSIS — R1013 Epigastric pain: Secondary | ICD-10-CM

## 2022-02-08 DIAGNOSIS — R112 Nausea with vomiting, unspecified: Secondary | ICD-10-CM

## 2022-02-08 DIAGNOSIS — K219 Gastro-esophageal reflux disease without esophagitis: Secondary | ICD-10-CM

## 2022-02-18 ENCOUNTER — Ambulatory Visit
Admission: RE | Admit: 2022-02-18 | Discharge: 2022-02-18 | Disposition: A | Discharge status: Home / Self Care | Payer: Medicaid Other | Source: Ambulatory Visit | Attending: Gastroenterology | Admitting: Gastroenterology

## 2022-02-18 DIAGNOSIS — R1319 Other dysphagia: Secondary | ICD-10-CM | POA: Insufficient documentation

## 2022-02-18 DIAGNOSIS — K219 Gastro-esophageal reflux disease without esophagitis: Secondary | ICD-10-CM | POA: Insufficient documentation

## 2022-02-18 DIAGNOSIS — R112 Nausea with vomiting, unspecified: Secondary | ICD-10-CM | POA: Insufficient documentation

## 2022-02-18 DIAGNOSIS — R1013 Epigastric pain: Secondary | ICD-10-CM | POA: Insufficient documentation

## 2022-02-22 ENCOUNTER — Other Ambulatory Visit: Payer: Self-pay | Admitting: Family Medicine

## 2022-02-22 DIAGNOSIS — M109 Gout, unspecified: Secondary | ICD-10-CM

## 2022-02-25 NOTE — Progress Notes (Signed)
I,Roshena L Chambers,acting as a scribe for Lelon Huh, MD.,have documented all relevant documentation on the behalf of Lelon Huh, MD,as directed by  Lelon Huh, MD while in the presence of Lelon Huh, MD.   Established patient visit   Patient: Kim Obrien   DOB: Mar 21, 1966   56 y.o. Female  MRN: [id number] Visit Date: 02/26/2022  Today's healthcare provider: Lelon Huh, MD   Chief Complaint  Patient presents with   Diabetes   Subjective    HPI  Diabetes Mellitus Type II, Follow-up  Lab Results  Component Value Date   HGBA1C 7.0 (A) 02/26/2022   HGBA1C 6.9 (H) 11/18/2021   HGBA1C 7.0 (H) 08/03/2021   Wt Readings from Last 3 Encounters:  02/26/22 209 lb (94.8 kg)  12/18/21 209 lb (94.8 kg)  11/18/21 210 lb 6.4 oz (95.4 kg)   Last seen for diabetes 2 months ago.  Management since then includes continuing same medication. She reports good compliance with treatment. She is not having side effects.  Symptoms: No fatigue No foot ulcerations  No appetite changes No nausea  No paresthesia of the feet  No polydipsia  No polyuria No visual disturbances   Yes vomiting     Home blood sugar records: fasting range: 120-130  Episodes of hypoglycemia? No    Current insulin regiment: none Most Recent Eye Exam: 10/28/2021 Current exercise: walking Current diet habits: in general, an "unhealthy" diet  Pertinent Labs: Lab Results  Component Value Date   CHOL 166 12/29/2020   HDL 42 12/29/2020   LDLCALC 81 12/29/2020   TRIG 260 (H) 12/29/2020   CHOLHDL 4.0 12/29/2020   Lab Results  Component Value Date   NA 136 11/18/2021   K 4.8 11/18/2021   CREATININE 1.71 (H) 11/18/2021   EGFR 35 (L) 11/18/2021   MICROALBUR 60 08/04/2020   LABMICR 20.2 08/03/2021     ---------------------------------------------------------------------------------------------------  She is also noted to have had double contrast UGI one on 02-18-2022 finding barium tablet  did not pass lower esophagus and mild intermittent esophageal dysmotility as well as irregular appearance of gastric folds concerning for ulceration. She is schedule for EGD mid January. She does take prn TUMs, but not on PPI or H2 blocker.   Medications: Outpatient Medications Prior to Visit  Medication Sig   allopurinol (ZYLOPRIM) 100 MG tablet Take 1 tablet (100 mg total) by mouth daily.   Calcium Carbonate Antacid (TUMS PO) Take 2 tablets by mouth as needed.   clonazePAM (KLONOPIN) 0.5 MG tablet Take 1 tablet (0.5 mg total) by mouth 2 (two) times daily as needed for anxiety. (Patient taking differently: Take 0.5 mg by mouth daily as needed for anxiety.)   dicyclomine (BENTYL) 10 MG capsule TAKE 1 CAPSULE (10 MG TOTAL) BY MOUTH 4 TIMES A DAY BEFORE MEALS AND AT BEDTIME (Patient taking differently: Take 10 mg by mouth in the morning and at bedtime.)   FARXIGA 10 MG TABS tablet Take 10 mg by mouth daily.   FIBER PO Take 1 tablet by mouth daily.   losartan (COZAAR) 25 MG tablet Take 25 mg by mouth at bedtime.   lovastatin (MEVACOR) 20 MG tablet TAKE 1 TABLET BY MOUTH EVERY DAY (Patient taking differently: Take 20 mg by mouth daily.)   metFORMIN (GLUCOPHAGE) 500 MG tablet TAKE 1 TABLET BY MOUTH EVERY EVENING. (Patient taking differently: Take 500 mg by mouth at bedtime.)   ondansetron (ZOFRAN) 4 MG tablet TAKE 1 TABLET BY MOUTH EVERY 8  HOURS AS NEEDED FOR NAUSEA AND VOMITING   QUEtiapine (SEROQUEL) 200 MG tablet Take 1 tablet (200 mg total) by mouth 2 (two) times daily.   Semaglutide, 1 MG/DOSE, 4 MG/3ML SOPN Inject 1 mg as directed once a week.   sodium bicarbonate 650 MG tablet TAKE 2 TABLETS (1,300 MG TOTAL) BY MOUTH IN THE MORNING AND AT BEDTIME. (Patient taking differently: Take 1,300 mg by mouth 2 (two) times daily.)   spironolactone (ALDACTONE) 50 MG tablet TAKE 1 TABLET BY MOUTH TWICE A DAY (Patient taking differently: Take 50 mg by mouth once.)   No facility-administered medications prior  to visit.    Review of Systems  Constitutional:  Negative for appetite change, chills, fatigue and fever.  Respiratory:  Negative for chest tightness and shortness of breath.   Cardiovascular:  Negative for chest pain and palpitations.  Gastrointestinal:  Positive for nausea and vomiting. Negative for abdominal pain.  Neurological:  Negative for dizziness and weakness.       Objective    BP 109/74 (BP Location: Left Arm, Patient Position: Sitting, Cuff Size: Large)   Pulse 99   Temp 97.8 F (36.6 C) (Oral)   Wt 209 lb (94.8 kg)   SpO2 98% Comment: room air  BMI 37.02 kg/m    Physical Exam  General appearance: Mildly obese female, cooperative and in no acute distress Head: Normocephalic, without obvious abnormality, atraumatic Respiratory: Respirations even and unlabored, normal respiratory rate Extremities: All extremities are intact.  Skin: Skin color, texture, turgor normal. No rashes seen  Psych: Appropriate mood and affect. Neurologic: Mental status: Alert, oriented to person, place, and time, thought content appropriate.   Results for orders placed or performed in visit on 02/26/22  POCT HgB A1C  Result Value Ref Range   Hemoglobin A1C 7.0 (A) 4.0 - 5.6 %   Est. average glucose Bld gHb Est-mCnc 154     Assessment & Plan     1. Type 2 diabetes mellitus with stage 3b chronic kidney disease, without long-term current use of insulin ([place]) Doing well back on Ozempic with just mild nausea for 1-2 days after administering medication.   2. Bipolar disorder, in full remission, most recent episode mixed ([place]) Very well controlled, refill QUEtiapine (SEROQUEL) 200 MG tablet; Take 1 tablet (200 mg total) by mouth 2 (two) times daily.  Dispense: 60 tablet; Refill: 12  3. Acute superficial gastritis without hemorrhage Per findings on recent UGI. Not currently on PPI or H2 blocker.  - H. pylori breath test      The entirety of the information documented in the History of  Present Illness, Review of Systems and Physical Exam were personally obtained by me. Portions of this information were initially documented by the CMA and reviewed by me for thoroughness and accuracy.     Lelon Huh, MD  [facility] [phone] (phone) [phone] (fax)  [place]

## 2022-02-26 ENCOUNTER — Encounter: Payer: Self-pay | Admitting: Family Medicine

## 2022-02-26 ENCOUNTER — Ambulatory Visit (INDEPENDENT_AMBULATORY_CARE_PROVIDER_SITE_OTHER): Payer: Medicaid Other | Admitting: Family Medicine

## 2022-02-26 VITALS — BP 109/74 | HR 99 | Temp 97.8°F | Wt 209.0 lb

## 2022-02-26 DIAGNOSIS — K2900 Acute gastritis without bleeding: Secondary | ICD-10-CM | POA: Diagnosis not present

## 2022-02-26 DIAGNOSIS — E1122 Type 2 diabetes mellitus with diabetic chronic kidney disease: Secondary | ICD-10-CM | POA: Diagnosis not present

## 2022-02-26 DIAGNOSIS — F3178 Bipolar disorder, in full remission, most recent episode mixed: Secondary | ICD-10-CM

## 2022-02-26 DIAGNOSIS — N1832 Chronic kidney disease, stage 3b: Secondary | ICD-10-CM | POA: Diagnosis not present

## 2022-02-26 DIAGNOSIS — K2960 Other gastritis without bleeding: Secondary | ICD-10-CM

## 2022-02-26 LAB — POCT GLYCOSYLATED HEMOGLOBIN (HGB A1C)
Est. average glucose Bld gHb Est-mCnc: 154
Hemoglobin A1C: 7 % — AB (ref 4.0–5.6)

## 2022-02-26 MED ORDER — QUETIAPINE FUMARATE 200 MG PO TABS: 200.0000 mg | ORAL_TABLET | Freq: Two times a day (BID) | ORAL | 12 refills | Status: DC

## 2022-02-28 LAB — H. PYLORI BREATH TEST: H pylori Breath Test: NEGATIVE

## 2022-03-01 MED ORDER — OMEPRAZOLE 20 MG PO CPDR: 20.0000 mg | DELAYED_RELEASE_CAPSULE | Freq: Every day | ORAL | 3 refills | Status: DC

## 2022-03-01 NOTE — Addendum Note (Signed)
Addended by: Smitty Knudsen on: 03/01/2022 01:18 PM   Modules accepted: Orders

## 2022-03-05 ENCOUNTER — Other Ambulatory Visit: Payer: Self-pay | Admitting: Family Medicine

## 2022-03-05 DIAGNOSIS — R030 Elevated blood-pressure reading, without diagnosis of hypertension: Secondary | ICD-10-CM

## 2022-03-07 ENCOUNTER — Other Ambulatory Visit: Payer: Self-pay | Admitting: Family Medicine

## 2022-03-07 DIAGNOSIS — K589 Irritable bowel syndrome without diarrhea: Secondary | ICD-10-CM

## 2022-04-28 ENCOUNTER — Encounter: Payer: Self-pay | Admitting: Gastroenterology

## 2022-04-28 NOTE — H&P (Signed)
Pre-Procedure H&P   Patient ID: Kim Obrien is a 57 y.o. female.  Gastroenterology Provider: Annamaria Helling, DO  Referring Provider: Octavia Bruckner, PA PCP: Kim Sons, MD  Date: 04/29/2022  HPI [name] is a 57 y.o. female who presents today for Esophagogastroduodenoscopy for Nausea, vomiting, epigastric pain, dysphagia .  Patient has had chronic intermittent nausea vomiting and epigastric pain for multiple years.  She feels it has worsened over the last several months.  She reports nonbilious nonbloody vomiting 1 time per week.  Underwent a CT in June which was unremarkable.  She notes occasional reflux.  Has noted suprasternal sticking to solid foods/dysphagia.  Denies any odynophagia.  An upper GI series was performed demonstrating tertiary contractions of the esophagus as well as thickened gastric folds.  A small hiatal hernia as well as reflux was noted.  The barium tablet did not pass.  Patient is currently on Ozempic which she took last Friday.  She has noted early satiety with this.  H. pylori breath testing negative.  Patient has a history of chronic constipation   Past Medical History:  Diagnosis Date   Arthritis    Bipolar 1 disorder ([place])    Diabetes mellitus without complication ([place])    History of chicken pox     Past Surgical History:  Procedure Laterality Date   LAPAROSCOPY Left 11/04/2017   Procedure: operative laparoscopy, aspiration of left ovarian cyst, left salpingo-oopherectomy;  Surgeon: Kim Bonnet, MD;  Location: [facility] ORS;  Service: Gynecology;  Laterality: Left;   pap smear  2008   in [place]   partial wisdom tooth extraction  2004    Family History No h/o GI disease or malignancy  Review of Systems  Constitutional:  Negative for activity change, appetite change, chills, diaphoresis, fatigue, fever and unexpected weight change.  HENT:  Positive for trouble swallowing. Negative for voice change.   Respiratory:   Negative for shortness of breath and wheezing.   Cardiovascular:  Negative for chest pain, palpitations and leg swelling.  Gastrointestinal:  Positive for abdominal pain, nausea and vomiting. Negative for abdominal distention, anal bleeding, blood in stool, constipation, diarrhea and rectal pain.  Musculoskeletal:  Negative for arthralgias and myalgias.  Skin:  Negative for color change and pallor.  Neurological:  Negative for dizziness, syncope and weakness.  Psychiatric/Behavioral:  Negative for confusion.   All other systems reviewed and are negative.    Medications No current facility-administered medications on file prior to encounter.   Current Outpatient Medications on File Prior to Encounter  Medication Sig Dispense Refill   allopurinol (ZYLOPRIM) 100 MG tablet Take 1 tablet (100 mg total) by mouth daily. 30 tablet 12   FARXIGA 10 MG TABS tablet Take 10 mg by mouth daily.     FIBER PO Take 1 tablet by mouth daily.     losartan (COZAAR) 25 MG tablet Take 25 mg by mouth at bedtime.     lovastatin (MEVACOR) 20 MG tablet TAKE 1 TABLET BY MOUTH EVERY DAY (Patient taking differently: Take 20 mg by mouth daily.) 90 tablet 4   metFORMIN (GLUCOPHAGE) 500 MG tablet TAKE 1 TABLET BY MOUTH EVERY EVENING. (Patient taking differently: Take 500 mg by mouth at bedtime.) 90 tablet 4   sodium bicarbonate 650 MG tablet TAKE 2 TABLETS (1,300 MG TOTAL) BY MOUTH IN THE MORNING AND AT BEDTIME. (Patient taking differently: Take 1,300 mg by mouth 2 (two) times daily.) 120 tablet 11   Calcium Carbonate  Antacid (TUMS PO) Take 2 tablets by mouth as needed.     clonazePAM (KLONOPIN) 0.5 MG tablet Take 1 tablet (0.5 mg total) by mouth 2 (two) times daily as needed for anxiety. (Patient taking differently: Take 0.5 mg by mouth daily as needed for anxiety.) 20 tablet 3   ondansetron (ZOFRAN) 4 MG tablet TAKE 1 TABLET BY MOUTH EVERY 8 HOURS AS NEEDED FOR NAUSEA AND VOMITING 20 tablet 3   Semaglutide, 1 MG/DOSE, 4  MG/3ML SOPN Inject 1 mg as directed once a week. 3 mL 5    Pertinent medications related to GI and procedure were reviewed by me with the patient prior to the procedure   Current Facility-Administered Medications:    0.9 %  sodium chloride infusion, , Intravenous, Continuous, Kim Helling, DO, Last Rate: 20 mL/hr at 04/29/22 0726, 1,000 mL at 04/29/22 0726      No Known Allergies Allergies were reviewed by me prior to the procedure  Objective   Body mass index is 36.74 kg/m. Vitals:   04/29/22 0708  BP: 121/82  Pulse: (!) 102  Resp: 18  Temp: (!) 96.6 F (35.9 C)  TempSrc: Temporal  SpO2: 99%  Weight: 94.1 kg  Height: '5\' 3"'$  (1.6 m)     Physical Exam Vitals and nursing note reviewed.  Constitutional:      General: She is not in acute distress.    Appearance: Normal appearance. She is obese. She is not ill-appearing, toxic-appearing or diaphoretic.  HENT:     Head: Normocephalic and atraumatic.     Nose: Nose normal.     Mouth/Throat:     Mouth: Mucous membranes are moist.     Pharynx: Oropharynx is clear.  Eyes:     General: No scleral icterus.    Extraocular Movements: Extraocular movements intact.  Cardiovascular:     Rate and Rhythm: Regular rhythm. Tachycardia present.     Heart sounds: Normal heart sounds. No murmur heard.    No friction rub. No gallop.  Pulmonary:     Effort: Pulmonary effort is normal. No respiratory distress.     Breath sounds: Normal breath sounds. No wheezing, rhonchi or rales.  Abdominal:     General: Bowel sounds are normal. There is no distension.     Palpations: Abdomen is soft.     Tenderness: There is no abdominal tenderness. There is no guarding or rebound.  Musculoskeletal:     Cervical back: Neck supple.     Right lower leg: No edema.     Left lower leg: No edema.  Skin:    General: Skin is warm and dry.     Coloration: Skin is not jaundiced or pale.  Neurological:     General: No focal deficit present.      Mental Status: She is alert and oriented to person, place, and time. Mental status is at baseline.  Psychiatric:        Mood and Affect: Mood normal.        Behavior: Behavior normal.        Thought Content: Thought content normal.        Judgment: Judgment normal.      Assessment:  [name] is a 56 y.o. female  who presents today for Esophagogastroduodenoscopy for Nausea, vomiting, epigastric pain, dysphagia .  Plan:  Esophagogastroduodenoscopy with possible intervention today  Esophagogastroduodenoscopy with possible biopsy, control of bleeding, polypectomy, and interventions as necessary has been discussed with the patient/patient representative. Informed consent  was obtained from the patient/patient representative after explaining the indication, nature, and risks of the procedure including but not limited to death, bleeding, perforation, missed neoplasm/lesions, cardiorespiratory compromise, and reaction to medications. Opportunity for questions was given and appropriate answers were provided. Patient/patient representative has verbalized understanding is amenable to undergoing the procedure.   Kim Helling, DO  [facility]  Portions of the record may have been created with voice recognition software. Occasional wrong-word or 'sound-a-like' substitutions may have occurred due to the inherent limitations of voice recognition software.  Read the chart carefully and recognize, using context, where substitutions may have occurred.

## 2022-04-29 ENCOUNTER — Ambulatory Visit
Admission: RE | Admit: 2022-04-29 | Discharge: 2022-04-29 | Disposition: A | Discharge status: Home / Self Care | Payer: Medicaid Other | Attending: Gastroenterology | Admitting: Gastroenterology

## 2022-04-29 ENCOUNTER — Ambulatory Visit: Payer: Medicaid Other | Admitting: Anesthesiology

## 2022-04-29 ENCOUNTER — Encounter
Admission: RE | Disposition: A | Discharge status: Home / Self Care | Payer: Self-pay | Source: Home / Self Care | Attending: Gastroenterology

## 2022-04-29 ENCOUNTER — Encounter: Payer: Self-pay | Admitting: Gastroenterology

## 2022-04-29 DIAGNOSIS — Z7984 Long term (current) use of oral hypoglycemic drugs: Secondary | ICD-10-CM | POA: Insufficient documentation

## 2022-04-29 DIAGNOSIS — K2970 Gastritis, unspecified, without bleeding: Secondary | ICD-10-CM | POA: Insufficient documentation

## 2022-04-29 DIAGNOSIS — E119 Type 2 diabetes mellitus without complications: Secondary | ICD-10-CM | POA: Insufficient documentation

## 2022-04-29 DIAGNOSIS — K224 Dyskinesia of esophagus: Secondary | ICD-10-CM | POA: Insufficient documentation

## 2022-04-29 DIAGNOSIS — K2980 Duodenitis without bleeding: Secondary | ICD-10-CM | POA: Insufficient documentation

## 2022-04-29 DIAGNOSIS — Z7985 Long-term (current) use of injectable non-insulin antidiabetic drugs: Secondary | ICD-10-CM | POA: Insufficient documentation

## 2022-04-29 DIAGNOSIS — I10 Essential (primary) hypertension: Secondary | ICD-10-CM | POA: Diagnosis not present

## 2022-04-29 DIAGNOSIS — R1310 Dysphagia, unspecified: Secondary | ICD-10-CM | POA: Diagnosis present

## 2022-04-29 DIAGNOSIS — Z8619 Personal history of other infectious and parasitic diseases: Secondary | ICD-10-CM | POA: Diagnosis not present

## 2022-04-29 HISTORY — PX: ESOPHAGOGASTRODUODENOSCOPY: SHX5428

## 2022-04-29 LAB — GLUCOSE, CAPILLARY: Glucose-Capillary: 207 mg/dL — ABNORMAL HIGH (ref 70–99)

## 2022-04-29 SURGERY — EGD (ESOPHAGOGASTRODUODENOSCOPY)
Anesthesia: General

## 2022-04-29 MED ORDER — METOPROLOL TARTRATE 5 MG/5ML IV SOLN
INTRAVENOUS | Status: AC
  Filled 2022-04-29: qty 5

## 2022-04-29 MED ORDER — GLYCOPYRROLATE 0.2 MG/ML IJ SOLN
INTRAMUSCULAR | Status: DC | PRN
  Administered 2022-04-29: .2 mg via INTRAVENOUS

## 2022-04-29 MED ORDER — ONDANSETRON HCL 4 MG/2ML IJ SOLN
INTRAMUSCULAR | Status: AC
  Filled 2022-04-29: qty 2

## 2022-04-29 MED ORDER — PHENYLEPHRINE 80 MCG/ML (10ML) SYRINGE FOR IV PUSH (FOR BLOOD PRESSURE SUPPORT)
PREFILLED_SYRINGE | INTRAVENOUS | Status: AC
  Filled 2022-04-29: qty 10

## 2022-04-29 MED ORDER — ONDANSETRON HCL 4 MG/2ML IJ SOLN
INTRAMUSCULAR | Status: DC | PRN
  Administered 2022-04-29: 4 mg via INTRAVENOUS

## 2022-04-29 MED ORDER — PROPOFOL 1000 MG/100ML IV EMUL
INTRAVENOUS | Status: AC
  Filled 2022-04-29: qty 200

## 2022-04-29 MED ORDER — PROPOFOL 500 MG/50ML IV EMUL
INTRAVENOUS | Status: DC | PRN
  Administered 2022-04-29: 191.286 ug/kg/min via INTRAVENOUS

## 2022-04-29 MED ORDER — DEXMEDETOMIDINE HCL 200 MCG/2ML IV SOLN
INTRAVENOUS | Status: DC | PRN
  Administered 2022-04-29: 12 ug via INTRAVENOUS

## 2022-04-29 MED ORDER — LIDOCAINE HCL (CARDIAC) PF 100 MG/5ML IV SOSY
PREFILLED_SYRINGE | INTRAVENOUS | Status: DC | PRN
  Administered 2022-04-29: 100 mg via INTRAVENOUS

## 2022-04-29 MED ORDER — DEXMEDETOMIDINE HCL IN NACL 80 MCG/20ML IV SOLN
INTRAVENOUS | Status: AC
  Filled 2022-04-29: qty 20

## 2022-04-29 MED ORDER — SODIUM CHLORIDE 0.9 % IV SOLN
INTRAVENOUS | Status: DC
  Administered 2022-04-29: 1000 mL via INTRAVENOUS

## 2022-04-29 MED ORDER — PROPOFOL 10 MG/ML IV BOLUS
INTRAVENOUS | Status: DC | PRN
  Administered 2022-04-29: 100 mg via INTRAVENOUS

## 2022-04-29 MED ORDER — PROPOFOL 1000 MG/100ML IV EMUL
INTRAVENOUS | Status: AC
  Filled 2022-04-29: qty 100

## 2022-04-29 NOTE — Transfer of Care (Signed)
Immediate Anesthesia Transfer of Care Note  Patient: Kim Obrien  Procedure(s) Performed: ESOPHAGOGASTRODUODENOSCOPY (EGD)  Patient Location: Endoscopy Unit  Anesthesia Type:General  Level of Consciousness: drowsy  Airway & Oxygen Therapy: Patient Spontanous Breathing  Post-op Assessment: Report given to RN and Post -op Vital signs reviewed and stable  Post vital signs: Reviewed and stable  Last Vitals:  Vitals Value Taken Time  BP 116/79 04/29/22 0757  Temp    Pulse 95 04/29/22 0757  Resp    SpO2 94 % 04/29/22 0757    Last Pain:  Vitals:   04/29/22 0757  TempSrc:   PainSc: Asleep         Complications: No notable events documented.

## 2022-04-29 NOTE — Anesthesia Postprocedure Evaluation (Signed)
Anesthesia Post Note  Patient: Kim Obrien  Procedure(s) Performed: ESOPHAGOGASTRODUODENOSCOPY (EGD)  Patient location during evaluation: PACU Anesthesia Type: General Level of consciousness: awake Pain management: pain level controlled Vital Signs Assessment: post-procedure vital signs reviewed and stable Respiratory status: spontaneous breathing and nonlabored ventilation Cardiovascular status: stable Anesthetic complications: no  No notable events documented.   Last Vitals:  Vitals:   04/29/22 0817 04/29/22 0827  BP: 113/88 116/82  Pulse: 93 90  Resp: 18 18  Temp:    SpO2: 95% 96%    Last Pain:  Vitals:   04/29/22 0827  TempSrc:   PainSc: 0-No pain                 VAN STAVEREN,Jora Galluzzo

## 2022-04-29 NOTE — Anesthesia Preprocedure Evaluation (Signed)
Anesthesia Evaluation  Patient identified by MRN, date of birth, ID band Patient awake    Reviewed: Allergy & Precautions, NPO status , Patient's Chart, lab work & pertinent test results  Airway Mallampati: III  TM Distance: >3 FB Neck ROM: full    Dental  (+) Upper Dentures   Pulmonary neg pulmonary ROS   Pulmonary exam normal breath sounds clear to auscultation       Cardiovascular Exercise Tolerance: Good hypertension, negative cardio ROS Normal cardiovascular exam Rhythm:Regular     Neuro/Psych     Bipolar Disorder   negative neurological ROS  negative psych ROS   GI/Hepatic negative GI ROS, Neg liver ROS,,,  Endo/Other  negative endocrine ROSdiabetes, Well Controlled, Type 2, Oral Hypoglycemic Agents    Renal/GU negative Renal ROS  negative genitourinary   Musculoskeletal  (+) Arthritis ,    Abdominal  (+) + obese  Peds negative pediatric ROS (+)  Hematology negative hematology ROS (+)   Anesthesia Other Findings Past Medical History: No date: Arthritis No date: Bipolar 1 disorder ([facility]) No date: Diabetes mellitus without complication ([facility]) No date: History of chicken pox  Past Surgical History: 11/04/2017: LAPAROSCOPY; Left     Comment:  Procedure: operative laparoscopy, aspiration of left               ovarian cyst, left salpingo-oopherectomy;  Surgeon:               Will Bonnet, MD;  Location: [facility] ORS;  Service:               Gynecology;  Laterality: Left; 2008: pap smear     Comment:  in [place]: partial wisdom tooth extraction     Reproductive/Obstetrics negative OB ROS                             Anesthesia Physical Anesthesia Plan  ASA: 3  Anesthesia Plan: General   Post-op Pain Management:    Induction: Intravenous  PONV Risk Score and Plan: Propofol infusion and TIVA  Airway Management Planned: Natural Airway  Additional Equipment:    Intra-op Plan:   Post-operative Plan:   Informed Consent: I have reviewed the patients History and Physical, chart, labs and discussed the procedure including the risks, benefits and alternatives for the proposed anesthesia with the patient or authorized representative who has indicated his/her understanding and acceptance.     Dental Advisory Given  Plan Discussed with: CRNA and Surgeon  Anesthesia Plan Comments:        Anesthesia Quick Evaluation

## 2022-04-29 NOTE — Interval H&P Note (Signed)
History and Physical Interval Note: Preprocedure H&P from 04/29/22  was reviewed and there was no interval change after seeing and examining the patient.  Written consent was obtained from the patient after discussion of risks, benefits, and alternatives. Patient has consented to proceed with Esophagogastroduodenoscopy with possible intervention   04/29/2022 8:15 AM  Kim Obrien  has presented today for surgery, with the diagnosis of Nausea and vomiting, unspecified vomiting type (R11.2) Esophageal dysphagia (R13.19) Gastroesophageal reflux disease, unspecified whether esophagitis present (K21.9) Abdominal pain, epigastric (R10.13).  The various methods of treatment have been discussed with the patient and family. After consideration of risks, benefits and other options for treatment, the patient has consented to  Procedure(s): ESOPHAGOGASTRODUODENOSCOPY (EGD) (N/A) as a surgical intervention.  The patient's history has been reviewed, patient examined, no change in status, stable for surgery.  I have reviewed the patient's chart and labs.  Questions were answered to the patient's satisfaction.     Annamaria Helling

## 2022-04-29 NOTE — Op Note (Signed)
[facility] Gastroenterology Patient Name: Kim Obrien Procedure Date: 04/29/2022 7:20 AM MRN: [id number] Account #: [id number] Date of Birth: Oct 20, 1965 Admit Type: Outpatient Age: 58 Room: [facility] ENDO ROOM 1 Gender: Female Note Status: Finalized Instrument Name: Michaelle Birks [id number] Procedure:             Upper GI endoscopy Indications:           Dysphagia, Heartburn Providers:             Rueben Bash, DO Referring MD:          Kirstie Peri. Caryn Section, MD (Referring MD) Medicines:             Monitored Anesthesia Care Complications:         No immediate complications. Estimated blood loss:                         Minimal. Procedure:             Pre-Anesthesia Assessment:                        - Prior to the procedure, a History and Physical was                         performed, and patient medications and allergies were                         reviewed. The patient is competent. The risks and                         benefits of the procedure and the sedation options and                         risks were discussed with the patient. All questions                         were answered and informed consent was obtained.                         Patient identification and proposed procedure were                         verified by the physician, the nurse, the anesthetist                         and the technician in the endoscopy suite. Mental                         Status Examination: alert and oriented. Airway                         Examination: normal oropharyngeal airway and neck                         mobility. Respiratory Examination: clear to                         auscultation. CV Examination: RRR, no murmurs, no S3  or S4. Prophylactic Antibiotics: The patient does not                         require prophylactic antibiotics. Prior                         Anticoagulants: The patient has taken no anticoagulant                          or antiplatelet agents. ASA Grade Assessment: II - A                         patient with mild systemic disease. After reviewing                         the risks and benefits, the patient was deemed in                         satisfactory condition to undergo the procedure. The                         anesthesia plan was to use monitored anesthesia care                         (MAC). Immediately prior to administration of                         medications, the patient was re-assessed for adequacy                         to receive sedatives. The heart rate, respiratory                         rate, oxygen saturations, blood pressure, adequacy of                         pulmonary ventilation, and response to care were                         monitored throughout the procedure. The physical                         status of the patient was re-assessed after the                         procedure.                        After obtaining informed consent, the endoscope was                         passed under direct vision. Throughout the procedure,                         the patient's blood pressure, pulse, and oxygen                         saturations were monitored continuously. The Endoscope  was introduced through the mouth, and advanced to the                         second part of duodenum. The upper GI endoscopy was                         accomplished without difficulty. The patient tolerated                         the procedure well. Findings:      Food (residue) was found in the second portion of the duodenum.       Estimated blood loss: none.      The duodenal bulb, first portion of the duodenum and second portion of       the duodenum were normal. Biopsies for histology were taken with a cold       forceps for evaluation of celiac disease. Estimated blood loss was       minimal.      A medium amount of food (residue) was found on the greater  curvature of       the stomach and in the gastric antrum. Estimated blood loss: none.      Localized mild inflammation characterized by erythema and granularity       was found in the gastric antrum. Biopsies were taken with a cold forceps       for Helicobacter pylori testing. Estimated blood loss was minimal.      Duodenal and gastric mucosa visualiztion were limited due to food       residue in the stomach and small bowel. Estimated blood loss: none.      Esophagogastric landmarks were identified: the gastroesophageal junction       was found at 40 cm from the incisors.      The Z-line was regular. Estimated blood loss: none.      Abnormal motility was noted in the esophagus. The cricopharyngeus was       normal. There is spasticity of the esophageal body. The distal       esophagus/lower esophageal sphincter is open. Tertiary peristaltic waves       are noted. No dilation performed as esophagus was widely patent and with       food retained in the stomach. Estimated blood loss: none. Impression:            - Retained food in the duodenum.                        - Normal duodenal bulb, first portion of the duodenum                         and second portion of the duodenum. Biopsied.                        - A medium amount of food (residue) in the stomach.                        - Gastritis. Biopsied.                        - Esophagogastric landmarks identified.                        -  Z-line regular.                        - Abnormal esophageal motility, consistent with                         presbyesophagus. Recommendation:        - Patient has a contact number available for                         emergencies. The signs and symptoms of potential                         delayed complications were discussed with the patient.                         Return to normal activities tomorrow. Written                         discharge instructions were provided to the patient.                         - Discharge patient to home.                        - Resume previous diet; moisten and chew food well.                         small bites.                        - Continue present medications.                        - No aspirin, ibuprofen, naproxen, or other                         non-steroidal anti-inflammatory drugs.                        - Await pathology results.                        - Consider ozempic discontinuation. Avoid motility                         slowing agents.                        - Return to GI clinic as previously scheduled.                        - The findings and recommendations were discussed with                         the patient. Procedure Code(s):     --- Professional ---                        [phone], Esophagogastroduodenoscopy, flexible,                         transoral; with biopsy, single or  multiple Diagnosis Code(s):     --- Professional ---                        K29.70, Gastritis, unspecified, without bleeding                        K22.4, Dyskinesia of esophagus                        R13.10, Dysphagia, unspecified                        R12, Heartburn CPT copyright 2022 American Medical Association. All rights reserved. The codes documented in this report are preliminary and upon coder review may  be revised to meet current compliance requirements. Attending Participation:      I personally performed the entire procedure. Volney American, DO Annamaria Helling DO, DO 04/29/2022 8:01:54 AM This report has been signed electronically. Number of Addenda: 0 Note Initiated On: 04/29/2022 7:20 AM Estimated Blood Loss:  Estimated blood loss was minimal.      [facility]

## 2022-04-30 ENCOUNTER — Encounter: Payer: Self-pay | Admitting: Gastroenterology

## 2022-05-04 LAB — SURGICAL PATHOLOGY

## 2022-05-12 ENCOUNTER — Other Ambulatory Visit: Payer: Self-pay | Admitting: Family Medicine

## 2022-05-12 DIAGNOSIS — E119 Type 2 diabetes mellitus without complications: Secondary | ICD-10-CM

## 2022-05-13 NOTE — Telephone Encounter (Signed)
Requested Prescriptions  Pending Prescriptions Disp Refills   OZEMPIC, 1 MG/DOSE, 4 MG/3ML SOPN [Pharmacy Med Name: OZEMPIC 4 MG/3 ML (1 MG/DOSE)] 6 mL 0    Sig: INJECT 1 MG ONCE A WEEK AS DIRECTED     Endocrinology:  Diabetes - GLP-1 Receptor Agonists - semaglutide Failed - 05/12/2022  8:53 PM      Failed - HBA1C in normal range and within 180 days    Hemoglobin A1C  Date Value Ref Range Status  02/26/2022 7.0 (A) 4.0 - 5.6 % Final  08/13/2019 9.9  Final   Hgb A1c MFr Bld  Date Value Ref Range Status  11/18/2021 6.9 (H) 4.8 - 5.6 % Final    Comment:             Prediabetes: 5.7 - 6.4          Diabetes: >6.4          Glycemic control for adults with diabetes: <7.0          Failed - Cr in normal range and within 360 days    Creatinine, Ser  Date Value Ref Range Status  11/18/2021 1.71 (H) 0.57 - 1.00 mg/dL Final         Passed - Valid encounter within last 6 months    Recent Outpatient Visits           2 months ago Type 2 diabetes mellitus with stage 3b chronic kidney disease, without long-term current use of insulin ([place])   [place], Donald E, MD   4 months ago Nausea and vomiting, unspecified vomiting type   [facility] Birdie Sons, MD   5 months ago Nausea and vomiting, unspecified vomiting type   [facility] Birdie Sons, MD   7 months ago Acute cystitis without hematuria   [place], Donald E, MD   9 months ago Diabetes mellitus without complication [facility])   [place], Donald E, MD       Future Appointments             In 2 weeks Fisher, Kirstie Peri, MD [facility], [facility]

## 2022-05-31 ENCOUNTER — Ambulatory Visit (INDEPENDENT_AMBULATORY_CARE_PROVIDER_SITE_OTHER): Payer: Medicaid Other | Admitting: Family Medicine

## 2022-05-31 ENCOUNTER — Ambulatory Visit
Admission: RE | Admit: 2022-05-31 | Discharge: 2022-05-31 | Disposition: A | Discharge status: Home / Self Care | Payer: Medicaid Other | Attending: Family Medicine | Admitting: Family Medicine

## 2022-05-31 ENCOUNTER — Encounter: Payer: Self-pay | Admitting: Family Medicine

## 2022-05-31 ENCOUNTER — Ambulatory Visit
Admission: RE | Admit: 2022-05-31 | Discharge: 2022-05-31 | Disposition: A | Discharge status: Home / Self Care | Payer: Medicaid Other | Source: Ambulatory Visit | Attending: Family Medicine | Admitting: Family Medicine

## 2022-05-31 VITALS — BP 108/70 | HR 68 | Ht 63.0 in | Wt 211.0 lb

## 2022-05-31 DIAGNOSIS — M5416 Radiculopathy, lumbar region: Secondary | ICD-10-CM | POA: Diagnosis not present

## 2022-05-31 DIAGNOSIS — N1832 Chronic kidney disease, stage 3b: Secondary | ICD-10-CM

## 2022-05-31 DIAGNOSIS — E1122 Type 2 diabetes mellitus with diabetic chronic kidney disease: Secondary | ICD-10-CM | POA: Diagnosis not present

## 2022-05-31 LAB — POCT GLYCOSYLATED HEMOGLOBIN (HGB A1C)
Est. average glucose Bld gHb Est-mCnc: 177
Hemoglobin A1C: 7.8 % — AB (ref 4.0–5.6)

## 2022-05-31 MED ORDER — GLIPIZIDE ER 2.5 MG PO TB24: 2.5000 mg | ORAL_TABLET | Freq: Every day | ORAL | 1 refills | Status: DC

## 2022-05-31 NOTE — Progress Notes (Signed)
Established patient visit   Patient: Kim Obrien   DOB: 1965-08-23   57 y.o. Female  MRN: [id number] Visit Date: 05/31/2022  Today's healthcare provider: Lelon Huh, MD   No chief complaint on file.  Subjective    HPI   Diabetes Mellitus Type II, follow-up  Lab Results  Component Value Date   HGBA1C 7.0 (A) 02/26/2022   HGBA1C 6.9 (H) 11/18/2021   HGBA1C 7.0 (H) 08/03/2021   Last seen for diabetes 3 months ago.  Management since then includes continuing the same treatment.  Since then she has had EGD and upper GI and diagnosed with gastroparesis. She was advised to stop Ozempic about 10 days ago and sugars have since been running over 200.   Most Recent Eye Exam: 10/22/21  --------------------------------------------------------------------------------------------------- Hypertension, follow-up  BP Readings from Last 3 Encounters:  04/29/22 116/82  02/26/22 109/74  12/18/21 94/67   Wt Readings from Last 3 Encounters:  04/29/22 207 lb 6.9 oz (94.1 kg)  02/26/22 209 lb (94.8 kg)  12/18/21 209 lb (94.8 kg)     She was last seen for hypertension 9 months ago.  BP at that visit was 96/68. Management since that visit includes continuing the same treatment.  Outside blood pressures are not being checked. Marland Kitchen --------------------------------------------------------------------------------------------------- Lipid/Cholesterol, follow-up  Last Lipid Panel: Lab Results  Component Value Date   CHOL 166 12/29/2020   LDLCALC 81 12/29/2020   HDL 42 12/29/2020   TRIG 260 (H) 12/29/2020    She was last seen for this 17 months ago.  Management since that visit includes continuing the same treatment. Last lipid panel [id number]   Last metabolic panel Lab Results  Component Value Date   GLUCOSE 150 (H) 11/18/2021   NA 136 11/18/2021   K 4.8 11/18/2021   BUN 9 11/18/2021   CREATININE 1.71 (H) 11/18/2021   EGFR 35 (L) 11/18/2021   GFRNONAA 29 (L) 09/25/2021    CALCIUM 9.7 11/18/2021   AST 27 11/18/2021   ALT 19 11/18/2021   The 10-year ASCVD risk score (Arnett DK, et al., 2019) is: 4.4%  ---------------------------------------------------------------------------------------------------   Medications: Outpatient Medications Prior to Visit  Medication Sig   allopurinol (ZYLOPRIM) 100 MG tablet Take 1 tablet (100 mg total) by mouth daily.   Calcium Carbonate Antacid (TUMS PO) Take 2 tablets by mouth as needed.   clonazePAM (KLONOPIN) 0.5 MG tablet Take 1 tablet (0.5 mg total) by mouth 2 (two) times daily as needed for anxiety. (Patient taking differently: Take 0.5 mg by mouth daily as needed for anxiety.)   dicyclomine (BENTYL) 10 MG capsule TAKE 1 CAPSULE (10 MG TOTAL) BY MOUTH 4 TIMES A DAY BEFORE MEALS AND AT BEDTIME   FARXIGA 10 MG TABS tablet Take 10 mg by mouth daily.   FIBER PO Take 1 tablet by mouth daily.   losartan (COZAAR) 25 MG tablet Take 25 mg by mouth at bedtime.   lovastatin (MEVACOR) 20 MG tablet TAKE 1 TABLET BY MOUTH EVERY DAY (Patient taking differently: Take 20 mg by mouth daily.)   metFORMIN (GLUCOPHAGE) 500 MG tablet TAKE 1 TABLET BY MOUTH EVERY EVENING. (Patient taking differently: Take 500 mg by mouth at bedtime.)   omeprazole (PRILOSEC) 20 MG capsule Take 1 capsule (20 mg total) by mouth daily.   ondansetron (ZOFRAN) 4 MG tablet TAKE 1 TABLET BY MOUTH EVERY 8 HOURS AS NEEDED FOR NAUSEA AND VOMITING   QUEtiapine (SEROQUEL) 200 MG tablet Take 1 tablet (200 mg total)  by mouth 2 (two) times daily.   sodium bicarbonate 650 MG tablet TAKE 2 TABLETS (1,300 MG TOTAL) BY MOUTH IN THE MORNING AND AT BEDTIME. (Patient taking differently: Take 1,300 mg by mouth 2 (two) times daily.)   spironolactone (ALDACTONE) 50 MG tablet Take 1 tablet (50 mg total) by mouth 2 (two) times daily.   No facility-administered medications prior to visit.    Review of Systems  Constitutional:  Negative for appetite change, chills, fatigue and  fever.  Respiratory:  Negative for chest tightness and shortness of breath.   Cardiovascular:  Negative for chest pain and palpitations.  Gastrointestinal:  Negative for abdominal pain, nausea and vomiting.  Neurological:  Negative for dizziness and weakness.       Objective    BP 108/70 (BP Location: Right Arm, Patient Position: Sitting, Cuff Size: Large)   Pulse 68   Ht 5' 3"$  (1.6 m)   Wt 211 lb (95.7 kg)   SpO2 95%   BMI 37.38 kg/m    Physical Exam   General appearance: Obese female, cooperative and in no acute distress Head: Normocephalic, without obvious abnormality, atraumatic Respiratory: Respirations even and unlabored, normal respiratory rate Extremities: All extremities are intact.  Skin: Skin color, texture, turgor normal. No rashes seen  Psych: Appropriate mood and affect. Neurologic: Mental status: Alert, oriented to person, place, and time, thought content appropriate.   Results for orders placed or performed in visit on 05/31/22  POCT HgB A1C  Result Value Ref Range   Hemoglobin A1C 7.8 (A) 4.0 - 5.6 %   Est. average glucose Bld gHb Est-mCnc 177     Assessment & Plan     1. Type 2 diabetes mellitus with stage 3b chronic kidney disease, without long-term current use of insulin ([place]) Off Ozempic for 10 days due to gastroparesis. Start back on glipiZIDE (GLUCOTROL XL) 2.5 MG 24 hr tablet; Take 1 tablet (2.5 mg total) by mouth daily with breakfast.  Dispense: 90 tablet; Refill: 1  Future Appointments  Date Time Provider [place]  05/31/2022  4:35 PM [facility]  07/12/2022  1:00 PM Jaelie Aguilera, Kirstie Peri, MD [facility]     2. Lumbar radiculopathy She also complains of pain shooting down left side of hip and down lateral left leg into left side of foot - DG Lumbar Spine Complete; Future         Lelon Huh, MD  [place] [phone] (phone) [phone] (fax)  [place]

## 2022-05-31 NOTE — Patient Instructions (Signed)
.   Please review the attached list of medications and notify my office if there are any errors.   . Please bring all of your medications to every appointment so we can make sure that our medication list is the same as yours.   

## 2022-06-08 ENCOUNTER — Other Ambulatory Visit: Payer: Self-pay

## 2022-06-08 ENCOUNTER — Encounter: Payer: Self-pay | Admitting: Family Medicine

## 2022-06-08 ENCOUNTER — Emergency Department (HOSPITAL_COMMUNITY)
Admission: EM | Admit: 2022-06-08 | Discharge: 2022-06-08 | Disposition: A | Discharge status: Home / Self Care | Payer: Medicaid Other | Attending: Emergency Medicine | Admitting: Emergency Medicine

## 2022-06-08 ENCOUNTER — Ambulatory Visit: Payer: Self-pay

## 2022-06-08 DIAGNOSIS — E1165 Type 2 diabetes mellitus with hyperglycemia: Secondary | ICD-10-CM | POA: Insufficient documentation

## 2022-06-08 DIAGNOSIS — Z7984 Long term (current) use of oral hypoglycemic drugs: Secondary | ICD-10-CM | POA: Insufficient documentation

## 2022-06-08 DIAGNOSIS — Z794 Long term (current) use of insulin: Secondary | ICD-10-CM | POA: Diagnosis not present

## 2022-06-08 DIAGNOSIS — Z79899 Other long term (current) drug therapy: Secondary | ICD-10-CM | POA: Insufficient documentation

## 2022-06-08 DIAGNOSIS — I10 Essential (primary) hypertension: Secondary | ICD-10-CM | POA: Insufficient documentation

## 2022-06-08 DIAGNOSIS — R739 Hyperglycemia, unspecified: Secondary | ICD-10-CM | POA: Diagnosis present

## 2022-06-08 LAB — CBG MONITORING, ED
Glucose-Capillary: 239 mg/dL — ABNORMAL HIGH (ref 70–99)
Glucose-Capillary: 143 mg/dL — ABNORMAL HIGH (ref 70–99)

## 2022-06-08 LAB — BASIC METABOLIC PANEL
Anion gap: 13 (ref 5–15)
BUN: 13 mg/dL (ref 6–20)
CO2: 22 mmol/L (ref 22–32)
Calcium: 9.4 mg/dL (ref 8.9–10.3)
Chloride: 103 mmol/L (ref 98–111)
Creatinine, Ser: 1.61 mg/dL — ABNORMAL HIGH (ref 0.44–1.00)
GFR, Estimated: 38 mL/min — ABNORMAL LOW (ref >60)
Glucose, Bld: 207 mg/dL — ABNORMAL HIGH (ref 70–99)
Potassium: 3.7 mmol/L (ref 3.5–5.1)
Sodium: 138 mmol/L (ref 135–145)

## 2022-06-08 LAB — I-STAT BETA HCG BLOOD, ED (MC, WL, AP ONLY): I-stat hCG, quantitative: 5.7 m[IU]/mL — ABNORMAL HIGH (ref <5)

## 2022-06-08 LAB — CBC
HCT: 42.7 % (ref 36.0–46.0)
Hemoglobin: 14.1 g/dL (ref 12.0–15.0)
MCH: 30.9 pg (ref 26.0–34.0)
MCHC: 33 g/dL (ref 30.0–36.0)
MCV: 93.6 fL (ref 80.0–100.0)
Platelets: 227 10*3/uL (ref 150–400)
RBC: 4.56 MIL/uL (ref 3.87–5.11)
RDW: 13.6 % (ref 11.5–15.5)
WBC: 8.1 10*3/uL (ref 4.0–10.5)
nRBC: 0 % (ref 0.0–0.2)

## 2022-06-08 MED ORDER — LACTATED RINGERS IV BOLUS
1000.0000 mL | Freq: Once | INTRAVENOUS | Status: AC
  Administered 2022-06-08: 1000 mL via INTRAVENOUS

## 2022-06-08 MED ORDER — INSULIN ASPART 100 UNIT/ML IJ SOLN
10.0000 [IU] | Freq: Once | INTRAMUSCULAR | Status: AC
  Administered 2022-06-08: 10 [IU] via INTRAVENOUS

## 2022-06-08 NOTE — ED Provider Triage Note (Signed)
Emergency Medicine Provider Triage Evaluation Note  Kim Obrien , a 57 y.o. female  was evaluated in triage.  Complains of.  Reports her sugars have been in the 200s over the past 2 days.  Patient is on metformin and was on Ozempic until a couple weeks ago.  Doctor did not replace Ozempic with another medication.  Endorses polyuria, polydipsia, nausea and intermittent blurred vision  Review of Systems  Positive:  Negative:  Physical Exam  BP 126/81 (BP Location: Right Arm)   Pulse (!) 107   Temp 98.7 F (37.1 C) (Oral)   Resp 18   SpO2 97%  Gen:   Awake, no distress   Resp:  Normal effort  MSK:   Moves extremities without difficulty  Other:  Resting comfortably, ambulatory and very pleasant  Medical Decision Making  Medically screening exam initiated at 6:53 PM.  Appropriate orders placed.  VALEDA CUNNANE was informed that the remainder of the evaluation will be completed by another provider, this initial triage assessment does not replace that evaluation, and the importance of remaining in the ED until their evaluation is complete.     Rhae Hammock, [place] 06/08/22 [phone]

## 2022-06-08 NOTE — ED Triage Notes (Signed)
Pt reports hyperglycemia after being taken off Ozempic on 2/1 d/t GI issues. Endorses nausea, blurry vision, and polydipsia.

## 2022-06-08 NOTE — Progress Notes (Unsigned)
I,Sulibeya S Dimas,acting as a scribe for Lelon Huh, MD.,have documented all relevant documentation on the behalf of Lelon Huh, MD,as directed by  Lelon Huh, MD while in the presence of Lelon Huh, MD.     Established patient visit   Patient: Kim Obrien   DOB: 12/27/1965   57 y.o. Female  MRN: [id number] Visit Date: 06/09/2022  Today's healthcare provider: Lelon Huh, MD   Chief Complaint  Patient presents with   Diabetes   Subjective    HPI  Follow up ER visit  Patient was seen in ER for hyperglycemia on 06/08/22. She reports that she is on metformin and was on Ozempic up until 3 weeks ago when it was discontinued due to gastroparesis.  At that time she was restarted on glipizide but she reports that the glipizide was never helping her so she was not sure why she was started back on it. She says that her blood sugar had been running around 280 at home. She got up yesterday morning feeling weak, nauseated, having chills and blood sugar up to 264  She was treated for hyperglycemia. Treatment for this included  lactated ringers bolus 1,000 mL (1,000 mLs Intravenous New Bag/Given 06/08/22 2043)  insulin aspart (novoLOG) injection 10 Units (10 Units Intravenous Given 06/08/22 2042)  -----------------------------------------------------------------------------------------  Diabetes Mellitus Type II, Follow-up  Lab Results  Component Value Date   HGBA1C 7.8 (A) 05/31/2022   HGBA1C 7.0 (A) 02/26/2022   HGBA1C 6.9 (H) 11/18/2021   Wt Readings from Last 3 Encounters:  06/09/22 209 lb 11.2 oz (95.1 kg)  06/08/22 213 lb (96.6 kg)  05/31/22 211 lb (95.7 kg)   Last seen for diabetes 1 weeks ago.  Management since then includes off Ozempic. Restart Glipizide 2.5 mg daily. She reports excellent compliance with treatment. She is not having side effects.   Home blood sugar records:  elevated over 200s ,280  211 this morning.   Episodes of hypoglycemia? No     Current insulin regiment: none Most Recent Eye Exam: utd   Pertinent Labs: Lab Results  Component Value Date   CHOL 166 12/29/2020   HDL 42 12/29/2020   LDLCALC 81 12/29/2020   TRIG 260 (H) 12/29/2020   CHOLHDL 4.0 12/29/2020   Lab Results  Component Value Date   NA 138 06/08/2022   K 3.7 06/08/2022   CREATININE 1.61 (H) 06/08/2022   LABMICR 20.2 08/03/2021   MICRALBCREAT 22 08/03/2021     ---------------------------------------------------------------------------------------------------   Medications: Outpatient Medications Prior to Visit  Medication Sig   allopurinol (ZYLOPRIM) 100 MG tablet Take 1 tablet (100 mg total) by mouth daily.   Calcium Carbonate Antacid (TUMS PO) Take 2 tablets by mouth as needed.   clonazePAM (KLONOPIN) 0.5 MG tablet Take 1 tablet (0.5 mg total) by mouth 2 (two) times daily as needed for anxiety. (Patient taking differently: Take 0.5 mg by mouth daily as needed for anxiety.)   dicyclomine (BENTYL) 10 MG capsule TAKE 1 CAPSULE (10 MG TOTAL) BY MOUTH 4 TIMES A DAY BEFORE MEALS AND AT BEDTIME   FARXIGA 10 MG TABS tablet Take 10 mg by mouth daily.   glipiZIDE (GLUCOTROL XL) 2.5 MG 24 hr tablet Take 1 tablet (2.5 mg total) by mouth daily with breakfast.   losartan (COZAAR) 25 MG tablet Take 25 mg by mouth at bedtime.   lovastatin (MEVACOR) 20 MG tablet TAKE 1 TABLET BY MOUTH EVERY DAY (Patient taking differently: Take 20 mg by mouth daily.)  metFORMIN (GLUCOPHAGE) 500 MG tablet TAKE 1 TABLET BY MOUTH EVERY EVENING. (Patient taking differently: Take 500 mg by mouth at bedtime.)   omeprazole (PRILOSEC) 20 MG capsule Take 1 capsule (20 mg total) by mouth daily.   ondansetron (ZOFRAN) 4 MG tablet TAKE 1 TABLET BY MOUTH EVERY 8 HOURS AS NEEDED FOR NAUSEA AND VOMITING   QUEtiapine (SEROQUEL) 200 MG tablet Take 1 tablet (200 mg total) by mouth 2 (two) times daily.   sodium bicarbonate 650 MG tablet TAKE 2 TABLETS (1,300 MG TOTAL) BY MOUTH IN THE MORNING  AND AT BEDTIME. (Patient taking differently: Take 1,300 mg by mouth 2 (two) times daily.)   spironolactone (ALDACTONE) 50 MG tablet Take 1 tablet (50 mg total) by mouth 2 (two) times daily. (Patient taking differently: Take 50 mg by mouth daily.)   [DISCONTINUED] FIBER PO Take 1 tablet by mouth daily. (Patient not taking: Reported on 06/09/2022)   No facility-administered medications prior to visit.    Review of Systems  Constitutional:  Positive for fatigue.  Eyes:  Positive for visual disturbance.  Respiratory:  Negative for cough, chest tightness and shortness of breath.   Cardiovascular:  Negative for leg swelling.  Gastrointestinal:  Negative for abdominal pain, nausea and vomiting.  Endocrine: Positive for polydipsia.  Neurological:  Positive for dizziness, weakness and light-headedness.       Objective    BP 105/73 (BP Location: Left Arm, Patient Position: Sitting, Cuff Size: Large)   Pulse 97   Temp 97.6 F (36.4 C) (Temporal)   Resp 20   Wt 209 lb 11.2 oz (95.1 kg)   LMP  (LMP Unknown) Comment: postmenapausal  SpO2 100%   BMI 37.15 kg/m   Physical Exam    General: Appearance:    Mildly obese female in no acute distress  Eyes:    PERRL, conjunctiva/corneas clear, EOM's intact       Lungs:     Clear to auscultation bilaterally, respirations unlabored  Heart:    Normal heart rate. Normal rhythm. No murmurs, rubs, or gallops.    MS:   All extremities are intact.    Neurologic:   Awake, alert, oriented x 3. No apparent focal neurological defect.         Assessment & Plan     1. Hyperglycemia Feel better today, was given short acting insulin injection in ER last night, no electrolyte abnormalities.   2. Type 2 diabetes mellitus with stage 3b chronic kidney disease, without long-term current use of insulin ([facility]) Home sugars up since having to stop Ozempic due to gastroenteritis. GI symptoms have generally been much better since stopping Ozempic.   Have since added  low dose of glipizide, will continue metformin and add sitaGLIPtin (JANUVIA) 100 MG tablet; Take 1 tablet (100 mg total) by mouth daily.  Dispense: 30 tablet; Refill: 1    3. Bipolar disorder, in full remission, most recent episode mixed (HCC) refill - clonazePAM (KLONOPIN) 0.5 MG tablet; Take 1 tablet (0.5 mg total) by mouth 2 (two) times daily as needed for anxiety.  Dispense: 20 tablet; Refill: 1  Future Appointments  Date Time Provider [place]  07/12/2022  1:00 PM Caryn Section Kirstie Peri, MD [facility]        The entirety of the information documented in the History of Present Illness, Review of Systems and Physical Exam were personally obtained by me. Portions of this information were initially documented by the CMA and reviewed by me for thoroughness and accuracy.  Lelon Huh, MD  [place] [phone] (phone) [phone] (fax)  [place]

## 2022-06-08 NOTE — ED Provider Notes (Signed)
[facility] Provider Note   CSN: [id number] Arrival date & time: 06/08/22  1846     History  Chief Complaint  Patient presents with   Hyperglycemia    Kim Obrien is a 57 y.o. female with a past medical history of hypertension, hyperlipidemia and type 2 diabetes presenting today due to hyperglycemia.  She reports that she is on metformin and was on Ozempic up until 3 weeks ago.  At that time she was restarted on glipizide but she reports that the glipizide was never helping her so she was not sure why she was started back on it.  She says that her blood sugar runs around 280s at home.  Symptoms include polyuria, polydipsia, nausea and intermittent blurred vision.   Reports an appointment with her PCP tomorrow   Hyperglycemia      Home Medications Prior to Admission medications   Medication Sig Start Date End Date Taking? Authorizing Provider  allopurinol (ZYLOPRIM) 100 MG tablet Take 1 tablet (100 mg total) by mouth daily. 02/22/22   Birdie Sons, MD  Calcium Carbonate Antacid (TUMS PO) Take 2 tablets by mouth as needed.    [provider]  clonazePAM (KLONOPIN) 0.5 MG tablet Take 1 tablet (0.5 mg total) by mouth 2 (two) times daily as needed for anxiety. Patient taking differently: Take 0.5 mg by mouth daily as needed for anxiety. 07/20/18   Birdie Sons, MD  dicyclomine (BENTYL) 10 MG capsule TAKE 1 CAPSULE (10 MG TOTAL) BY MOUTH 4 TIMES A DAY BEFORE MEALS AND AT BEDTIME 03/07/22   Birdie Sons, MD  FARXIGA 10 MG TABS tablet Take 10 mg by mouth daily. 09/23/21   [provider]  FIBER PO Take 1 tablet by mouth daily.    [provider]  glipiZIDE (GLUCOTROL XL) 2.5 MG 24 hr tablet Take 1 tablet (2.5 mg total) by mouth daily with breakfast. 05/31/22   Birdie Sons, MD  losartan (COZAAR) 25 MG tablet Take 25 mg by mouth at bedtime. 09/09/21   [provider]  lovastatin (MEVACOR) 20 MG  tablet TAKE 1 TABLET BY MOUTH EVERY DAY Patient taking differently: Take 20 mg by mouth daily. 09/18/21   Birdie Sons, MD  metFORMIN (GLUCOPHAGE) 500 MG tablet TAKE 1 TABLET BY MOUTH EVERY EVENING. Patient taking differently: Take 500 mg by mouth at bedtime. 08/25/21   Birdie Sons, MD  omeprazole (PRILOSEC) 20 MG capsule Take 1 capsule (20 mg total) by mouth daily. 03/01/22   Birdie Sons, MD  ondansetron (ZOFRAN) 4 MG tablet TAKE 1 TABLET BY MOUTH EVERY 8 HOURS AS NEEDED FOR NAUSEA AND VOMITING 01/08/22   Birdie Sons, MD  QUEtiapine (SEROQUEL) 200 MG tablet Take 1 tablet (200 mg total) by mouth 2 (two) times daily. 02/26/22   Birdie Sons, MD  sodium bicarbonate 650 MG tablet TAKE 2 TABLETS (1,300 MG TOTAL) BY MOUTH IN THE MORNING AND AT BEDTIME. Patient taking differently: Take 1,300 mg by mouth 2 (two) times daily. 07/05/21 07/05/22  Birdie Sons, MD  spironolactone (ALDACTONE) 50 MG tablet Take 1 tablet (50 mg total) by mouth 2 (two) times daily. 03/05/22   Birdie Sons, MD      Allergies    Patient has no known allergies.    Review of Systems   Review of Systems  Physical Exam Updated Vital Signs BP 112/81 (BP Location: Right Arm)   Pulse 93  Temp 98.1 F (36.7 C)   Resp 18   LMP  (LMP Unknown) Comment: postmenapausal  SpO2 100%  Physical Exam Vitals and nursing note reviewed.  Constitutional:      Appearance: Normal appearance.  HENT:     Head: Normocephalic and atraumatic.     Mouth/Throat:     Mouth: Mucous membranes are moist.     Pharynx: Oropharynx is clear.  Eyes:     General: No scleral icterus.    Conjunctiva/sclera: Conjunctivae normal.  Pulmonary:     Effort: Pulmonary effort is normal. No respiratory distress.  Skin:    Findings: No rash.  Neurological:     Mental Status: She is alert.     Comments: Cranial nerves II through XII grossly intact.  Does not appear to have a visual field deficit.  No problem with EOMs.  PERRLA.   Moving all extremities with normal strength in bilateral upper and lower extremities  Psychiatric:        Mood and Affect: Mood normal.     ED Results / Procedures / Treatments   Labs (all labs ordered are listed, but only abnormal results are displayed) Labs Reviewed  BASIC METABOLIC PANEL - Abnormal; Notable for the following components:      Result Value   Glucose, Bld 207 (*)    Creatinine, Ser 1.61 (*)    GFR, Estimated 38 (*)    All other components within normal limits  CBG MONITORING, ED - Abnormal; Notable for the following components:   Glucose-Capillary 239 (*)    All other components within normal limits  I-STAT BETA HCG BLOOD, ED ([facility], [facility], [facility] ONLY) - Abnormal; Notable for the following components:   I-stat hCG, quantitative 5.7 (*)    All other components within normal limits  CBC  URINALYSIS, ROUTINE W REFLEX MICROSCOPIC    EKG None  Radiology No results found.  Procedures Procedures   Medications Ordered in ED Medications  lactated ringers bolus 1,000 mL (1,000 mLs Intravenous New Bag/Given 06/08/22 2043)  insulin aspart (novoLOG) injection 10 Units (10 Units Intravenous Given 06/08/22 2042)    ED Course/ Medical Decision Making/ A&P                             Medical Decision Making Amount and/or Complexity of Data Reviewed Labs: ordered.  Risk Prescription drug management.   Presenting today with concern of hyperglycemia.  Has a history of type 2 diabetes.  Differential includes but is not limited to HHS, benign hyperglycemia and DKA.  This is not an exhaustive differential.    Past Medical History / Co-morbidities / Social History: Type 2 diabetes, hyperlipidemia   Additional history: Per chart review patient had multiple episodes of gastroparesis and GI side effects from Ozempic and this appears to be why she was taken off of this medication.  It appears that she was supposed to start back on glipizide at that time.   Physical  Exam: Pertinent physical exam findings include   Lab Tests: I ordered, and personally interpreted labs.  The pertinent results include: Glucose 239 Kidney function baseline Anion gap  Cardiac Monitoring:  The patient was maintained on a cardiac monitor.  I viewed and interpreted the cardiac monitored which showed an underlying rhythm of: Sinus   Medications: Ordered fluid bolus and 10 units of insulin.  Previously was told that the patient's sugar was in the 300s not 200s.  Will give her  snacks and soda to assure that we do not drop her sugar too quickly.   MDM/Disposition: This is a 57 year old female presenting today with hyperglycemia.  She reports that she was restarted on glipizide but that the glipizide never helped her so she does not feel as though it is useful to be taking this again.  She did complain about some blurred vision so I did a complete neurologic exam.  There were no abnormalities.  Suspect this to be secondary to her hyperglycemia.  Also appears intermittent in nature.  In the emergency department today her blood sugar is 239.  She was given insulin and fluids with successful decrease in her sugar.  Normal anion gap, low suspicion HHS or DKA.  Does not appear to have a diabetic hyperglycemic emergency.  She has a follow-up appointment with her PCP tomorrow.  Believe that she is stable for discharge and can follow-up in 12 hours.  She is agreeable.   Final Clinical Impression(s) / ED Diagnoses Final diagnoses:  Hyperglycemia    Rx / DC Orders ED Discharge Orders     None      Results and diagnoses were explained to the patient. Return precautions discussed in full. Patient had no additional questions and expressed complete understanding.   This chart was dictated using voice recognition software.  Despite best efforts to proofread,  errors can occur which can change the documentation meaning.    Darliss Ridgel 06/08/22 2141    Elgie Congo, MD 06/08/22 2222

## 2022-06-08 NOTE — Discharge Instructions (Signed)
You came to the emergency department today with elevated blood sugar.  This was treated with insulin and some fluids.  It successfully came down however you will need better outpatient control of your diabetes.  Continue to work with your primary about this and keep your appointment with them tomorrow.  It was a pleasure to meet you and we hope you feel better.  Do not hesitate to return with any worsening symptoms.

## 2022-06-08 NOTE — Telephone Encounter (Addendum)
Pt called she has rechecked her blood sugar -  Current blood sugar is 346. Pt is Dizzy, with  blurred vision and is very thirsty. Rapid breathing. PT will go to ED for care.  Pt will call her daughter to come stay with her while she waits for her husband to come home to take her to the ED.   Chief Complaint: High blood glucose readings. Symptoms: blurry vision, Drowsy, thirsty, faint Frequency: Ongoing since stopping ozempic Pertinent Negatives: Patient denies vomiting, rapid breathing Disposition: '[]'$ ED /'[]'$ Urgent Care (no appt availability in office) / '[x]'$ Appointment(In office/virtual)/ '[]'$  [facility]/ '[]'$ Home Care/ '[]'$ Refused Recommended Disposition /'[]'$ [place] [facility]/ '[]'$  Follow-up with PCP Additional Notes: Pt states that blood glucose this morning was 285. Morning blood glucose measurements have gone up significantly since Ozempic was discontinued.  Pt is having s/s of high glucose, she states they are a little better than this morning.  PT has not rechecked her blood glucose since this morning. Pt will test when she gets home. PT will call back if BG is above 400 or she has s/s of rapid breathing or vomiting.   Reason for Disposition  [1] Symptoms of high blood sugar (e.g., abnormally thirsty, frequent urination, weight loss) AND [2] not able to test blood glucose  Answer Assessment - Initial Assessment Questions 1. BLOOD GLUCOSE: "What is your blood glucose level?"      285 this morning. Has not rechecked.   2. ONSET: "When did you check the blood glucose?"     This morning 3. USUAL RANGE: "What is your glucose level usually?" (e.g., usual fasting morning value, usual evening value)     120-130 on Ozempic 5. TYPE 1 or 2:  "Do you know what type of diabetes you have?"  (e.g., Type 1, Type 2, Gestational; doesn't know)      Type 2 6. INSULIN: "Do you take insulin?" "What type of insulin(s) do you use? What is the mode of delivery? (syringe, pen; injection or pump)?"       no 7. DIABETES PILLS: "Do you take any pills for your diabetes?" If Yes, ask: "Have you missed taking any pills recently?"     Yes - not missed any meds 8. OTHER SYMPTOMS: "Do you have any symptoms?" (e.g., fever, frequent urination, difficulty breathing, dizziness, weakness, vomiting)     Dizzy and blurry vision, faint, drowsy, thirsty  Protocols used: Diabetes - High Blood Sugar-A-AH

## 2022-06-09 ENCOUNTER — Ambulatory Visit (INDEPENDENT_AMBULATORY_CARE_PROVIDER_SITE_OTHER): Payer: Medicaid Other | Admitting: Family Medicine

## 2022-06-09 ENCOUNTER — Encounter: Payer: Self-pay | Admitting: Family Medicine

## 2022-06-09 VITALS — BP 105/73 | HR 97 | Temp 97.6°F | Resp 20 | Wt 209.7 lb

## 2022-06-09 DIAGNOSIS — E1122 Type 2 diabetes mellitus with diabetic chronic kidney disease: Secondary | ICD-10-CM

## 2022-06-09 DIAGNOSIS — N1832 Chronic kidney disease, stage 3b: Secondary | ICD-10-CM | POA: Diagnosis not present

## 2022-06-09 DIAGNOSIS — R739 Hyperglycemia, unspecified: Secondary | ICD-10-CM

## 2022-06-09 DIAGNOSIS — F3178 Bipolar disorder, in full remission, most recent episode mixed: Secondary | ICD-10-CM | POA: Diagnosis not present

## 2022-06-09 MED ORDER — SITAGLIPTIN PHOSPHATE 100 MG PO TABS: 100.0000 mg | ORAL_TABLET | Freq: Every day | ORAL | 1 refills | Status: DC

## 2022-06-09 MED ORDER — CLONAZEPAM 0.5 MG PO TABS: 0.5000 mg | ORAL_TABLET | Freq: Two times a day (BID) | ORAL | 1 refills | Status: AC | PRN

## 2022-06-10 ENCOUNTER — Telehealth: Payer: Self-pay | Admitting: Obstetrics and Gynecology

## 2022-06-10 NOTE — Transitions of Care (Post Inpatient/ED Visit) (Signed)
   06/10/2022  Name: Kim Obrien MRN: [id number] DOB: November 28, 1965  Today's TOC FU Call Status: Today's TOC FU Call Status:: Successful TOC FU Call Competed TOC FU Call Complete Date: 06/10/22  Transition Care Management Follow-up Telephone Call Date of Discharge: 06/08/22 [facility] Type of Discharge: Emergency Department Reason for ED Visit: Other: (hyperglycemia) How have you been since you were released from the hospital?: Better Any questions or concerns?: No  Items Reviewed: Did you receive and understand the discharge instructions provided?: Yes Medications obtained and verified?:  (N/A) Any new allergies since your discharge?: No Dietary orders reviewed?: NA Do you have support at home?: Yes People in Home: spouse Name of Support/Comfort Primary Source: [place] and Equipment/Supplies: [place] Ordered?: No Any new equipment or medical supplies ordered?: No  Functional Questionnaire: Do you need assistance with bathing/showering or dressing?: No Do you need assistance with meal preparation?: No Do you need assistance with eating?: No Do you have difficulty maintaining continence: No Do you need assistance with getting out of bed/getting out of a chair/moving?: No Do you have difficulty managing or taking your medications?: No  Folllow up appointments reviewed: PCP Follow-up appointment confirmed?: Yes Date of PCP follow-up appointment?: 06/09/22 Follow-up Provider: [facility] Follow-up appointment confirmed?: NA Do you need transportation to your follow-up appointment?: No Do you understand care options if your condition(s) worsen?: Yes-patient verbalized understanding  SDOH Interventions Today    Flowsheet Row Most Recent Value  SDOH Interventions   Food Insecurity Interventions Intervention Not Indicated  Housing Interventions Intervention Not Indicated      Aida Raider RN,  BSN [facility] Management Coordinator - Managed [place] High Risk [phone]

## 2022-06-15 ENCOUNTER — Encounter: Payer: Self-pay | Admitting: Family Medicine

## 2022-06-16 LAB — MICROALBUMIN / CREATININE URINE RATIO

## 2022-07-06 ENCOUNTER — Other Ambulatory Visit: Payer: Self-pay | Admitting: Family Medicine

## 2022-07-06 ENCOUNTER — Encounter: Payer: Self-pay | Admitting: Family Medicine

## 2022-07-06 DIAGNOSIS — K2960 Other gastritis without bleeding: Secondary | ICD-10-CM

## 2022-07-06 MED ORDER — SAXAGLIPTIN HCL 5 MG PO TABS: 5.0000 mg | ORAL_TABLET | Freq: Every day | ORAL | 6 refills | Status: DC

## 2022-07-07 ENCOUNTER — Other Ambulatory Visit: Payer: Self-pay | Admitting: Family Medicine

## 2022-07-07 DIAGNOSIS — N1832 Chronic kidney disease, stage 3b: Secondary | ICD-10-CM

## 2022-07-12 ENCOUNTER — Ambulatory Visit (INDEPENDENT_AMBULATORY_CARE_PROVIDER_SITE_OTHER): Payer: Medicaid Other | Admitting: Family Medicine

## 2022-07-12 VITALS — BP 109/80 | HR 95 | Temp 97.7°F | Wt 210.0 lb

## 2022-07-12 DIAGNOSIS — E1122 Type 2 diabetes mellitus with diabetic chronic kidney disease: Secondary | ICD-10-CM | POA: Diagnosis not present

## 2022-07-12 DIAGNOSIS — N1832 Chronic kidney disease, stage 3b: Secondary | ICD-10-CM | POA: Diagnosis not present

## 2022-07-12 NOTE — Progress Notes (Signed)
[place] Ponder [place],acting as a scribe for Lelon Huh, MD.,have documented all relevant documentation on the behalf of Lelon Huh, MD,as directed by  Lelon Huh, MD while in the presence of Lelon Huh, MD.     Established patient visit   Patient: Kim Obrien   DOB: Apr 18, 1965   57 y.o. Female  MRN: [id number] Visit Date: 07/12/2022  Today's healthcare provider: Lelon Huh, MD   Chief Complaint  Patient presents with   Diabetes   Subjective    HPI  Diabetes Mellitus Type II, Follow-up  Lab Results  Component Value Date   HGBA1C 7.8 (A) 05/31/2022   HGBA1C 7.0 (A) 02/26/2022   HGBA1C 6.9 (H) 11/18/2021   Wt Readings from Last 3 Encounters:  07/12/22 210 lb (95.3 kg)  06/09/22 209 lb 11.2 oz (95.1 kg)  06/08/22 213 lb (96.6 kg)   Last seen for diabetes 6 weeks ago.  Management at that time then includes adding low dose Glipizide and Januvia due to having elevated glucose after discontinuation of Ozempic. Since then her nephrologist increased glipizide to 5mg . Januvia was changed to Onglyza due to insurance formulary, but she has not yet started the Onglyza prescription.  She reports good compliance with treatment. She is not having side effects.  Symptoms: No fatigue No foot ulcerations  No appetite changes No nausea  No paresthesia of the feet  No polydipsia  No polyuria No visual disturbances   No vomiting     Home blood sugar records:  180-200  Episodes of hypoglycemia? No    Current insulin regiment: NA Most Recent Eye Exam: 10/28/21 Current exercise: regular Current diet habits: not really  Pertinent Labs: Lab Results  Component Value Date   CHOL 166 12/29/2020   HDL 42 12/29/2020   LDLCALC 81 12/29/2020   TRIG 260 (H) 12/29/2020   CHOLHDL 4.0 12/29/2020   Lab Results  Component Value Date   NA 138 06/08/2022   K 3.7 06/08/2022   CREATININE 1.61 (H) 06/08/2022   GFRNONAA 38 (L) 06/08/2022   LABMICR 20.2 08/03/2021   MICRALBCREAT  22 08/03/2021     ---------------------------------------------------------------------------------------------------   Medications: Outpatient Medications Prior to Visit  Medication Sig   allopurinol (ZYLOPRIM) 100 MG tablet Take 1 tablet (100 mg total) by mouth daily.   Calcium Carbonate Antacid (TUMS PO) Take 2 tablets by mouth as needed.   clonazePAM (KLONOPIN) 0.5 MG tablet Take 1 tablet (0.5 mg total) by mouth 2 (two) times daily as needed for anxiety.   dicyclomine (BENTYL) 10 MG capsule TAKE 1 CAPSULE (10 MG TOTAL) BY MOUTH 4 TIMES A DAY BEFORE MEALS AND AT BEDTIME   FARXIGA 10 MG TABS tablet Take 10 mg by mouth daily.   glipiZIDE (GLUCOTROL XL) 5 MG 24 hr tablet Take 1 tablet (5 mg total) by mouth daily with breakfast.   losartan (COZAAR) 25 MG tablet Take 25 mg by mouth at bedtime.   lovastatin (MEVACOR) 20 MG tablet TAKE 1 TABLET BY MOUTH EVERY DAY (Patient taking differently: Take 20 mg by mouth daily.)   metFORMIN (GLUCOPHAGE) 500 MG tablet TAKE 1 TABLET BY MOUTH EVERY EVENING. (Patient taking differently: Take 500 mg by mouth at bedtime.)   metoCLOPramide (REGLAN) 5 MG tablet Take 5 mg by mouth 4 (four) times daily -  before meals and at bedtime.   omeprazole (PRILOSEC) 20 MG capsule TAKE 1 CAPSULE BY MOUTH EVERY DAY   ondansetron (ZOFRAN) 4 MG tablet TAKE 1 TABLET BY MOUTH EVERY 8  HOURS AS NEEDED FOR NAUSEA AND VOMITING   QUEtiapine (SEROQUEL) 200 MG tablet Take 1 tablet (200 mg total) by mouth 2 (two) times daily.   saxagliptin HCl (ONGLYZA) 5 MG TABS tablet Take 1 tablet (5 mg total) by mouth daily. TAKE IN PLACE OF JANUVIA (PATIENT HAS NOT YET MADE CHANGE)   sodium bicarbonate 650 MG tablet Take 2 tablets (1,300 mg total) by mouth 2 (two) times daily.   spironolactone (ALDACTONE) 50 MG tablet Take 1 tablet (50 mg total) by mouth 2 (two) times daily. (Patient taking differently: Take 50 mg by mouth daily.)   No facility-administered medications prior to visit.    Review  of Systems  Constitutional:  Negative for appetite change, chills, fatigue and fever.  Respiratory:  Negative for chest tightness and shortness of breath.   Cardiovascular:  Negative for chest pain and palpitations.  Gastrointestinal:  Negative for abdominal pain, nausea and vomiting.  Neurological:  Negative for dizziness and weakness.       Objective    BP 109/80 (BP Location: Left Arm, Patient Position: Sitting, Cuff Size: Normal)   Pulse 95   Temp 97.7 F (36.5 C) (Oral)   Wt 210 lb (95.3 kg)   LMP  (LMP Unknown) Comment: postmenapausal  SpO2 99%   BMI 37.20 kg/m      Assessment & Plan     1. Type 2 diabetes mellitus with stage 3b chronic kidney disease, without long-term current use of insulin Doing well with initiation of Januvia and recently increased glipizide to 5 mg by nephrologist. Insurance formulary required that we change Januvia to [place]. Will give meds about 3 more week then she is to stop by lab to check HgB A1c       The entirety of the information documented in the History of Present Illness, Review of Systems and Physical Exam were personally obtained by me. Portions of this information were initially documented by the CMA and reviewed by me for thoroughness and accuracy.     Lelon Huh, MD  [place] [phone] (phone) [phone] (fax)  [place]

## 2022-08-03 LAB — HEMOGLOBIN A1C
Est. average glucose Bld gHb Est-mCnc: 166 mg/dL
Hgb A1c MFr Bld: 7.4 % — ABNORMAL HIGH (ref 4.8–5.6)

## 2022-08-04 ENCOUNTER — Encounter: Payer: Self-pay | Admitting: Family Medicine

## 2022-08-04 ENCOUNTER — Other Ambulatory Visit: Payer: Self-pay | Admitting: Family Medicine

## 2022-08-04 DIAGNOSIS — K589 Irritable bowel syndrome without diarrhea: Secondary | ICD-10-CM

## 2022-08-05 NOTE — Telephone Encounter (Signed)
Requested Prescriptions  Pending Prescriptions Disp Refills   dicyclomine (BENTYL) 10 MG capsule [Pharmacy Med Name: DICYCLOMINE 10 MG CAPSULE] 360 capsule 0    Sig: TAKE 1 CAPSULE (10 MG TOTAL) BY MOUTH 4 TIMES A DAY BEFORE MEALS AND AT BEDTIME     Gastroenterology:  Antispasmodic Agents Passed - 08/04/2022  9:34 PM      Passed - Valid encounter within last 12 months    Recent Outpatient Visits           3 weeks ago Type 2 diabetes mellitus with stage 3b chronic kidney disease, without long-term current use of insulin   [place] [facility] Malva Limes, MD   1 month ago Hyperglycemia   [place] [facility] Malva Limes, MD   2 months ago Type 2 diabetes mellitus with stage 3b chronic kidney disease, without long-term current use of insulin (HCC)   [place] [facility] Malva Limes, MD   5 months ago Type 2 diabetes mellitus with stage 3b chronic kidney disease, without long-term current use of insulin (HCC)   [place] [facility] Malva Limes, MD   7 months ago Nausea and vomiting, unspecified vomiting type   [facility] Malva Limes, MD

## 2022-08-06 ENCOUNTER — Telehealth: Payer: Self-pay | Admitting: Family Medicine

## 2022-08-06 NOTE — Telephone Encounter (Signed)
Covermymeds requesting prior authorization Key: [id number] Name: [place] No medication listed

## 2022-08-24 ENCOUNTER — Other Ambulatory Visit: Payer: Self-pay | Admitting: Family Medicine

## 2022-08-24 NOTE — Telephone Encounter (Signed)
Unable to refill per protocol, Rx expired. Discontinued 07/06/22, cost of medication.  Requested Prescriptions  Pending Prescriptions Disp Refills   JANUVIA 100 MG tablet [Pharmacy Med Name: JANUVIA 100 MG TABLET] 30 tablet 1    Sig: TAKE 1 TABLET BY MOUTH EVERY DAY     Endocrinology:  Diabetes - DPP-4 Inhibitors Failed - 08/24/2022  2:45 AM      Failed - Cr in normal range and within 360 days    Creatinine, Ser  Date Value Ref Range Status  06/08/2022 1.61 (H) 0.44 - 1.00 mg/dL Final         Passed - HBA1C is between 0 and 7.9 and within 180 days    Hemoglobin A1C  Date Value Ref Range Status  08/13/2019 9.9  Final   Hgb A1c MFr Bld  Date Value Ref Range Status  08/02/2022 7.4 (H) 4.8 - 5.6 % Final    Comment:             Prediabetes: 5.7 - 6.4          Diabetes: >6.4          Glycemic control for adults with diabetes: <7.0          Passed - Valid encounter within last 6 months    Recent Outpatient Visits           1 month ago Type 2 diabetes mellitus with stage 3b chronic kidney disease, without long-term current use of insulin (HCC)   [place] [facility] Malva Limes, MD   2 months ago Hyperglycemia   [place] [facility] Malva Limes, MD   2 months ago Type 2 diabetes mellitus with stage 3b chronic kidney disease, without long-term current use of insulin (HCC)   [place] [facility] Malva Limes, MD   5 months ago Type 2 diabetes mellitus with stage 3b chronic kidney disease, without long-term current use of insulin (HCC)    [place] [facility] Malva Limes, MD   8 months ago Nausea and vomiting, unspecified vomiting type   [facility] Malva Limes, MD

## 2022-09-02 ENCOUNTER — Other Ambulatory Visit: Payer: Self-pay | Admitting: Family Medicine

## 2022-09-02 DIAGNOSIS — K589 Irritable bowel syndrome without diarrhea: Secondary | ICD-10-CM

## 2022-09-02 NOTE — Telephone Encounter (Signed)
Rx 08/05/22 #360- too soon Requested Prescriptions  Pending Prescriptions Disp Refills   dicyclomine (BENTYL) 10 MG capsule [Pharmacy Med Name: DICYCLOMINE 10 MG CAPSULE] 360 capsule 0    Sig: TAKE 1 CAPSULE (10 MG TOTAL) BY MOUTH 4 TIMES A DAY BEFORE MEALS AND AT BEDTIME     Gastroenterology:  Antispasmodic Agents Passed - 09/02/2022  2:19 AM      Passed - Valid encounter within last 12 months    Recent Outpatient Visits           1 month ago Type 2 diabetes mellitus with stage 3b chronic kidney disease, without long-term current use of insulin (HCC)   [place] [facility] Malva Limes, MD   2 months ago Hyperglycemia   [place] [facility] Malva Limes, MD   3 months ago Type 2 diabetes mellitus with stage 3b chronic kidney disease, without long-term current use of insulin (HCC)   [place] [facility] Malva Limes, MD   6 months ago Type 2 diabetes mellitus with stage 3b chronic kidney disease, without long-term current use of insulin (HCC)   [place] [facility] Malva Limes, MD   8 months ago Nausea and vomiting, unspecified vomiting type   [place] [facility] Malva Limes, MD

## 2022-09-05 ENCOUNTER — Other Ambulatory Visit: Payer: Self-pay | Admitting: Family Medicine

## 2022-09-05 DIAGNOSIS — N1832 Chronic kidney disease, stage 3b: Secondary | ICD-10-CM

## 2022-10-18 ENCOUNTER — Emergency Department: Payer: Medicaid Other

## 2022-10-18 ENCOUNTER — Emergency Department
Admission: EM | Admit: 2022-10-18 | Discharge: 2022-10-18 | Disposition: A | Discharge status: Home / Self Care | Payer: Medicaid Other | Attending: Emergency Medicine | Admitting: Emergency Medicine

## 2022-10-18 ENCOUNTER — Encounter: Payer: Self-pay | Admitting: Emergency Medicine

## 2022-10-18 ENCOUNTER — Ambulatory Visit: Payer: Self-pay | Admitting: *Deleted

## 2022-10-18 ENCOUNTER — Other Ambulatory Visit: Payer: Self-pay

## 2022-10-18 DIAGNOSIS — E119 Type 2 diabetes mellitus without complications: Secondary | ICD-10-CM | POA: Insufficient documentation

## 2022-10-18 DIAGNOSIS — R0789 Other chest pain: Secondary | ICD-10-CM | POA: Insufficient documentation

## 2022-10-18 LAB — TROPONIN I (HIGH SENSITIVITY): Troponin I (High Sensitivity): 5 ng/L (ref <18)

## 2022-10-18 LAB — CBC
HCT: 42.5 % (ref 36.0–46.0)
Hemoglobin: 13.8 g/dL (ref 12.0–15.0)
MCH: 30.3 pg (ref 26.0–34.0)
MCHC: 32.5 g/dL (ref 30.0–36.0)
MCV: 93.2 fL (ref 80.0–100.0)
Platelets: 200 10*3/uL (ref 150–400)
RBC: 4.56 MIL/uL (ref 3.87–5.11)
RDW: 13.7 % (ref 11.5–15.5)
WBC: 7.3 10*3/uL (ref 4.0–10.5)
nRBC: 0 % (ref 0.0–0.2)

## 2022-10-18 LAB — BASIC METABOLIC PANEL
Anion gap: 9 (ref 5–15)
BUN: 23 mg/dL — ABNORMAL HIGH (ref 6–20)
CO2: 22 mmol/L (ref 22–32)
Calcium: 9.1 mg/dL (ref 8.9–10.3)
Chloride: 104 mmol/L (ref 98–111)
Creatinine, Ser: 1.67 mg/dL — ABNORMAL HIGH (ref 0.44–1.00)
GFR, Estimated: 36 mL/min — ABNORMAL LOW (ref >60)
Glucose, Bld: 233 mg/dL — ABNORMAL HIGH (ref 70–99)
Potassium: 4.3 mmol/L (ref 3.5–5.1)
Sodium: 135 mmol/L (ref 135–145)

## 2022-10-18 MED ORDER — NAPROXEN 500 MG PO TABS: 500.0000 mg | ORAL_TABLET | Freq: Two times a day (BID) | ORAL | 2 refills | Status: DC

## 2022-10-18 MED ORDER — IOHEXOL 350 MG/ML SOLN
80.0000 mL | Freq: Once | INTRAVENOUS | Status: AC | PRN
  Administered 2022-10-18: 75 mL via INTRAVENOUS

## 2022-10-18 MED ORDER — KETOROLAC TROMETHAMINE 30 MG/ML IJ SOLN
30.0000 mg | Freq: Once | INTRAMUSCULAR | Status: AC
  Administered 2022-10-18: 30 mg via INTRAVENOUS
  Filled 2022-10-18: qty 1

## 2022-10-18 NOTE — ED Provider Notes (Signed)
[facility] Provider Note    Event Date/Time   First MD Initiated Contact with Patient 10/18/22 1315     (approximate)   History   Chest Pain   HPI  Kim Obrien is a 57 y.o. female with history of bipolar disorder, diabetes who presents with complaints of central chest discomfort which started yesterday.  She reports she woke up with it.  She reports it is worse with deep inspiration.  No injury to the area.  No fevers chills or cough.  No history of DVT.  No recent travel.  No calf pain or swelling.      Physical Exam   Triage Vital Signs: ED Triage Vitals  Enc Vitals Group     BP 10/18/22 1251 126/79     Pulse Rate 10/18/22 1251 (!) 112     Resp 10/18/22 1251 19     Temp 10/18/22 1251 98 F (36.7 C)     Temp src --      SpO2 10/18/22 1251 100 %     Weight 10/18/22 1329 95.3 kg (210 lb 1.6 oz)     Height 10/18/22 1329 1.6 m (5\' 3" )     Head Circumference --      Peak Flow --      Pain Score 10/18/22 1250 6     Pain Loc --      Pain Edu? --      Excl. in GC? --     Most recent vital signs: Vitals:   10/18/22 1251 10/18/22 1613  BP: 126/79 120/70  Pulse: (!) 112 94  Resp: 19 18  Temp: 98 F (36.7 C)   SpO2: 100% 100%     General: Awake, no distress.  CV:  Good peripheral perfusion.  Mild tenderness over the sternum where she describes the pain but no bruising swelling or rash Resp:  Normal effort.  Clear to auscultation bilaterally Abd:  No distention.  Other:     ED Results / Procedures / Treatments   Labs (all labs ordered are listed, but only abnormal results are displayed) Labs Reviewed  BASIC METABOLIC PANEL - Abnormal; Notable for the following components:      Result Value   Glucose, Bld 233 (*)    BUN 23 (*)    Creatinine, Ser 1.67 (*)    GFR, Estimated 36 (*)    All other components within normal limits  CBC  TROPONIN I (HIGH SENSITIVITY)     EKG  ED ECG REPORT I, Jene Every, the attending  physician, personally viewed and interpreted this ECG.  Date: 10/18/2022  Rhythm: Sinus tachycardia QRS Axis: normal Intervals: normal ST/T Wave abnormalities: normal Narrative Interpretation: no evidence of acute ischemia    RADIOLOGY Chest x-ray viewed interpreted by me, no pneumothorax    PROCEDURES:  Critical Care performed:   Procedures   MEDICATIONS ORDERED IN ED: Medications  iohexol (OMNIPAQUE) 350 MG/ML injection 80 mL (75 mLs Intravenous Contrast Given 10/18/22 1444)  ketorolac (TORADOL) 30 MG/ML injection 30 mg (30 mg Intravenous Given 10/18/22 1550)     IMPRESSION / MDM / ASSESSMENT AND PLAN / ED COURSE  I reviewed the triage vital signs and the nursing notes. Patient's presentation is most consistent with acute presentation with potential threat to life or bodily function.  Patient presents with chest pain as detailed above, differential includes ACS, PE, pneumonia, esophagitis  EKG demonstrates sinus tachycardia, given pleurisy sinus tachycardia age and multiple comorbidities, will send for  CT angiography  High sensitivity troponin is normal, not consistent with ACS, no evidence of pneumonia or pneumothorax on chest x-ray.  CT angiography is negative for PE, discussed with patient, she was reassured, offered admission however the patient would prefer discharge with close follow-up with cardiology, she knows to return if any worsening      FINAL CLINICAL IMPRESSION(S) / ED DIAGNOSES   Final diagnoses:  Atypical chest pain     Rx / DC Orders   ED Discharge Orders          Ordered    Ambulatory referral to Cardiology       Comments: If you have not heard from the Cardiology office within the next 72 hours please call [phone].   10/18/22 1550    naproxen (NAPROSYN) 500 MG tablet  2 times daily with meals        10/18/22 1550             Note:  This document was prepared using Dragon voice recognition software and may include  unintentional dictation errors.   Jene Every, MD 10/18/22 [id number]

## 2022-10-18 NOTE — ED Triage Notes (Signed)
Pt comes with c/o mid sternal cp and sob for few days. Pt states it is painful to take in deep breath. Pt states sharp pain. Pt states 6/10 pain.  Pt states some dizziness and diarrhea. Pt denies any N/V.

## 2022-10-18 NOTE — ED Notes (Signed)
See triage note  Presents with chest pain which started yesterday  States pain has been constant  No SOB or n/v or diaphoresis  But states she did have some dizziness

## 2022-10-18 NOTE — Telephone Encounter (Signed)
  Chief Complaint: chest pain- all over, SOB Symptoms: chest pain- all over- was coming/going- but now more constant, SOB with deep breathing Frequency: started yesterday  Pertinent Negatives: Patient denies dizziness, nausea, vomiting, sweating, fever, cough Disposition: [x] ED /[] Urgent Care (no appt availability in office) / [] Appointment(In office/virtual)/ []  [facility]/ [] Home Care/ [] Refused Recommended Disposition /[] [facility]/ []  Follow-up with PCP Additional Notes: Patient advised ED for evaluation of symptoms   Reason for Disposition  [1] Chest pain (or "angina") comes and goes AND [2] is happening more often (increasing in frequency) or getting worse (increasing in severity)  (Exception: Chest pains that last only a few seconds.)  Answer Assessment - Initial Assessment Questions 1. LOCATION: "Where does it hurt?"       Chest-all over 2. RADIATION: "Does the pain go anywhere else?" (e.g., into neck, jaw, arms, back)     no 3. ONSET: "When did the chest pain begin?" (Minutes, hours or days)      yesterday 4. PATTERN: "Does the pain come and go, or has it been constant since it started?"  "Does it get worse with exertion?"      Comes and goes- more constant- unsure 5. DURATION: "How long does it last" (e.g., seconds, minutes, hours)     Pretty constant- hard to get breath  7. CARDIAC RISK FACTORS: "Do you have any history of heart problems or risk factors for heart disease?" (e.g., angina, prior heart attack; diabetes, high blood pressure, high cholesterol, smoker, or strong family history of heart disease)     no 8. PULMONARY RISK FACTORS: "Do you have any history of lung disease?"  (e.g., blood clots in lung, asthma, emphysema, birth control pills)     no 9. CAUSE: "What do you think is causing the chest pain?"     Unsure 10. OTHER SYMPTOMS: "Do you have any other symptoms?" (e.g., dizziness, nausea, vomiting, sweating, fever, difficulty breathing,  cough)       difficulty breathing  Protocols used: Chest Pain-A-AH

## 2022-10-19 ENCOUNTER — Telehealth: Payer: Self-pay

## 2022-10-19 NOTE — Transitions of Care (Post Inpatient/ED Visit) (Signed)
   10/19/2022  Name: Kim Obrien MRN: [id number] DOB: June 18, 1965  Today's TOC FU Call Status: Today's TOC FU Call Status:: Unsuccessul Call (1st Attempt) Unsuccessful Call (1st Attempt) Date: 10/19/22  Attempted to reach the patient regarding the most recent Inpatient/ED visit.  Follow Up Plan: Additional outreach attempts will be made to reach the patient to complete the Transitions of Care (Post Inpatient/ED visit) call.   Abelino Derrick, MHA [facility]  Managed [facility] Social Worker [phone]

## 2022-10-21 ENCOUNTER — Encounter: Payer: Self-pay | Admitting: Cardiology

## 2022-10-21 ENCOUNTER — Telehealth: Payer: Self-pay

## 2022-10-21 ENCOUNTER — Ambulatory Visit: Payer: Medicaid Other | Attending: Cardiology | Admitting: Cardiology

## 2022-10-21 VITALS — BP 110/80 | HR 99 | Ht 63.0 in | Wt 216.8 lb

## 2022-10-21 DIAGNOSIS — Z6838 Body mass index (BMI) 38.0-38.9, adult: Secondary | ICD-10-CM | POA: Diagnosis not present

## 2022-10-21 DIAGNOSIS — I2584 Coronary atherosclerosis due to calcified coronary lesion: Secondary | ICD-10-CM

## 2022-10-21 DIAGNOSIS — I2510 Atherosclerotic heart disease of native coronary artery without angina pectoris: Secondary | ICD-10-CM | POA: Diagnosis not present

## 2022-10-21 DIAGNOSIS — R072 Precordial pain: Secondary | ICD-10-CM | POA: Diagnosis not present

## 2022-10-21 MED ORDER — ASPIRIN 81 MG PO TBEC: 81.0000 mg | DELAYED_RELEASE_TABLET | Freq: Every day | ORAL | Status: AC

## 2022-10-21 NOTE — Progress Notes (Signed)
Cardiology Office Note:    Date:  10/21/2022   ID:  Kim Obrien, DOB [birth year], MRN [id number]  PCP:  Malva Limes, MD   [facility] Providers Cardiologist:  None     Referring MD: Malva Limes, MD   Chief Complaint  Patient presents with   New Patient (Initial Visit)    Referred by ED for cardiology work up following ED visit on 10/18/22 for chest pains.  Patient reports antiinflammatory med given at ED was releived the chest pain.    History of Present Illness:    Kim Obrien is a 57 y.o. female with a hx of diabetes, CKD 3, presenting with chest pain.    States having symptoms of chest pain 3 days ago while sitting at home.  Taking deep breaths makes pain worse.  Presented to the ED, chest CT obtained 10/18/2022 showed no PE, coronary calcifications noted in LAD and left circumflex.  Due to pleuritic nature of chest pain, patient started on naproxen x 10-day course.  Symptoms of chest pain with taking deep breaths much improved.  Has some tenderness with palpation of chest.  Past Medical History:  Diagnosis Date   Arthritis    Bipolar 1 disorder (HCC)    Diabetes mellitus without complication (HCC)    History of chicken pox     Past Surgical History:  Procedure Laterality Date   ESOPHAGOGASTRODUODENOSCOPY N/A 04/29/2022   Procedure: ESOPHAGOGASTRODUODENOSCOPY (EGD);  Surgeon: Jaynie Collins, DO;  Location: [facility] ENDOSCOPY;  Service: Gastroenterology;  Laterality: N/A;   LAPAROSCOPY Left 11/04/2017   Procedure: operative laparoscopy, aspiration of left ovarian cyst, left salpingo-oopherectomy;  Surgeon: Conard Novak, MD;  Location: [facility] ORS;  Service: Gynecology;  Laterality: Left;   pap smear  2008   in [place]   partial wisdom tooth extraction  2004    Current Medications: Current Meds  Medication Sig   allopurinol (ZYLOPRIM) 100 MG tablet Take 1 tablet (100 mg total) by mouth daily.   aspirin EC 81 MG tablet Take 1 tablet (81 mg  total) by mouth daily. Swallow whole.   Calcium Carbonate Antacid (TUMS PO) Take 2 tablets by mouth as needed.   clonazePAM (KLONOPIN) 0.5 MG tablet Take 1 tablet (0.5 mg total) by mouth 2 (two) times daily as needed for anxiety.   dicyclomine (BENTYL) 10 MG capsule TAKE 1 CAPSULE (10 MG TOTAL) BY MOUTH 4 TIMES A DAY BEFORE MEALS AND AT BEDTIME   FARXIGA 10 MG TABS tablet Take 10 mg by mouth daily.   glipiZIDE (GLUCOTROL XL) 5 MG 24 hr tablet Take 5 mg by mouth in the morning and at bedtime.   losartan (COZAAR) 25 MG tablet Take 25 mg by mouth at bedtime.   lovastatin (MEVACOR) 20 MG tablet TAKE 1 TABLET BY MOUTH EVERY DAY (Patient taking differently: Take 20 mg by mouth daily.)   metFORMIN (GLUCOPHAGE) 500 MG tablet Take 1 tablet (500 mg total) by mouth every evening. Please schedule office visit before any future refill.   naproxen (NAPROSYN) 500 MG tablet Take 1 tablet (500 mg total) by mouth 2 (two) times daily with a meal.   omeprazole (PRILOSEC) 20 MG capsule TAKE 1 CAPSULE BY MOUTH EVERY DAY   ondansetron (ZOFRAN) 4 MG tablet TAKE 1 TABLET BY MOUTH EVERY 8 HOURS AS NEEDED FOR NAUSEA AND VOMITING   QUEtiapine (SEROQUEL) 200 MG tablet Take 1 tablet (200 mg total) by mouth 2 (two) times daily.   saxagliptin HCl (ONGLYZA)  5 MG TABS tablet Take 1 tablet (5 mg total) by mouth daily. TAKE IN PLACE OF JANUVIA   sodium bicarbonate 650 MG tablet Take 2 tablets (1,300 mg total) by mouth 2 (two) times daily.   spironolactone (ALDACTONE) 50 MG tablet Take 1 tablet (50 mg total) by mouth 2 (two) times daily. (Patient taking differently: Take 50 mg by mouth daily.)     Allergies:   Patient has no known allergies.   Social History   Socioeconomic History   Marital status: Married    Spouse name: Not on file   Number of children: 2   Years of education: Not on file   Highest education level: Not on file  Occupational History   Not on file  Tobacco Use   Smoking status: Never   Smokeless  tobacco: Never  Vaping Use   Vaping status: Never Used  Substance and Sexual Activity   Alcohol use: No    Alcohol/week: 0.0 standard drinks of alcohol   Drug use: No   Sexual activity: Yes  Other Topics Concern   Not on file  Social History Narrative   Not on file   Social Determinants of Health   Financial Resource Strain: Not on file  Food Insecurity: No Food Insecurity (06/15/2022)   [facility]   Hunger Vital Sign    Worried About Running Out of Food in the Last Year: Never true    Ran Out of Food in the Last Year: Never true  Transportation Needs: No Transportation Needs (06/15/2022)   [facility]   [facility] - Transportation    Lack of Transportation (Medical): No    Lack of Transportation (Non-Medical): No  Physical Activity: Not on file  Stress: Not on file  Social Connections: Not on file     Family History: The patient's family history includes Anxiety disorder in her mother and sister; Bipolar disorder in her father; Depression in her mother; Diabetes in her mother; Gout in her father; Heart disease in her mother; Hypertension in her father; Prostate cancer in her father. There is no history of Breast cancer.  ROS:   Please see the history of present illness.     All other systems reviewed and are negative.  EKGs/Labs/Other Studies Reviewed:    The following studies were reviewed today:  EKG Interpretation Date/Time:  Thursday October 21 2022 13:42:05 EDT Ventricular Rate:  99 PR Interval:  154 QRS Duration:  86 QT Interval:  362 QTC Calculation: 464 R Axis:   -33  Text Interpretation: Normal sinus rhythm Left axis deviation Low voltage QRS Confirmed by Debbe Odea ([id number]) on 10/21/2022 2:27:45 PM    Recent Labs: 11/18/2021: ALT 19 10/18/2022: BUN 23; Creatinine, Ser 1.67; Hemoglobin 13.8; Platelets 200; Potassium 4.3; Sodium 135  Recent Lipid Panel    Component Value Date/Time    CHOL 166 12/29/2020 1018   TRIG 260 (H) 12/29/2020 1018   HDL 42 12/29/2020 1018   CHOLHDL 4.0 12/29/2020 1018   LDLCALC 81 12/29/2020 1018     Risk Assessment/Calculations:            Physical Exam:    VS:  BP 110/80 (BP Location: Right Arm, Patient Position: Sitting, Cuff Size: Normal)   Pulse 99   Ht 5\' 3"  (1.6 m)   Wt 216 lb 12.8 oz (98.3 kg)   LMP  (LMP Unknown) Comment: postmenapausal  SpO2 97%   BMI 38.40 kg/m  Wt Readings from Last 3 Encounters:  10/21/22 216 lb 12.8 oz (98.3 kg)  10/18/22 210 lb 1.6 oz (95.3 kg)  07/12/22 210 lb (95.3 kg)     GEN:  Well nourished, well developed in no acute distress HEENT: Normal NECK: No JVD; No carotid bruits CARDIAC: RRR, no murmurs, rubs, gallops RESPIRATORY:  Clear to auscultation without rales, wheezing or rhonchi  ABDOMEN: Soft, non-tender, non-distended MUSCULOSKELETAL:  No edema; chest tenderness with palpation. SKIN: Warm and dry NEUROLOGIC:  Alert and oriented x 3 PSYCHIATRIC:  Normal affect   ASSESSMENT:    1. Precordial pain   2. Coronary artery calcification   3. BMI 38.0-38.9,adult    PLAN:    In order of problems listed above:  Chest pain, appears pleuritic, overall resolved with taking NSAIDs/naproxen.  Complete course of naproxen as scheduled.  Also has a musculoskeletal component of chest pain, reproducible with palpation.  Obtain echo ?  Pericarditis. Coronary calcifications, obtain Lexiscan Myoview.  Start aspirin 81 mg, continue lovastatin 20 mg daily. Obesity, low-calorie diet.  Follow-up after echo and Myoview.      Informed Consent   Shared Decision Making/Informed Consent The risks [chest pain, shortness of breath, cardiac arrhythmias, dizziness, blood pressure fluctuations, myocardial infarction, stroke/transient ischemic attack, nausea, vomiting, allergic reaction, radiation exposure, metallic taste sensation and life-threatening complications (estimated to be 1 in 10,000)],  benefits (risk stratification, diagnosing coronary artery disease, treatment guidance) and alternatives of a nuclear stress test were discussed in detail with Ms. Hendricksen and she agrees to proceed.       Medication Adjustments/Labs and Tests Ordered: Current medicines are reviewed at length with the patient today.  Concerns regarding medicines are outlined above.  Orders Placed This Encounter  Procedures   NM Myocar Multi W/Spect W/Wall Motion / EF   EKG 12-Lead   ECHOCARDIOGRAM COMPLETE   Meds ordered this encounter  Medications   aspirin EC 81 MG tablet    Sig: Take 1 tablet (81 mg total) by mouth daily. Swallow whole.    Patient Instructions  Medication Instructions:  - Your physician has recommended you make the following change in your medication:   1) START Aspirin 81 mg: - take 1 tablet by mouth once daily   *If you need a refill on your cardiac medications before your next appointment, please call your pharmacy*   Lab Work: - none ordered  If you have labs (blood work) drawn today and your tests are completely normal, you will receive your results only by: MyChart Message (if you have MyChart) OR A paper copy in the mail If you have any lab test that is abnormal or we need to change your treatment, we will call you to review the results.   Testing/Procedures:  1) Echocardiogram: - Your physician has requested that you have an echocardiogram. Echocardiography is a painless test that uses sound waves to create images of your heart. It provides your doctor with information about the size and shape of your heart and how well your heart's chambers and valves are working. This procedure takes approximately one hour. There are no restrictions for this procedure. There is a possibility that an IV may need to be started during your test to inject an image enhancing agent. This is done to obtain more optimal pictures of your heart. Therefore we ask that you do at least drink some  water prior to coming in to hydrate your veins.    Please do NOT wear cologne, perfume, aftershave, or  lotions (deodorant is allowed). Please arrive 15 minutes prior to your appointment time.   2) Lexiscan Myoview (Chemical Stress Test/ Cardiac Nuclear Scan): - Your physician has requested that you have a lexiscan myoview.   [facility] MYOVIEW  Your Provider has ordered a Stress Test with nuclear imaging. The purpose of this test is to evaluate the blood supply to your heart muscle. This procedure is referred to as a "Non-Invasive Stress Test." This is because other than having an IV started in your vein, nothing is inserted or "invades" your body. Cardiac stress tests are done to find areas of poor blood flow to the heart by determining the extent of coronary artery disease (CAD). Some patients exercise on a treadmill, which naturally increases the blood flow to your heart, while others who are unable to walk on a treadmill due to physical limitations have a pharmacologic/chemical stress agent called Lexiscan. This medicine will mimic walking on a treadmill by temporarily increasing your coronary blood flow.     REPORT TO [facility] MEDICAL MALL ENTRANCE  **Proceed to the 1st desk on the right, REGISTRATION, to check in**  Please note: this test may take anywhere between 2-4 hours to complete    Instructions regarding medication:   _xx__:   You may take all of your regular morning medications the day of your test unless listed below.    _xx___:  Hold other medications as follows: Hold all oral diabetic medications the morning of your test. You may take these once your test is completed.     How to prepare for your Myoview test:  Do not eat or drink for 6 hours prior to the test No caffeine for 24 hours prior to the test No smoking 24 hours prior to the test. Ladies, please do not wear dresses.  Skirts or pants are appropriate. Please wear a short sleeve shirt. No perfume, cologne or  lotion. Wear comfortable walking shoes. No heels!   PLEASE NOTIFY THE OFFICE AT LEAST 24 HOURS IN ADVANCE IF YOU ARE UNABLE TO KEEP YOUR APPOINTMENT.  [phone] AND  PLEASE NOTIFY NUCLEAR MEDICINE AT [facility] AT LEAST 24 HOURS IN ADVANCE IF YOU ARE UNABLE TO KEEP YOUR APPOINTMENT. [phone]      Follow-Up: At [facility], you and your health needs are our priority.  As part of our continuing mission to provide you with exceptional heart care, we have created designated Provider Care Teams.  These Care Teams include your primary Cardiologist (physician) and Advanced Practice Providers (APPs -  Physician Assistants and Nurse Practitioners) who all work together to provide you with the care you need, when you need it.  We recommend signing up for the patient portal called "MyChart".  Sign up information is provided on this After Visit Summary.  MyChart is used to connect with patients for Virtual Visits (Telemedicine).  Patients are able to view lab/test results, encounter notes, upcoming appointments, etc.  Non-urgent messages can be sent to your provider as well.   To learn more about what you can do with MyChart, go to [email or web address].    Your next appointment:   2 month(s)  Provider:   You may see Debbe Odea, MD or one of the following Advanced Practice Providers on your designated Care Team:   Nicolasa Ducking, NP Eula Listen, PA-C Cadence Fransico Michael, PA-C Charlsie Quest, NP    Other Instructions Echocardiogram An echocardiogram is a test that uses sound waves (ultrasound) to produce images of the heart. Images from an echocardiogram  can provide important information about: Heart size and shape. The size and thickness and movement of your heart's walls. Heart muscle function and strength. Heart valve function or if you have stenosis. Stenosis is when the heart valves are too narrow. If blood is flowing backward through the heart valves (regurgitation). A  tumor or infectious growth around the heart valves. Areas of heart muscle that are not working well because of poor blood flow or injury from a heart attack. Aneurysm detection. An aneurysm is a weak or damaged part of an artery wall. The wall bulges out from the normal force of blood pumping through the body. Tell a health care provider about: Any allergies you have. All medicines you are taking, including vitamins, herbs, eye drops, creams, and over-the-counter medicines. Any blood disorders you have. Any surgeries you have had. Any medical conditions you have. Whether you are pregnant or may be pregnant. What are the risks? Generally, this is a safe test. However, problems may occur, including an allergic reaction to dye (contrast) that may be used during the test. What happens before the test? No specific preparation is needed. You may eat and drink normally. What happens during the test?  You will take off your clothes from the waist up and put on a hospital gown. Electrodes or electrocardiogram (ECG)patches may be placed on your chest. The electrodes or patches are then connected to a device that monitors your heart rate and rhythm. You will lie down on a table for an ultrasound exam. A gel will be applied to your chest to help sound waves pass through your skin. A handheld device, called a transducer, will be pressed against your chest and moved over your heart. The transducer produces sound waves that travel to your heart and bounce back (or "echo" back) to the transducer. These sound waves will be captured in real-time and changed into images of your heart that can be viewed on a video monitor. The images will be recorded on a computer and reviewed by your health care provider. You may be asked to change positions or hold your breath for a short time. This makes it easier to get different views or better views of your heart. In some cases, you may receive contrast through an IV in one  of your veins. This can improve the quality of the pictures from your heart. The procedure may vary among health care providers and hospitals. What can I expect after the test? You may return to your normal, everyday life, including diet, activities, and medicines, unless your health care provider tells you not to do that. Follow these instructions at home: It is up to you to get the results of your test. Ask your health care provider, or the department that is doing the test, when your results will be ready. Keep all follow-up visits. This is important. Summary An echocardiogram is a test that uses sound waves (ultrasound) to produce images of the heart. Images from an echocardiogram can provide important information about the size and shape of your heart, heart muscle function, heart valve function, and other possible heart problems. You do not need to do anything to prepare before this test. You may eat and drink normally. After the echocardiogram is completed, you may return to your normal, everyday life, unless your health care provider tells you not to do that. This information is not intended to replace advice given to you by your health care provider. Make sure you discuss any questions you have  with your health care provider. Document Revised: 12/10/2020 Document Reviewed: 11/20/2019 Elsevier Patient Education  2024 Elsevier Inc.   Cardiac Nuclear Scan A cardiac nuclear scan is a test that is done to check the flow of blood to your heart. It is done when you are resting and when you are exercising. The test looks for problems such as: Not enough blood reaching a portion of the heart. The heart muscle not working as it should. You may need this test if you have: Heart disease. Lab results that are not normal. Had heart surgery or a balloon procedure to open up blocked arteries (angioplasty) or a small mesh tube (stent). Chest pain. Shortness of breath. Had a heart attack. In this  test, a special dye (tracer) is put into your bloodstream. The tracer will travel to your heart. A camera will then take pictures of your heart to see how the tracer moves through your heart. This test is usually done at a hospital and takes 2-4 hours. Tell a doctor about: Any allergies you have. All medicines you are taking, including vitamins, herbs, eye drops, creams, and over-the-counter medicines. Any bleeding problems you have. Any surgeries you have had. Any medical conditions you have. Whether you are pregnant or may be pregnant. Any history of asthma or long-term (chronic) lung disease. Any history of heart rhythm disorders or heart valve conditions. What are the risks? Your doctor will talk with you about risks. These may include: Serious chest pain and heart attack. This is only a risk if the stress portion of the test is done. Fast or uneven heartbeats (palpitations). A feeling of warmth in your chest. This feeling usually does not last long. Allergic reaction to the tracer. Shortness of breath or trouble breathing. What happens before the test? Ask your doctor about changing or stopping your normal medicines. Follow instructions from your doctor about what you cannot eat or drink. Remove your jewelry on the day of the test. Ask your doctor if you need to avoid nicotine or caffeine. What happens during the test? An IV tube will be inserted into one of your veins. Your doctor will give you a small amount of tracer through the IV tube. You will wait for 20-40 minutes while the tracer moves through your bloodstream. Your heart will be monitored with an electrocardiogram (ECG). You will lie down on an exam table. Pictures of your heart will be taken for about 15-20 minutes. You may also have a stress test. For this test, one of these things may be done: You will be asked to exercise on a treadmill or a stationary bike. You will be given medicines that will make your heart work  harder. This is done if you are unable to exercise. When blood flow to your heart has peaked, a tracer will again be given through the IV tube. After 20-40 minutes, you will get back on the exam table. More pictures will be taken of your heart. Depending on the tracer that is used, more pictures may need to be taken 3-4 hours later. Your IV tube will be removed when the test is over. The test may vary among doctors and hospitals. What happens after the test? Ask your doctor: Whether you can return to your normal schedule, including diet, activities, travel, and medicines. Whether you should drink more fluids. This will help to remove the tracer from your body. Ask your doctor, or the department that is doing the test: When will my results be ready? How  will I get my results? What are my treatment options? What other tests do I need? What are my next steps? This information is not intended to replace advice given to you by your health care provider. Make sure you discuss any questions you have with your health care provider. Document Revised: 08/25/2021 Document Reviewed: 08/25/2021 Elsevier Patient Education  2024 Elsevier Inc.    Signed, Debbe Odea, MD  10/21/2022 3:08 PM    [facility]

## 2022-10-21 NOTE — Patient Instructions (Addendum)
Medication Instructions:  - Your physician has recommended you make the following change in your medication:   1) START Aspirin 81 mg: - take 1 tablet by mouth once daily   *If you need a refill on your cardiac medications before your next appointment, please call your pharmacy*   Lab Work: - none ordered  If you have labs (blood work) drawn today and your tests are completely normal, you will receive your results only by: MyChart Message (if you have MyChart) OR A paper copy in the mail If you have any lab test that is abnormal or we need to change your treatment, we will call you to review the results.   Testing/Procedures:  1) Echocardiogram: - Your physician has requested that you have an echocardiogram. Echocardiography is a painless test that uses sound waves to create images of your heart. It provides your doctor with information about the size and shape of your heart and how well your heart's chambers and valves are working. This procedure takes approximately one hour. There are no restrictions for this procedure. There is a possibility that an IV may need to be started during your test to inject an image enhancing agent. This is done to obtain more optimal pictures of your heart. Therefore we ask that you do at least drink some water prior to coming in to hydrate your veins.    Please do NOT wear cologne, perfume, aftershave, or lotions (deodorant is allowed). Please arrive 15 minutes prior to your appointment time.   2) Lexiscan Myoview (Chemical Stress Test/ Cardiac Nuclear Scan): - Your physician has requested that you have a lexiscan myoview.   [facility] MYOVIEW  Your Provider has ordered a Stress Test with nuclear imaging. The purpose of this test is to evaluate the blood supply to your heart muscle. This procedure is referred to as a "Non-Invasive Stress Test." This is because other than having an IV started in your vein, nothing is inserted or "invades" your body. Cardiac  stress tests are done to find areas of poor blood flow to the heart by determining the extent of coronary artery disease (CAD). Some patients exercise on a treadmill, which naturally increases the blood flow to your heart, while others who are unable to walk on a treadmill due to physical limitations have a pharmacologic/chemical stress agent called Lexiscan. This medicine will mimic walking on a treadmill by temporarily increasing your coronary blood flow.     REPORT TO [facility] MEDICAL MALL ENTRANCE  **Proceed to the 1st desk on the right, REGISTRATION, to check in**  Please note: this test may take anywhere between 2-4 hours to complete    Instructions regarding medication:   _xx__:   You may take all of your regular morning medications the day of your test unless listed below.    _xx___:  Hold other medications as follows: Hold all oral diabetic medications the morning of your test. You may take these once your test is completed.     How to prepare for your Myoview test:  Do not eat or drink for 6 hours prior to the test No caffeine for 24 hours prior to the test No smoking 24 hours prior to the test. Ladies, please do not wear dresses.  Skirts or pants are appropriate. Please wear a short sleeve shirt. No perfume, cologne or lotion. Wear comfortable walking shoes. No heels!   PLEASE NOTIFY THE OFFICE AT LEAST 24 HOURS IN ADVANCE IF YOU ARE UNABLE TO KEEP YOUR APPOINTMENT.  [phone] AND  PLEASE NOTIFY NUCLEAR MEDICINE AT [facility] AT LEAST 24 HOURS IN ADVANCE IF YOU ARE UNABLE TO KEEP YOUR APPOINTMENT. [phone]      Follow-Up: At [facility], you and your health needs are our priority.  As part of our continuing mission to provide you with exceptional heart care, we have created designated Provider Care Teams.  These Care Teams include your primary Cardiologist (physician) and Advanced Practice Providers (APPs -  Physician Assistants and Nurse Practitioners) who all  work together to provide you with the care you need, when you need it.  We recommend signing up for the patient portal called "MyChart".  Sign up information is provided on this After Visit Summary.  MyChart is used to connect with patients for Virtual Visits (Telemedicine).  Patients are able to view lab/test results, encounter notes, upcoming appointments, etc.  Non-urgent messages can be sent to your provider as well.   To learn more about what you can do with MyChart, go to [email or web address].    Your next appointment:   2 month(s)  Provider:   You may see Debbe Odea, MD or one of the following Advanced Practice Providers on your designated Care Team:   Nicolasa Ducking, NP Eula Listen, PA-C Cadence Fransico Michael, PA-C Charlsie Quest, NP    Other Instructions Echocardiogram An echocardiogram is a test that uses sound waves (ultrasound) to produce images of the heart. Images from an echocardiogram can provide important information about: Heart size and shape. The size and thickness and movement of your heart's walls. Heart muscle function and strength. Heart valve function or if you have stenosis. Stenosis is when the heart valves are too narrow. If blood is flowing backward through the heart valves (regurgitation). A tumor or infectious growth around the heart valves. Areas of heart muscle that are not working well because of poor blood flow or injury from a heart attack. Aneurysm detection. An aneurysm is a weak or damaged part of an artery wall. The wall bulges out from the normal force of blood pumping through the body. Tell a health care provider about: Any allergies you have. All medicines you are taking, including vitamins, herbs, eye drops, creams, and over-the-counter medicines. Any blood disorders you have. Any surgeries you have had. Any medical conditions you have. Whether you are pregnant or may be pregnant. What are the risks? Generally, this is a safe test.  However, problems may occur, including an allergic reaction to dye (contrast) that may be used during the test. What happens before the test? No specific preparation is needed. You may eat and drink normally. What happens during the test?  You will take off your clothes from the waist up and put on a hospital gown. Electrodes or electrocardiogram (ECG)patches may be placed on your chest. The electrodes or patches are then connected to a device that monitors your heart rate and rhythm. You will lie down on a table for an ultrasound exam. A gel will be applied to your chest to help sound waves pass through your skin. A handheld device, called a transducer, will be pressed against your chest and moved over your heart. The transducer produces sound waves that travel to your heart and bounce back (or "echo" back) to the transducer. These sound waves will be captured in real-time and changed into images of your heart that can be viewed on a video monitor. The images will be recorded on a computer and reviewed by your health care provider. You may be  asked to change positions or hold your breath for a short time. This makes it easier to get different views or better views of your heart. In some cases, you may receive contrast through an IV in one of your veins. This can improve the quality of the pictures from your heart. The procedure may vary among health care providers and hospitals. What can I expect after the test? You may return to your normal, everyday life, including diet, activities, and medicines, unless your health care provider tells you not to do that. Follow these instructions at home: It is up to you to get the results of your test. Ask your health care provider, or the department that is doing the test, when your results will be ready. Keep all follow-up visits. This is important. Summary An echocardiogram is a test that uses sound waves (ultrasound) to produce images of the heart. Images  from an echocardiogram can provide important information about the size and shape of your heart, heart muscle function, heart valve function, and other possible heart problems. You do not need to do anything to prepare before this test. You may eat and drink normally. After the echocardiogram is completed, you may return to your normal, everyday life, unless your health care provider tells you not to do that. This information is not intended to replace advice given to you by your health care provider. Make sure you discuss any questions you have with your health care provider. Document Revised: 12/10/2020 Document Reviewed: 11/20/2019 Elsevier Patient Education  2024 Elsevier Inc.   Cardiac Nuclear Scan A cardiac nuclear scan is a test that is done to check the flow of blood to your heart. It is done when you are resting and when you are exercising. The test looks for problems such as: Not enough blood reaching a portion of the heart. The heart muscle not working as it should. You may need this test if you have: Heart disease. Lab results that are not normal. Had heart surgery or a balloon procedure to open up blocked arteries (angioplasty) or a small mesh tube (stent). Chest pain. Shortness of breath. Had a heart attack. In this test, a special dye (tracer) is put into your bloodstream. The tracer will travel to your heart. A camera will then take pictures of your heart to see how the tracer moves through your heart. This test is usually done at a hospital and takes 2-4 hours. Tell a doctor about: Any allergies you have. All medicines you are taking, including vitamins, herbs, eye drops, creams, and over-the-counter medicines. Any bleeding problems you have. Any surgeries you have had. Any medical conditions you have. Whether you are pregnant or may be pregnant. Any history of asthma or long-term (chronic) lung disease. Any history of heart rhythm disorders or heart valve  conditions. What are the risks? Your doctor will talk with you about risks. These may include: Serious chest pain and heart attack. This is only a risk if the stress portion of the test is done. Fast or uneven heartbeats (palpitations). A feeling of warmth in your chest. This feeling usually does not last long. Allergic reaction to the tracer. Shortness of breath or trouble breathing. What happens before the test? Ask your doctor about changing or stopping your normal medicines. Follow instructions from your doctor about what you cannot eat or drink. Remove your jewelry on the day of the test. Ask your doctor if you need to avoid nicotine or caffeine. What happens during the test?  An IV tube will be inserted into one of your veins. Your doctor will give you a small amount of tracer through the IV tube. You will wait for 20-40 minutes while the tracer moves through your bloodstream. Your heart will be monitored with an electrocardiogram (ECG). You will lie down on an exam table. Pictures of your heart will be taken for about 15-20 minutes. You may also have a stress test. For this test, one of these things may be done: You will be asked to exercise on a treadmill or a stationary bike. You will be given medicines that will make your heart work harder. This is done if you are unable to exercise. When blood flow to your heart has peaked, a tracer will again be given through the IV tube. After 20-40 minutes, you will get back on the exam table. More pictures will be taken of your heart. Depending on the tracer that is used, more pictures may need to be taken 3-4 hours later. Your IV tube will be removed when the test is over. The test may vary among doctors and hospitals. What happens after the test? Ask your doctor: Whether you can return to your normal schedule, including diet, activities, travel, and medicines. Whether you should drink more fluids. This will help to remove the tracer  from your body. Ask your doctor, or the department that is doing the test: When will my results be ready? How will I get my results? What are my treatment options? What other tests do I need? What are my next steps? This information is not intended to replace advice given to you by your health care provider. Make sure you discuss any questions you have with your health care provider. Document Revised: 08/25/2021 Document Reviewed: 08/25/2021 Elsevier Patient Education  2024 [employer].

## 2022-10-21 NOTE — Transitions of Care (Post Inpatient/ED Visit) (Signed)
   10/21/2022  Name: Kim Obrien MRN: [id number] DOB: [birth year]  Today's TOC FU Call Status: Today's TOC FU Call Status:: Unsuccessful Call (2nd Attempt) Unsuccessful Call (2nd Attempt) Date: 10/21/22  Attempted to reach the patient regarding the most recent Inpatient/ED visit.  Follow Up Plan: Additional outreach attempts will be made to reach the patient to complete the Transitions of Care (Post Inpatient/ED visit) call.   Abelino Derrick, MHA [facility]  Managed [facility] Social Worker [phone]

## 2022-10-22 ENCOUNTER — Telehealth: Payer: Self-pay

## 2022-10-22 NOTE — Transitions of Care (Post Inpatient/ED Visit) (Signed)
   10/22/2022  Name: Kim Obrien MRN: [id number] DOB: [birth year]  Today's TOC FU Call Status: Today's TOC FU Call Status:: Unsuccessful Call (3rd Attempt) Unsuccessful Call (3rd Attempt) Date: 10/22/22  Attempted to reach the patient regarding the most recent Inpatient/ED visit.  Follow Up Plan: No further outreach attempts will be made at this time. We have been unable to contact the patient.  Abelino Derrick, MHA [facility]  Managed [facility] Social Worker [phone]

## 2022-10-29 ENCOUNTER — Encounter: Payer: Self-pay | Admitting: Family Medicine

## 2022-10-29 ENCOUNTER — Ambulatory Visit (INDEPENDENT_AMBULATORY_CARE_PROVIDER_SITE_OTHER): Payer: Medicaid Other | Admitting: Family Medicine

## 2022-10-29 ENCOUNTER — Telehealth: Payer: Self-pay | Admitting: Family Medicine

## 2022-10-29 VITALS — BP 106/70 | HR 99 | Temp 98.2°F | Resp 12 | Ht 63.0 in | Wt 214.0 lb

## 2022-10-29 DIAGNOSIS — I10 Essential (primary) hypertension: Secondary | ICD-10-CM

## 2022-10-29 DIAGNOSIS — I2510 Atherosclerotic heart disease of native coronary artery without angina pectoris: Secondary | ICD-10-CM

## 2022-10-29 DIAGNOSIS — F3178 Bipolar disorder, in full remission, most recent episode mixed: Secondary | ICD-10-CM | POA: Diagnosis not present

## 2022-10-29 DIAGNOSIS — N1832 Chronic kidney disease, stage 3b: Secondary | ICD-10-CM | POA: Diagnosis not present

## 2022-10-29 DIAGNOSIS — E1122 Type 2 diabetes mellitus with diabetic chronic kidney disease: Secondary | ICD-10-CM

## 2022-10-29 DIAGNOSIS — E1169 Type 2 diabetes mellitus with other specified complication: Secondary | ICD-10-CM

## 2022-10-29 DIAGNOSIS — K760 Fatty (change of) liver, not elsewhere classified: Secondary | ICD-10-CM | POA: Diagnosis not present

## 2022-10-29 DIAGNOSIS — E785 Hyperlipidemia, unspecified: Secondary | ICD-10-CM

## 2022-10-29 LAB — POCT GLYCOSYLATED HEMOGLOBIN (HGB A1C)
Est. average glucose Bld gHb Est-mCnc: 183
Hemoglobin A1C: 8 % — AB (ref 4.0–5.6)

## 2022-10-29 MED ORDER — TIRZEPATIDE 2.5 MG/0.5ML ~~LOC~~ SOAJ: 2.5000 mg | SUBCUTANEOUS | 0 refills | Status: DC

## 2022-10-29 NOTE — Telephone Encounter (Signed)
Covermymeds is requesting prior authorization Key: [id number] Name: Kim Obrien 2.5mg /0.3ml pen injectors

## 2022-10-29 NOTE — Progress Notes (Signed)
Established patient visit   Patient: Kim Obrien   DOB: 25-Oct-1965   57 y.o. Female  MRN: [id number] Visit Date: 10/29/2022  Today's healthcare provider: Mila Merry, MD   Chief Complaint  Patient presents with   Medical Management of Chronic Issues   Subjective    Discussed the use of AI scribe software for clinical note transcription with the patient, who gave verbal consent to proceed.  History of Present Illness   The patient presents for follow up of diabetes. She reports elevated blood glucose levels, consistently over 200. Last HbA1c was 7.4. She noted an increase in appetite and food intake since discontinuation of Ozempic, which had previously caused stomach issues.   He also reports episode of chest pain and shortness of breath approximately two weeks ago, which led to an emergency room visit. She was referred to cardiology and told she has coronary artery calcification and recommended a stress test and echocardiogram which has been scheduled in a couple of weeks.       Medications: Outpatient Medications Prior to Visit  Medication Sig   allopurinol (ZYLOPRIM) 100 MG tablet Take 1 tablet (100 mg total) by mouth daily.   aspirin EC 81 MG tablet Take 1 tablet (81 mg total) by mouth daily. Swallow whole.   clonazePAM (KLONOPIN) 0.5 MG tablet Take 1 tablet (0.5 mg total) by mouth 2 (two) times daily as needed for anxiety.   dicyclomine (BENTYL) 10 MG capsule TAKE 1 CAPSULE (10 MG TOTAL) BY MOUTH 4 TIMES A DAY BEFORE MEALS AND AT BEDTIME   FARXIGA 10 MG TABS tablet Take 10 mg by mouth daily.   glipiZIDE (GLUCOTROL XL) 5 MG 24 hr tablet Take 5 mg by mouth in the morning and at bedtime.   losartan (COZAAR) 25 MG tablet Take 25 mg by mouth at bedtime.   lovastatin (MEVACOR) 20 MG tablet TAKE 1 TABLET BY MOUTH EVERY DAY (Patient taking differently: Take 20 mg by mouth daily.)   metFORMIN (GLUCOPHAGE) 500 MG tablet Take 1 tablet (500 mg total) by mouth every evening.  Please schedule office visit before any future refill.   omeprazole (PRILOSEC) 20 MG capsule TAKE 1 CAPSULE BY MOUTH EVERY DAY   ondansetron (ZOFRAN) 4 MG tablet TAKE 1 TABLET BY MOUTH EVERY 8 HOURS AS NEEDED FOR NAUSEA AND VOMITING   QUEtiapine (SEROQUEL) 200 MG tablet Take 1 tablet (200 mg total) by mouth 2 (two) times daily.   sodium bicarbonate 650 MG tablet Take 2 tablets (1,300 mg total) by mouth 2 (two) times daily.   spironolactone (ALDACTONE) 50 MG tablet Take 1 tablet (50 mg total) by mouth 2 (two) times daily. (Patient taking differently: Take 50 mg by mouth daily.)   [DISCONTINUED] saxagliptin HCl (ONGLYZA) 5 MG TABS tablet Take 1 tablet (5 mg total) by mouth daily. TAKE IN PLACE OF JANUVIA   [DISCONTINUED] Calcium Carbonate Antacid (TUMS PO) Take 2 tablets by mouth as needed.   [DISCONTINUED] glipiZIDE (GLUCOTROL XL) 2.5 MG 24 hr tablet Take 1 tablet (2.5 mg total) by mouth daily with breakfast. (Patient not taking: Reported on 10/21/2022)   [DISCONTINUED] metoCLOPramide (REGLAN) 5 MG tablet Take 5 mg by mouth 4 (four) times daily -  before meals and at bedtime. (Patient not taking: Reported on 10/21/2022)   [DISCONTINUED] naproxen (NAPROSYN) 500 MG tablet Take 1 tablet (500 mg total) by mouth 2 (two) times daily with a meal.   No facility-administered medications prior to visit.  Objective    BP 106/70 (BP Location: Left Arm, Patient Position: Sitting, Cuff Size: Large)   Pulse 99   Temp 98.2 F (36.8 C) (Temporal)   Resp 12   Ht 5\' 3"  (1.6 m)   Wt 214 lb (97.1 kg)   LMP  (LMP Unknown) Comment: postmenapausal  SpO2 98%   BMI 37.91 kg/m   Physical Exam  General appearance: Obese female, cooperative and in no acute distress Head: Normocephalic, without obvious abnormality, atraumatic Respiratory: Respirations even and unlabored, normal respiratory rate Extremities: All extremities are intact.  Skin: Skin color, texture, turgor normal. No rashes seen  Psych:  Appropriate mood and affect. Neurologic: Mental status: Alert, oriented to person, place, and time, thought content appropriate.     Results for orders placed or performed in visit on 10/29/22  Microalbumin / creatinine urine ratio  Result Value Ref Range   Microalb Creat Ratio 25mg /g   POCT glycosylated hemoglobin (Hb A1C)  Result Value Ref Range   Hemoglobin A1C 8.0 (A) 4.0 - 5.6 %   Est. average glucose Bld gHb Est-mCnc 183     Assessment & Plan     Assessment and Plan    Type 2 Diabetes Mellitus: Elevated blood glucose levels (>200) and recent HbA1c of 7.4. Patient reports increased hunger and food intake since discontinuation of Ozempic which she did not tolerate due to GI effects.  -Discontinue Onglyza (saxagliptin) 5mg . -Initiate Mounjaro, starting with 2.5mg  weekly, titrating up as tolerated.    Coronary Artery Calcification: Recent diagnosis with pending stress test and echocardiogram. Currently on aspirin for management. -Continue aspirin as prescribed by cardiologist. -Complete stress test and echocardiogram as scheduled on 11/11/2022.   Hyperlipidemia: Concern for elevated cholesterol in the setting of coronary artery calcification. -Order fasting lipid panel today. -Consider adjustment of cholesterol management based on results.            Mila Merry, MD  [facility] [phone] (phone) [phone] (fax)  [facility]

## 2022-10-29 NOTE — Patient Instructions (Addendum)
Please review the attached list of medications and notify my office if there are any errors.   Start Mounjaro by taking 2.5mg  once a week for 4 weeks. We'll get in touch with in a month to make sure you are tolerating the medication, and if so, will increase to 5mg  once a week  Stop taking saxagliptin (Onglyza) when you start [facility]

## 2022-10-30 LAB — LIPID PANEL
Chol/HDL Ratio: 4.7 ratio — ABNORMAL HIGH (ref 0.0–4.4)
Cholesterol, Total: 156 mg/dL (ref 100–199)
HDL: 33 mg/dL — ABNORMAL LOW (ref >39)
LDL Chol Calc (NIH): 75 mg/dL (ref 0–99)
Triglycerides: 298 mg/dL — ABNORMAL HIGH (ref 0–149)
VLDL Cholesterol Cal: 48 mg/dL — ABNORMAL HIGH (ref 5–40)

## 2022-10-30 LAB — TSH: TSH: 1.96 u[IU]/mL (ref 0.450–4.500)

## 2022-10-31 ENCOUNTER — Other Ambulatory Visit: Payer: Self-pay | Admitting: Family Medicine

## 2022-10-31 DIAGNOSIS — E1169 Type 2 diabetes mellitus with other specified complication: Secondary | ICD-10-CM

## 2022-10-31 MED ORDER — LOVASTATIN 40 MG PO TABS: 40.0000 mg | ORAL_TABLET | Freq: Every day | ORAL | 3 refills | Status: DC

## 2022-11-03 NOTE — Telephone Encounter (Signed)
Key: [id number] PA Case ID #: [id number] Rx #: [id number] Approved on July 19 Approved. Approved for [facility] Soln Pen-inj 2.5MG /0.5ML, quantity up to 2 per 28 days, under the pharmacy benefit. The drug has been approved from 10/29/2022 to 10/29/2023. Generic or biosimilar substitution may be required when available and preferred on the formulary. Please note that dispensing of non-maintenance and specialty medications may be limited to a monthly supply. Authorization Expiration Date: 10/29/2023

## 2022-11-03 NOTE — Telephone Encounter (Signed)
Pharmacy advised  

## 2022-11-04 ENCOUNTER — Telehealth: Payer: Self-pay | Admitting: Cardiology

## 2022-11-04 NOTE — Telephone Encounter (Signed)
  The patient mentioned that she spoke with her insurance company regarding her stress test. Her insurance informed her that we only need to call them to approve the test over the phone. She provided the phone number [phone].

## 2022-11-04 NOTE — Telephone Encounter (Signed)
I attempted to contact number provided, went through the prompts, but once I attempted to hit a number it hung the phone up. Not sure if a peer to peer is needed, or just approval.   Will route to covering CMA.

## 2022-11-08 NOTE — Telephone Encounter (Signed)
Called Number provided by patient.   Spoke with [place].   She reports there are no Auths pending and she does not see where patients stress test was denied. She stated there was someone that did call in on 11/04/2023.   Told her I was unsure just wanted to follow up based off what the patient stated.   Looks like patient has been cancelled for this test twice.

## 2022-11-09 NOTE — Telephone Encounter (Signed)
Patient is following up. She would like to know if the denial can be appealed and what her next steps should be. Please advise.

## 2022-11-10 NOTE — Telephone Encounter (Signed)
Called patient and reviewed the following from our prior Auth team:  "Insurance comp requested the results from echo to approve. I will fax appeal when the results are ready. "  Patient was thankful for the call.

## 2022-11-14 ENCOUNTER — Encounter: Payer: Self-pay | Admitting: Family Medicine

## 2022-11-15 ENCOUNTER — Encounter: Payer: Self-pay | Admitting: Family Medicine

## 2022-11-15 ENCOUNTER — Ambulatory Visit (INDEPENDENT_AMBULATORY_CARE_PROVIDER_SITE_OTHER): Payer: Medicaid Other | Admitting: Family Medicine

## 2022-11-15 ENCOUNTER — Other Ambulatory Visit: Payer: Self-pay | Admitting: Family Medicine

## 2022-11-15 ENCOUNTER — Other Ambulatory Visit: Payer: Self-pay | Admitting: Cardiology

## 2022-11-15 ENCOUNTER — Ambulatory Visit: Payer: Self-pay | Admitting: *Deleted

## 2022-11-15 VITALS — BP 117/78 | HR 117 | Ht 63.0 in | Wt 210.3 lb

## 2022-11-15 DIAGNOSIS — E119 Type 2 diabetes mellitus without complications: Secondary | ICD-10-CM

## 2022-11-15 DIAGNOSIS — R091 Pleurisy: Secondary | ICD-10-CM

## 2022-11-15 DIAGNOSIS — R0789 Other chest pain: Secondary | ICD-10-CM | POA: Diagnosis not present

## 2022-11-15 DIAGNOSIS — R030 Elevated blood-pressure reading, without diagnosis of hypertension: Secondary | ICD-10-CM

## 2022-11-15 DIAGNOSIS — Z6838 Body mass index (BMI) 38.0-38.9, adult: Secondary | ICD-10-CM

## 2022-11-15 DIAGNOSIS — K589 Irritable bowel syndrome without diarrhea: Secondary | ICD-10-CM

## 2022-11-15 DIAGNOSIS — I2510 Atherosclerotic heart disease of native coronary artery without angina pectoris: Secondary | ICD-10-CM

## 2022-11-15 DIAGNOSIS — R072 Precordial pain: Secondary | ICD-10-CM

## 2022-11-15 DIAGNOSIS — N1832 Chronic kidney disease, stage 3b: Secondary | ICD-10-CM

## 2022-11-15 DIAGNOSIS — E1022 Type 1 diabetes mellitus with diabetic chronic kidney disease: Secondary | ICD-10-CM

## 2022-11-15 MED ORDER — PREDNISONE 10 MG PO TABS: ORAL_TABLET | ORAL | 0 refills | Status: DC

## 2022-11-15 NOTE — Telephone Encounter (Signed)
  Chief Complaint: Upper chest pain Symptoms: Chest pain "Entire upper chest" with deep breaths. 2/10. 7/10 Friday. States tylenol helps relieve pain.  Seen in ED 10/18/22 "Inflammation" States all cardiac neg. Saw cardiologist, "EKG normal." Frequency: Friday. Pertinent Negatives: Patient denies nausea, sweating, palpitations,SOB, cough, fever; pain does not radiate Disposition: [] ED /[] Urgent Care (no appt availability in office) / [x] Appointment(In office/virtual)/ []  [facility]/ [] Home Care/ [] Refused Recommended Disposition /[] [place] [facility]/ []  Follow-up with PCP Additional Notes: Secured appt for this AM. Care advise provided, pt verbalizes understanding.  Reason for Disposition  [1] Chest pain(s) lasting a few seconds AND [2] persists > 3 days  [1] Chest pain lasts < 5 minutes AND [2] NO chest pain or cardiac symptoms (e.g., breathing difficulty, sweating) now  (Exception: Chest pains that last only a few seconds.)    Same CP seen July 8th, "Inflammation."  Answer Assessment - Initial Assessment Questions 1. LOCATION: "Where does it hurt?"       Across entire upper chest 2. RADIATION: "Does the pain go anywhere else?" (e.g., into neck, jaw, arms, back)     No 3. ONSET: "When did the chest pain begin?" (Minutes, hours or days)      friday 4. PATTERN: "Does the pain come and go, or has it been constant since it started?"  "Does it get worse with exertion?"      With deep breathing only, "Same as July 8th" 5. DURATION: "How long does it last" (e.g., seconds, minutes, hours)     NA 6. SEVERITY: "How bad is the pain?"  (e.g., Scale 1-10; mild, moderate, or severe)    - MILD (1-3): doesn't interfere with normal activities     - MODERATE (4-7): interferes with normal activities or awakens from sleep    - SEVERE (8-10): excruciating pain, unable to do any normal activities       Fri 7/10     today 2/10 7. CARDIAC RISK FACTORS: "Do you have any history of heart problems  or risk factors for heart disease?" (e.g., angina, prior heart attack; diabetes, high blood pressure, high cholesterol, smoker, or strong family history of heart disease)     CAD 8. PULMONARY RISK FACTORS: "Do you have any history of lung disease?"  (e.g., blood clots in lung, asthma, emphysema, birth control pills)     No 9. CAUSE: "What do you think is causing the chest pain?"     "Inflammation" 10. OTHER SYMPTOMS: "Do you have any other symptoms?" (e.g., dizziness, nausea, vomiting, sweating, fever, difficulty breathing, cough)       Mild headache  Protocols used: Chest Pain-A-AH

## 2022-11-15 NOTE — Progress Notes (Signed)
Established patient visit   Patient: Kim Obrien   DOB: 01/20/1966   57 y.o. Female  MRN: [id number] Visit Date: 11/15/2022  Today's healthcare provider: Mila Merry, MD   Chief Complaint  Patient presents with   Chest Pain    Patient reports pain across entire upper chest and does not radiate that began Friday. She reports sharp pain with deep breathing only. Her pain was a 7/10 on Friday but today a 2/10. Patient has a hx of CAD with no pulmonary risk factors and believes the pain may be due to inflammation. Pain associated with a mild headache.    Subjective    Discussed the use of AI scribe software for clinical note transcription with the patient, who gave verbal consent to proceed.  History of Present Illness   The patient, with a history of chronic kidney disease, presents with recurrent episodes of chest pain. The first episode occurred on July 8th, for which she sought emergency care. Investigations at that time, including a CT scan, X-ray, and EKG, and cardic enzymes which were all normal and pain was attributed to inflammation.. The patient was given a pain shot and prescribed naproxen, which provided relief. However, due to her kidney disease, her nephrologist advised against naproxen and recommended Tylenol for pain management.  The most recent episode of chest pain began on a Friday (three nights ago) and persisted through the weekend. The pain was described as sharp, constant, and exacerbated by deep breathing. The patient noted relief when lying down. She also reported a slight fever on the Friday night of the episode. By the time of this consultation, the pain had lessened significantly.  The patient denied any cough, history of asthma or lung problems, and any external chest soreness. The patient had not started any new medications and was managing the pain with Tylenol.       Medications: Outpatient Medications Prior to Visit  Medication Sig   allopurinol  (ZYLOPRIM) 100 MG tablet Take 1 tablet (100 mg total) by mouth daily.   aspirin EC 81 MG tablet Take 1 tablet (81 mg total) by mouth daily. Swallow whole.   clonazePAM (KLONOPIN) 0.5 MG tablet Take 1 tablet (0.5 mg total) by mouth 2 (two) times daily as needed for anxiety.   dicyclomine (BENTYL) 10 MG capsule TAKE 1 CAPSULE (10 MG TOTAL) BY MOUTH 4 TIMES A DAY BEFORE MEALS AND AT BEDTIME   FARXIGA 10 MG TABS tablet Take 10 mg by mouth daily.   glipiZIDE (GLUCOTROL XL) 5 MG 24 hr tablet Take 5 mg by mouth in the morning and at bedtime.   losartan (COZAAR) 25 MG tablet Take 25 mg by mouth at bedtime.   lovastatin (MEVACOR) 40 MG tablet Take 1 tablet (40 mg total) by mouth daily.   metFORMIN (GLUCOPHAGE) 500 MG tablet Take 1 tablet (500 mg total) by mouth every evening. Please schedule office visit before any future refill.   omeprazole (PRILOSEC) 20 MG capsule TAKE 1 CAPSULE BY MOUTH EVERY DAY   ondansetron (ZOFRAN) 4 MG tablet TAKE 1 TABLET BY MOUTH EVERY 8 HOURS AS NEEDED FOR NAUSEA AND VOMITING   QUEtiapine (SEROQUEL) 200 MG tablet Take 1 tablet (200 mg total) by mouth 2 (two) times daily.   sodium bicarbonate 650 MG tablet Take 2 tablets (1,300 mg total) by mouth 2 (two) times daily.   spironolactone (ALDACTONE) 50 MG tablet Take 1 tablet (50 mg total) by mouth 2 (two) times daily. (Patient  taking differently: Take 50 mg by mouth daily.)   tirzepatide (MOUNJARO) 2.5 MG/0.5ML Pen Inject 2.5 mg into the skin once a week.   No facility-administered medications prior to visit.   Review of Systems     Objective    BP 117/78 (BP Location: Left Arm, Patient Position: Sitting, Cuff Size: Normal)   Pulse (!) 117   Ht 5\' 3"  (1.6 m)   Wt 210 lb 4.8 oz (95.4 kg)   LMP  (LMP Unknown) Comment: postmenapausal  SpO2 97%   BMI 37.25 kg/m   Physical Exam  Physical Exam   CHEST: Lungs clear to auscultation.    No results found for any visits on 11/15/22.  Assessment & Plan     Assessment and  Plan    Chest Pain Recurrent episodes of chest pain, likely costochondritis or pleurisy based on history and physical exam. Pain is worse with deep breaths and movement, better with rest. No associated cough or respiratory symptoms. Prior episode treated with Naproxen, but discontinued due to renal concerns. -Start Prednisone taper, beginning with 40mg  and decreasing daily until maintenance dose of 10mg . -Advise patient to monitor for potential side effects including hyperglycemia, nervousness, and sleep disturbances. -Check blood sugar levels regularly during Prednisone treatment due to potential for hyperglycemia.  Cardiac Health Scheduled for echocardiogram per cardiology on August 13th due to prior episode of chest pain. -Continue with scheduled echocardiogram.  Renal Health History of renal concerns, limiting use of certain medications such as Naproxen. -Continue current renal protective measures.  Follow-up Monitor response to Prednisone treatment and results of upcoming echocardiogram.     No follow-ups on file.      Mila Merry, MD  [facility] [phone] (phone) [phone] (fax)  [facility]

## 2022-11-16 NOTE — Telephone Encounter (Signed)
Seen in office on 11-15-22

## 2022-11-17 ENCOUNTER — Other Ambulatory Visit: Payer: Self-pay | Admitting: Family Medicine

## 2022-11-17 DIAGNOSIS — N1832 Chronic kidney disease, stage 3b: Secondary | ICD-10-CM

## 2022-11-18 ENCOUNTER — Other Ambulatory Visit: Payer: Disability Insurance

## 2022-11-18 NOTE — Telephone Encounter (Signed)
Requested medication (s) are due for refill today: alternative medication requested  Requested medication (s) are on the active medication list: yes  Last refill:  11/17/22  Future visit scheduled: no  Notes to clinic:  Pharmacy comment: Alternative Requested:PA SENT.      Requested Prescriptions  Pending Prescriptions Disp Refills   MOUNJARO 5 MG/0.5ML Pen [Pharmacy Med Name: MOUNJARO 5 MG/0.5 ML PEN]  1    Sig: INJECT 5 MG SUBCUTANEOUSLY WEEKLY     Off-Protocol Failed - 11/17/2022  1:21 PM      Failed - Medication not assigned to a protocol, review manually.      Passed - Valid encounter within last 12 months    Recent Outpatient Visits           3 days ago Other chest pain   [place] [facility] Malva Limes, MD   2 weeks ago Type 2 diabetes mellitus with stage 3b chronic kidney disease, without long-term current use of insulin (HCC)   [place] [facility] Malva Limes, MD   4 months ago Type 2 diabetes mellitus with stage 3b chronic kidney disease, without long-term current use of insulin (HCC)   [place] [facility] Malva Limes, MD   5 months ago Hyperglycemia   [place] [facility] Malva Limes, MD   5 months ago Type 2 diabetes mellitus with stage 3b chronic kidney disease, without long-term current use of insulin (HCC)   [place] [facility] Malva Limes, MD       Future Appointments             In 1 month Agbor-Etang, Arlys John, MD [facility]

## 2022-11-22 ENCOUNTER — Telehealth: Payer: Self-pay | Admitting: Family Medicine

## 2022-11-22 NOTE — Telephone Encounter (Signed)
PA initiated

## 2022-11-22 NOTE — Telephone Encounter (Signed)
Covermymeds is requesting prior authorization Key: [id number] Name: Kim Obrien 5mg /0.5ML pen injectors

## 2022-11-22 NOTE — Telephone Encounter (Signed)
Outcome Approved today by [facility] MCD 2017 Approved. Approved for MOUNJARO Soln Pen-inj 5MG /0.5ML, quantity up to 2 per 28 days, under the pharmacy benefit. The drug has been approved from 11/22/2022 to 11/22/2023. Generic or biosimilar substitution may be required when available and preferred on the formulary. Please note that dispensing of non-maintenance and specialty medications may be limited to a monthly supply. Authorization Expiration Date: 11/22/2023 Drug Mounjaro 5MG /0.5ML pen-injectors

## 2022-11-23 ENCOUNTER — Ambulatory Visit: Payer: Medicaid Other | Attending: Cardiology

## 2022-11-23 DIAGNOSIS — R072 Precordial pain: Secondary | ICD-10-CM

## 2022-11-23 LAB — ECHOCARDIOGRAM COMPLETE: Area-P 1/2: 4.6 cm2

## 2022-11-23 MED ORDER — PERFLUTREN LIPID MICROSPHERE
1.0000 mL | INTRAVENOUS | Status: AC | PRN
Start: 2022-11-23 — End: 2022-11-23
  Administered 2022-11-23: 2 mL via INTRAVENOUS

## 2022-12-23 ENCOUNTER — Ambulatory Visit: Payer: Medicaid Other | Attending: Cardiology | Admitting: Cardiology

## 2022-12-23 ENCOUNTER — Encounter: Payer: Self-pay | Admitting: Cardiology

## 2022-12-23 VITALS — BP 102/70 | HR 108 | Ht 63.0 in | Wt 213.2 lb

## 2022-12-23 DIAGNOSIS — I2510 Atherosclerotic heart disease of native coronary artery without angina pectoris: Secondary | ICD-10-CM | POA: Diagnosis not present

## 2022-12-23 DIAGNOSIS — R072 Precordial pain: Secondary | ICD-10-CM | POA: Diagnosis not present

## 2022-12-23 DIAGNOSIS — I2584 Coronary atherosclerosis due to calcified coronary lesion: Secondary | ICD-10-CM

## 2022-12-23 DIAGNOSIS — Z6837 Body mass index (BMI) 37.0-37.9, adult: Secondary | ICD-10-CM

## 2022-12-23 NOTE — Progress Notes (Signed)
Cardiology Office Note:    Date:  12/23/2022   ID:  Kim Obrien, DOB November 15, 1965, MRN [id number]  PCP:  Malva Limes, MD   [facility] Providers Cardiologist:  Debbe Odea, MD     Referring MD: Malva Limes, MD   Chief Complaint  Patient presents with   Follow-up    Discuss cardiac testing.  Did not obtain myoview due to insurance denied.      History of Present Illness:    Kim Obrien is a 57 y.o. female with a hx of coronary calcifications, diabetes, CKD 3, presenting for follow-up.  Previously seen due to symptoms of chest pain.  Chest pain appears pleuritic with some musculoskeletal component.  Symptoms resolved after a 10-day course of naproxen.  Due to coronary calcifications, Lexiscan Myoview was ordered, but denied by insurance.  Echocardiogram obtained to evaluate any cardiac dysfunction.  States feeling well, working on eating healthier and losing weight.  Presents for echocardiogram results.  Past Medical History:  Diagnosis Date   Arthritis    Bipolar 1 disorder (HCC)    Diabetes mellitus without complication (HCC)    History of chicken pox     Past Surgical History:  Procedure Laterality Date   ESOPHAGOGASTRODUODENOSCOPY N/A 04/29/2022   Procedure: ESOPHAGOGASTRODUODENOSCOPY (EGD);  Surgeon: Jaynie Collins, DO;  Location: [facility] ENDOSCOPY;  Service: Gastroenterology;  Laterality: N/A;   LAPAROSCOPY Left 11/04/2017   Procedure: operative laparoscopy, aspiration of left ovarian cyst, left salpingo-oopherectomy;  Surgeon: Conard Novak, MD;  Location: [facility] ORS;  Service: Gynecology;  Laterality: Left;   pap smear  2008   in [place]   partial wisdom tooth extraction  2004    Current Medications: Current Meds  Medication Sig   allopurinol (ZYLOPRIM) 100 MG tablet Take 1 tablet (100 mg total) by mouth daily.   aspirin EC 81 MG tablet Take 1 tablet (81 mg total) by mouth daily. Swallow whole.   clonazePAM (KLONOPIN) 0.5 MG  tablet Take 1 tablet (0.5 mg total) by mouth 2 (two) times daily as needed for anxiety.   dicyclomine (BENTYL) 10 MG capsule TAKE 1 CAPSULE (10 MG TOTAL) BY MOUTH 4 TIMES A DAY BEFORE MEALS AND AT BEDTIME   FARXIGA 10 MG TABS tablet Take 10 mg by mouth daily.   glipiZIDE (GLUCOTROL XL) 5 MG 24 hr tablet Take 5 mg by mouth in the morning and at bedtime.   losartan (COZAAR) 25 MG tablet Take 25 mg by mouth at bedtime.   lovastatin (MEVACOR) 40 MG tablet Take 1 tablet (40 mg total) by mouth daily.   metFORMIN (GLUCOPHAGE) 500 MG tablet Take 1 tablet (500 mg total) by mouth every evening.   omeprazole (PRILOSEC) 20 MG capsule TAKE 1 CAPSULE BY MOUTH EVERY DAY   ondansetron (ZOFRAN) 4 MG tablet TAKE 1 TABLET BY MOUTH EVERY 8 HOURS AS NEEDED FOR NAUSEA AND VOMITING   QUEtiapine (SEROQUEL) 200 MG tablet Take 1 tablet (200 mg total) by mouth 2 (two) times daily.   sodium bicarbonate 650 MG tablet TAKE 2 TABLETS (1,300 MG TOTAL) BY MOUTH 2 (TWO) TIMES DAILY.   spironolactone (ALDACTONE) 50 MG tablet Take 1 tablet (50 mg total) by mouth 2 (two) times daily.   tirzepatide (MOUNJARO) 5 MG/0.5ML Pen INJECT 5 MG SUBCUTANEOUSLY WEEKLY     Allergies:   Patient has no known allergies.   Social History   Socioeconomic History   Marital status: Married    Spouse name: Not on file  Number of children: 2   Years of education: Not on file   Highest education level: Associate degree: occupational, technical, or vocational program  Occupational History   Not on file  Tobacco Use   Smoking status: Never   Smokeless tobacco: Never  Vaping Use   Vaping status: Never Used  Substance and Sexual Activity   Alcohol use: No    Alcohol/week: 0.0 standard drinks of alcohol   Drug use: No   Sexual activity: Yes  Other Topics Concern   Not on file  Social History Narrative   Not on file   Social Determinants of Health   Financial Resource Strain: Low Risk  (10/27/2022)   Overall Financial Resource Strain  (CARDIA)    Difficulty of Paying Living Expenses: Not hard at all  Food Insecurity: No Food Insecurity (10/27/2022)   Hunger Vital Sign    Worried About Running Out of Food in the Last Year: Never true    Ran Out of Food in the Last Year: Never true  Transportation Needs: No Transportation Needs (10/27/2022)   PRAPARE - Administrator, Civil Service (Medical): No    Lack of Transportation (Non-Medical): No  Physical Activity: Insufficiently Active (10/27/2022)   Exercise Vital Sign    Days of Exercise per Week: 3 days    Minutes of Exercise per Session: 20 min  Stress: Stress Concern Present (10/27/2022)   [employer] of Occupational Health - Occupational Stress Questionnaire    Feeling of Stress : To some extent  Social Connections: Moderately Isolated (10/27/2022)   Social Connection and Isolation Panel [NHANES]    Frequency of Communication with Friends and Family: More than three times a week    Frequency of Social Gatherings with Friends and Family: Twice a week    Attends Religious Services: More than 4 times per year    Active Member of [employer] or Organizations: No    Attends Engineer, structural: Not on file    Marital Status: Separated     Family History: The patient's family history includes Anxiety disorder in her mother and sister; Bipolar disorder in her father; Depression in her mother; Diabetes in her mother; Gout in her father; Heart disease in her mother; Hypertension in her father; Prostate cancer in her father. There is no history of Breast cancer.  ROS:   Please see the history of present illness.     All other systems reviewed and are negative.  EKGs/Labs/Other Studies Reviewed:    The following studies were reviewed today:       Recent Labs: 10/18/2022: BUN 23; Creatinine, Ser 1.67; Hemoglobin 13.8; Platelets 200; Potassium 4.3; Sodium 135 10/29/2022: TSH 1.960  Recent Lipid Panel    Component Value Date/Time   CHOL 156 10/29/2022  1138   TRIG 298 (H) 10/29/2022 1138   HDL 33 (L) 10/29/2022 1138   CHOLHDL 4.7 (H) 10/29/2022 1138   LDLCALC 75 10/29/2022 1138     Risk Assessment/Calculations:            Physical Exam:    VS:  BP 102/70 (BP Location: Left Arm, Patient Position: Sitting, Cuff Size: Large)   Pulse (!) 108   Ht 5\' 3"  (1.6 m)   Wt 213 lb 3.2 oz (96.7 kg)   LMP  (LMP Unknown) Comment: postmenapausal  SpO2 98%   BMI 37.77 kg/m     Wt Readings from Last 3 Encounters:  12/23/22 213 lb 3.2 oz (96.7 kg)  11/15/22 210 lb  4.8 oz (95.4 kg)  10/29/22 214 lb (97.1 kg)     GEN:  Well nourished, well developed in no acute distress HEENT: Normal NECK: No JVD; No carotid bruits CARDIAC: RRR, no murmurs, rubs, gallops RESPIRATORY:  Clear to auscultation without rales, wheezing or rhonchi  ABDOMEN: Soft, non-tender, non-distended MUSCULOSKELETAL:  No edema; chest tenderness with palpation. SKIN: Warm and dry NEUROLOGIC:  Alert and oriented x 3 PSYCHIATRIC:  Normal affect   ASSESSMENT:    1. Precordial pain   2. Coronary artery calcification   3. BMI 37.0-37.9, adult    PLAN:    In order of problems listed above:  Chest pain, echocardiogram with normal EF 60 to 65%, no evidence of pericardial effusion.  No findings to suggest etiology of chest pain.  Pain appears musculoskeletal, currently resolved after taking NSAIDs.   Coronary calcifications, continue aspirin 81 mg, lovastatin 20 mg daily. Obesity, low-calorie diet.  Follow-up in 1 year.         Medication Adjustments/Labs and Tests Ordered: Current medicines are reviewed at length with the patient today.  Concerns regarding medicines are outlined above.  No orders of the defined types were placed in this encounter.  No orders of the defined types were placed in this encounter.   Patient Instructions  Medication Instructions:   Your physician recommends that you continue on your current medications as directed. Please refer to the  Current Medication list given to you today.  *If you need a refill on your cardiac medications before your next appointment, please call your pharmacy*   Lab Work:  None Ordered  If you have labs (blood work) drawn today and your tests are completely normal, you will receive your results only by: MyChart Message (if you have MyChart) OR A paper copy in the mail If you have any lab test that is abnormal or we need to change your treatment, we will call you to review the results.   Testing/Procedures:  None Ordered   Follow-Up: At [facility], you and your health needs are our priority.  As part of our continuing mission to provide you with exceptional heart care, we have created designated Provider Care Teams.  These Care Teams include your primary Cardiologist (physician) and Advanced Practice Providers (APPs -  Physician Assistants and Nurse Practitioners) who all work together to provide you with the care you need, when you need it.  We recommend signing up for the patient portal called "MyChart".  Sign up information is provided on this After Visit Summary.  MyChart is used to connect with patients for Virtual Visits (Telemedicine).  Patients are able to view lab/test results, encounter notes, upcoming appointments, etc.  Non-urgent messages can be sent to your provider as well.   To learn more about what you can do with MyChart, go to [email or web address].    Your next appointment:   12 month(s)  Provider:   You may see Debbe Odea, MD or one of the following Advanced Practice Providers on your designated Care Team:   Nicolasa Ducking, NP Eula Listen, PA-C Cadence Fransico Michael, PA-C Charlsie Quest, NP   Signed, Debbe Odea, MD  12/23/2022 12:48 PM    [facility]

## 2022-12-23 NOTE — Patient Instructions (Signed)

## 2023-01-02 ENCOUNTER — Encounter: Payer: Self-pay | Admitting: Family Medicine

## 2023-01-05 ENCOUNTER — Other Ambulatory Visit: Payer: Self-pay | Admitting: Family Medicine

## 2023-01-05 DIAGNOSIS — E1122 Type 2 diabetes mellitus with diabetic chronic kidney disease: Secondary | ICD-10-CM

## 2023-01-06 NOTE — Telephone Encounter (Signed)
Requested medication (s) are due for refill today: yes  Requested medication (s) are on the active medication list: yes  Last refill:  11/19/22 2 ml 1 RF  Future visit scheduled: no  Notes to clinic:  med not assigned to a protocol   Requested Prescriptions  Pending Prescriptions Disp Refills   MOUNJARO 5 MG/0.5ML Pen [Pharmacy Med Name: MOUNJARO 5 MG/0.5 ML PEN]  1    Sig: INJECT 5 MG SUBCUTANEOUSLY WEEKLY     Off-Protocol Failed - 01/05/2023  8:44 PM      Failed - Medication not assigned to a protocol, review manually.      Passed - Valid encounter within last 12 months    Recent Outpatient Visits           1 month ago Other chest pain   [place] [facility] Malva Limes, MD   2 months ago Type 2 diabetes mellitus with stage 3b chronic kidney disease, without long-term current use of insulin (HCC)   [place] [facility] [facility] Malva Limes, MD   5 months ago Type 2 diabetes mellitus with stage 3b chronic kidney disease, without long-term current use of insulin (HCC)   [place] [facility] Malva Limes, MD   7 months ago Hyperglycemia   [facility] Malva Limes, MD   7 months ago Type 2 diabetes mellitus with stage 3b chronic kidney disease, without long-term current use of insulin (HCC)   [place] [facility] Sherrie Mustache, Demetrios Isaacs, MD

## 2023-01-10 ENCOUNTER — Other Ambulatory Visit: Payer: Self-pay | Admitting: Family Medicine

## 2023-01-10 DIAGNOSIS — Z1231 Encounter for screening mammogram for malignant neoplasm of breast: Secondary | ICD-10-CM

## 2023-01-31 ENCOUNTER — Ambulatory Visit
Admission: RE | Admit: 2023-01-31 | Discharge: 2023-01-31 | Disposition: A | Discharge status: Home / Self Care | Payer: Medicaid Other | Source: Ambulatory Visit | Attending: Family Medicine | Admitting: Family Medicine

## 2023-01-31 DIAGNOSIS — Z1231 Encounter for screening mammogram for malignant neoplasm of breast: Secondary | ICD-10-CM | POA: Diagnosis present

## 2023-02-02 ENCOUNTER — Telehealth: Payer: Self-pay | Admitting: Family Medicine

## 2023-02-02 NOTE — Telephone Encounter (Signed)
Patient wants to know when she is due for another Hbg A1c and does she need an office visit to have that done or just go to the lab?

## 2023-02-03 NOTE — Telephone Encounter (Signed)
She is due in mid November. Please schedule appointment.

## 2023-02-14 IMAGING — CT CT ABD-PELV W/O CM
2 of 4 series · 15 of 46 positions shown, 17 images · non-contrast
Comparison: Noncontrast abdominopelvic CT 11/04/2017

CLINICAL DATA: Abdominal pain, acute nonlocalized. Generalized
abdominal pain. History of diabetes and left salpingo oophorectomy.



[Series 3: abd/ pelvis 5.0 i30f 2 · axial · 0.80mm/px · z∈[-399,+11]mm · 12 of 94 slices shown, 14 images]
[im 8/94  soft-tissue]
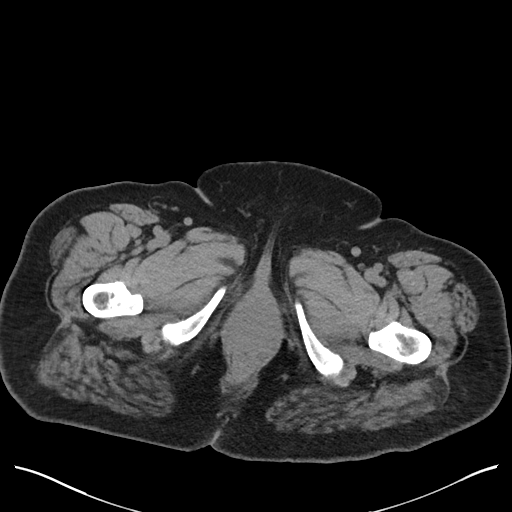
[im 8/94  bone]
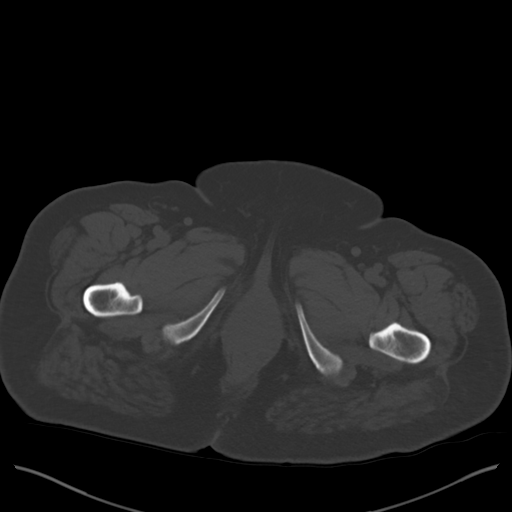
[im 15/94  soft-tissue]
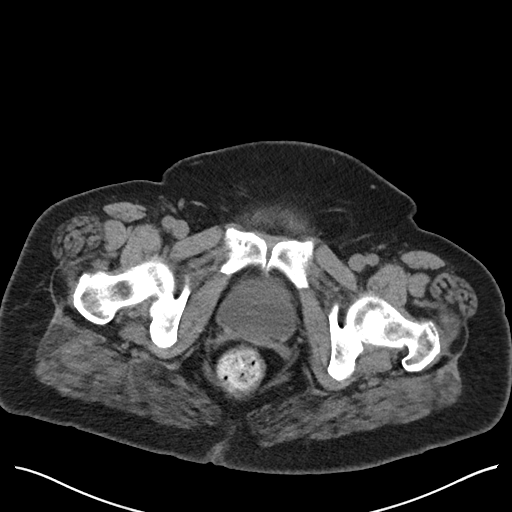
[im 23/94  soft-tissue]
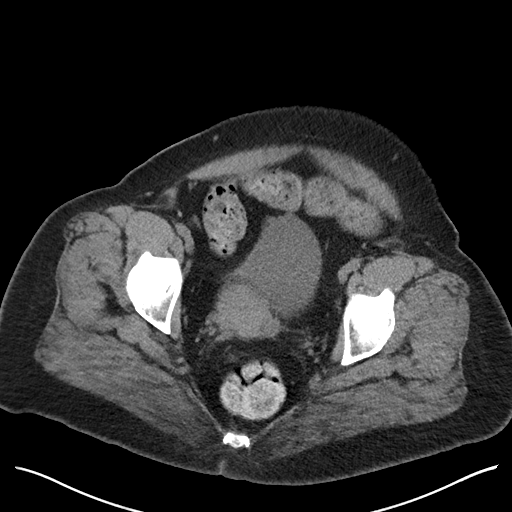
[im 30/94  soft-tissue]
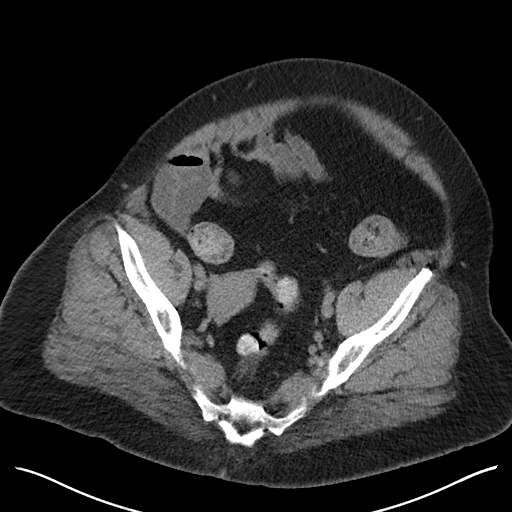
[im 38/94  soft-tissue]
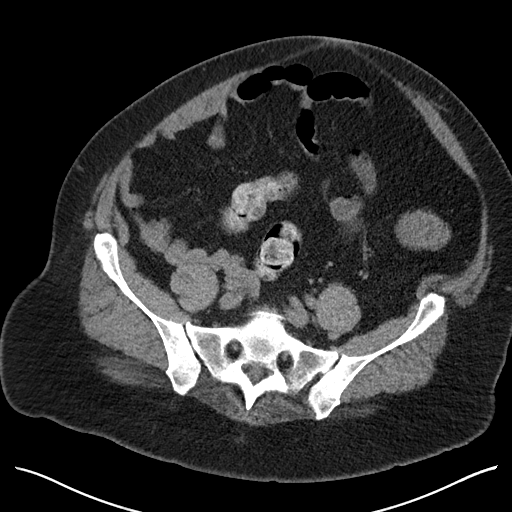
[im 45/94  soft-tissue]
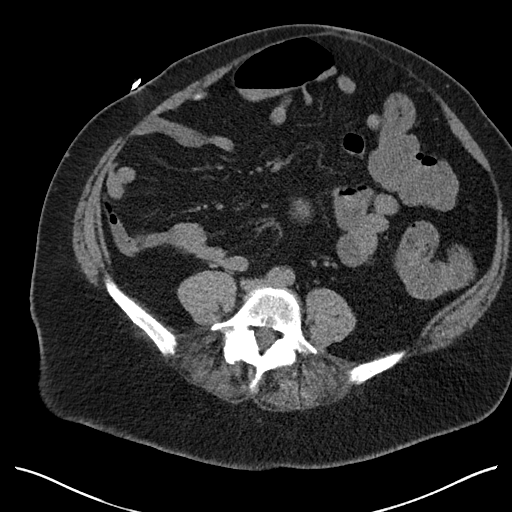
[im 53/94  soft-tissue]
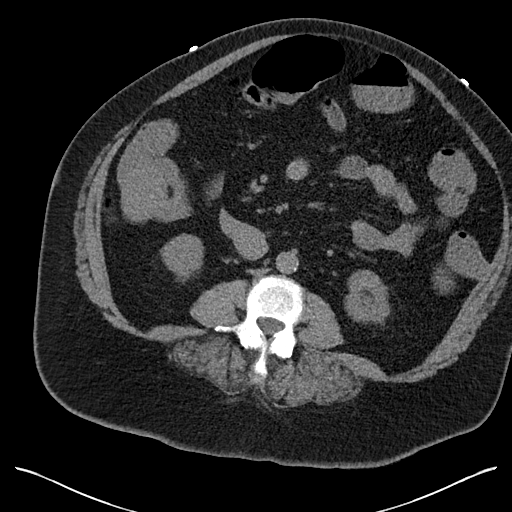
[im 60/94  soft-tissue]
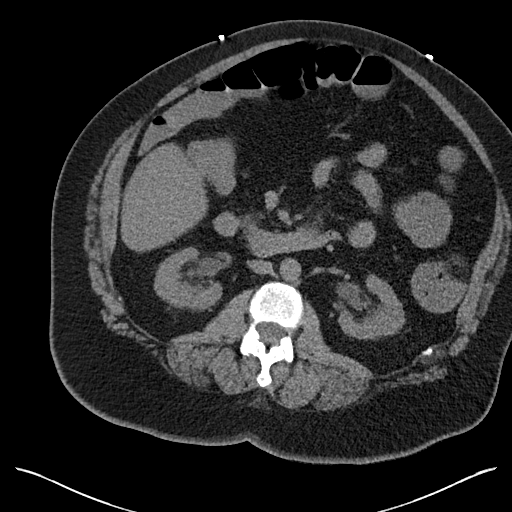
[im 67/94  soft-tissue]
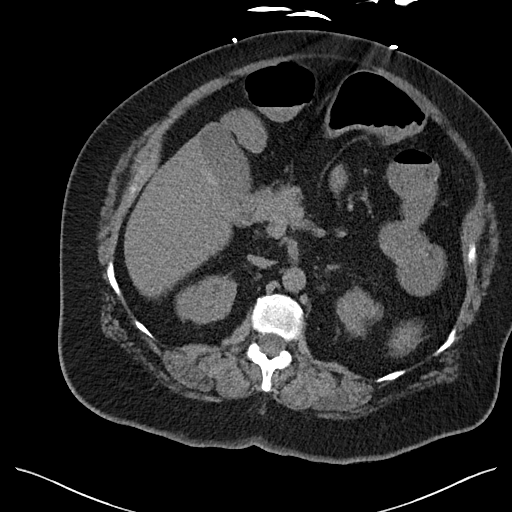
[im 67/94  bone]
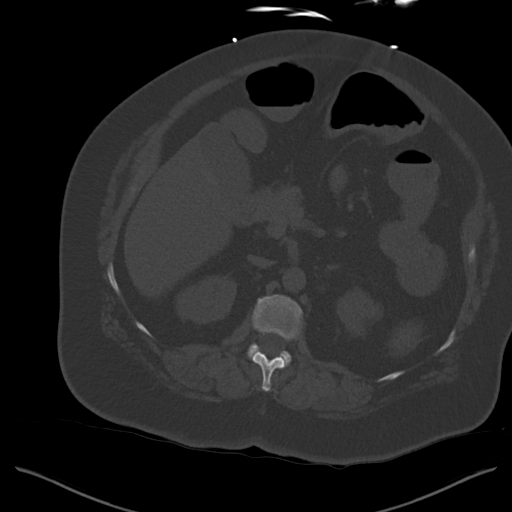
[im 75/94  soft-tissue]
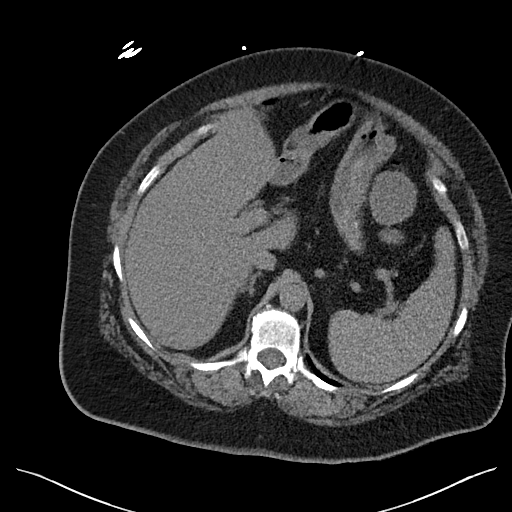
[im 82/94  soft-tissue]
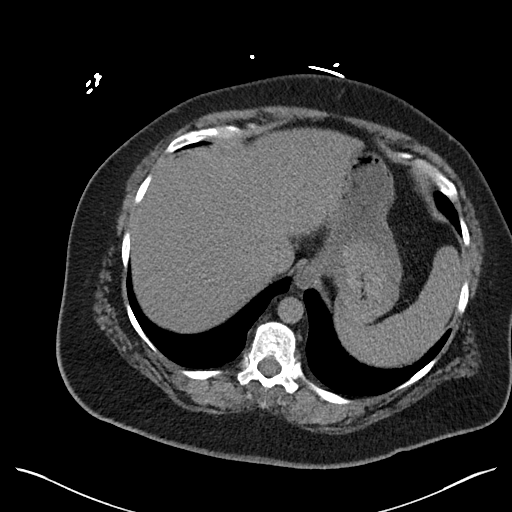
[im 90/94  soft-tissue]
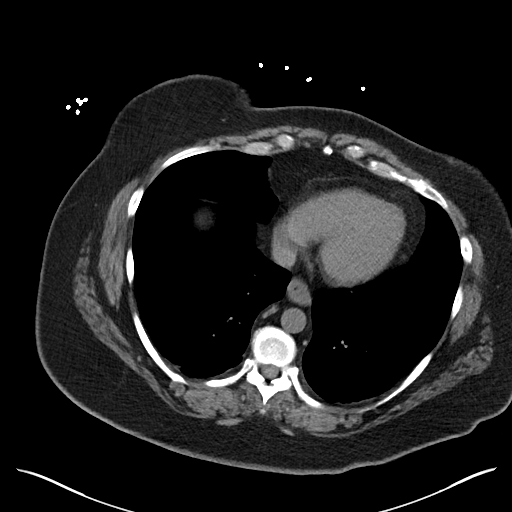

[Series 6: cor st · coronal · 0.78mm/px · 3 of 120 slices shown]
[im 40/120  soft-tissue]
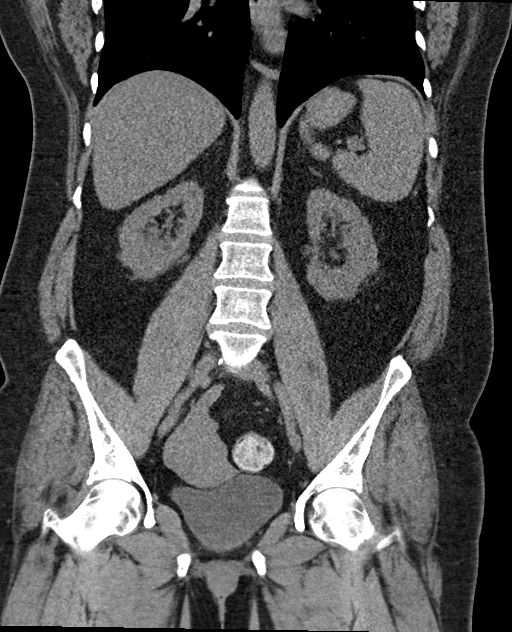
[im 53/120  soft-tissue]
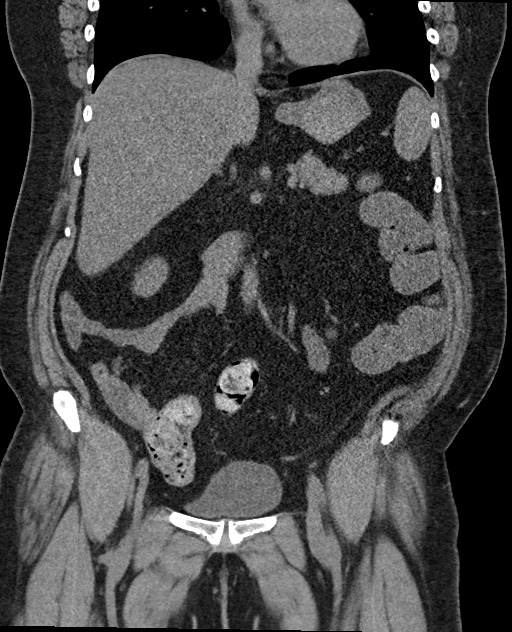
[im 67/120  soft-tissue]
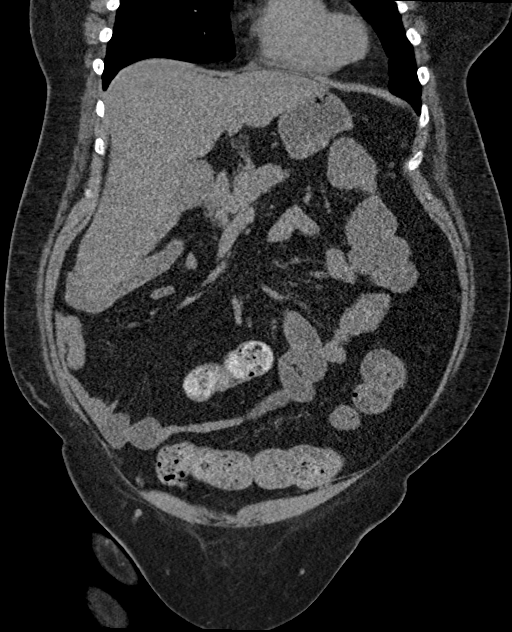

[15 of 46 positions shown; findings below may reference images not displayed]

FINDINGS: Lower chest: Clear lung bases. No significant pleural or pericardial
effusion. Small hiatal hernia.

Hepatobiliary: The liver demonstrates diffusely decreased density
consistent with steatosis. No focal abnormality identified on
noncontrast imaging. No evidence of gallstones, gallbladder wall
thickening or biliary dilatation.

Pancreas: Unremarkable. No pancreatic ductal dilatation or
surrounding inflammatory changes.

Spleen: Normal in size without focal abnormality.

Adrenals/Urinary Tract: Both adrenal glands appear normal. The
kidneys appear normal without evidence of urinary tract calculus,
suspicious lesion or hydronephrosis. Probable pelvic floor laxity.
There is a small amount of gas within the urinary bladder lumen. No
evidence of bladder wall thickening or surrounding inflammation.

Stomach/Bowel: No enteric contrast administered. The stomach appears
unremarkable for its degree of distension. No evidence of bowel wall
thickening, distention or surrounding inflammatory change. There is
prominent high density stool within the distal colon, and the
proximal colon is fluid-filled without significant distension.

Vascular/Lymphatic: There are no enlarged abdominal or pelvic lymph
nodes. Minimal aortic atherosclerosis.

Reproductive: Interval surgical excision of the large pelvic mass
demonstrated on the prior examination. The right ovary and uterus
appear normal. No recurrent mass lesion identified.

Other: Intact abdominal wall.  No ascites or peritoneal nodularity.

Musculoskeletal: No acute or significant osseous findings. Lower
lumbar facet arthropathy.
IMPRESSION: 1. No acute findings or explanation for the patient's symptoms
identified.
2. Interval surgical excision of previously demonstrated large
pelvic mass. No evidence of recurrent pelvic mass or metastatic
disease.
3. Prominent stool in the distal colon, suggesting constipation.
4. Small amount of gas in the bladder lumen, nonspecific and
potentially iatrogenic. Correlate clinically. Underlying pelvic
floor laxity.
5. Hepatic steatosis.
6. Mild Aortic Atherosclerosis ([id number]).

## 2023-02-23 ENCOUNTER — Other Ambulatory Visit: Payer: Self-pay | Admitting: Family Medicine

## 2023-02-23 ENCOUNTER — Encounter: Payer: Self-pay | Admitting: Family Medicine

## 2023-02-23 ENCOUNTER — Telehealth: Payer: Self-pay | Admitting: Family Medicine

## 2023-02-23 ENCOUNTER — Ambulatory Visit: Payer: Medicaid Other | Admitting: Family Medicine

## 2023-02-23 VITALS — BP 112/70 | HR 109 | Resp 16 | Wt 215.8 lb

## 2023-02-23 DIAGNOSIS — N1832 Chronic kidney disease, stage 3b: Secondary | ICD-10-CM

## 2023-02-23 DIAGNOSIS — E1122 Type 2 diabetes mellitus with diabetic chronic kidney disease: Secondary | ICD-10-CM | POA: Diagnosis not present

## 2023-02-23 DIAGNOSIS — Z7984 Long term (current) use of oral hypoglycemic drugs: Secondary | ICD-10-CM | POA: Diagnosis not present

## 2023-02-23 DIAGNOSIS — I10 Essential (primary) hypertension: Secondary | ICD-10-CM

## 2023-02-23 MED ORDER — TIRZEPATIDE 7.5 MG/0.5ML ~~LOC~~ SOAJ
7.5000 mg | SUBCUTANEOUS | 0 refills | Status: DC
Start: 2023-02-23 — End: 2023-06-03

## 2023-02-23 MED ORDER — ONDANSETRON HCL 4 MG PO TABS: 4.0000 mg | ORAL_TABLET | Freq: Three times a day (TID) | ORAL | 1 refills | Status: DC | PRN

## 2023-02-23 NOTE — Progress Notes (Signed)
Established patient visit   Patient: Kim Obrien   DOB: 23-Oct-1965   57 y.o. Female  MRN: [id number] Visit Date: 02/23/2023  Today's healthcare provider: Mila Merry, MD   Chief Complaint  Patient presents with   Medical Management of Chronic Issues   Prediabetes   Subjective    Discussed the use of AI scribe software for clinical note transcription with the patient, who gave verbal consent to proceed.  History of Present Illness   The patient presents es, has been on [facility] for approximately three to four months, initially starting at 2.5mg  and later increasing to 5mg . She reports that the medication was effective initially, with blood sugars decreasing to around 100-110. However, in recent times, her blood sugars have increased to around 160-170. She also reports occasional nausea after taking Mounjaro, which is managed with Zofran. No other gastrointestinal symptoms such as cramping or constipation were reported.  The patient had previously been on Onglyza (saxagliptin) but discontinued it due to GI side effects. She expresses a preference for [facility], stating it works better for her.  The patient also requested a refill of Zofran, which she finds helpful in managing the occasional nausea associated with Mounjaro. She has not had this medication refilled in a while.  The patient does not take flu shots, stating that she usually does not get the flu, and if she does, it is not severe.       Medications: Outpatient Medications Prior to Visit  Medication Sig   allopurinol (ZYLOPRIM) 100 MG tablet Take 1 tablet (100 mg total) by mouth daily.   aspirin EC 81 MG tablet Take 1 tablet (81 mg total) by mouth daily. Swallow whole.   clonazePAM (KLONOPIN) 0.5 MG tablet Take 1 tablet (0.5 mg total) by mouth 2 (two) times daily as needed for anxiety.   dicyclomine (BENTYL) 10 MG capsule TAKE 1 CAPSULE (10 MG TOTAL) BY MOUTH 4 TIMES A DAY BEFORE MEALS AND AT BEDTIME   FARXIGA  10 MG TABS tablet Take 10 mg by mouth daily.   glipiZIDE (GLUCOTROL XL) 5 MG 24 hr tablet Take 5 mg by mouth in the morning and at bedtime.   losartan (COZAAR) 25 MG tablet Take 25 mg by mouth at bedtime.   lovastatin (MEVACOR) 40 MG tablet Take 1 tablet (40 mg total) by mouth daily.   metFORMIN (GLUCOPHAGE) 500 MG tablet Take 1 tablet (500 mg total) by mouth every evening.   omeprazole (PRILOSEC) 20 MG capsule TAKE 1 CAPSULE BY MOUTH EVERY DAY   QUEtiapine (SEROQUEL) 200 MG tablet Take 1 tablet (200 mg total) by mouth 2 (two) times daily.   sodium bicarbonate 650 MG tablet TAKE 2 TABLETS (1,300 MG TOTAL) BY MOUTH 2 (TWO) TIMES DAILY.   spironolactone (ALDACTONE) 50 MG tablet Take 1 tablet (50 mg total) by mouth 2 (two) times daily.   ondansetron (ZOFRAN) 4 MG tablet TAKE 1 TABLET BY MOUTH EVERY 8 HOURS AS NEEDED FOR NAUSEA AND VOMITING   tirzepatide (MOUNJARO) 5 MG/0.5ML Pen INJECT 5 MG SUBCUTANEOUSLY WEEKLY   No facility-administered medications prior to visit.   Review of Systems  Constitutional:  Negative for appetite change, chills, fatigue and fever.  Respiratory:  Negative for chest tightness and shortness of breath.   Cardiovascular:  Negative for chest pain and palpitations.  Gastrointestinal:  Negative for abdominal pain, nausea and vomiting.  Neurological:  Negative for dizziness and weakness.       Objective  BP 112/70 (BP Location: Left Arm, Patient Position: Sitting, Cuff Size: Large)   Pulse (!) 109   Resp 16   Wt 215 lb 12.8 oz (97.9 kg)   LMP  (LMP Unknown) Comment: postmenapausal  SpO2 98%   BMI 38.23 kg/m   Physical Exam  General appearance: Mildly obese female, cooperative and in no acute distress Head: Normocephalic, without obvious abnormality, atraumatic Respiratory: Respirations even and unlabored, normal respiratory rate Extremities: All extremities are intact.  Skin: Skin color, texture, turgor normal. No rashes seen  Psych: Appropriate mood and  affect. Neurologic: Mental status: Alert, oriented to person, place, and time, thought content appropriate.    Assessment & Plan        Type 2 Diabetes Mellitus Blood glucose levels initially improved with Mounjaro, but have since increased. No significant side effects reported. -Increase Mounjaro to 7.5mg . -Continue current oral medications. -Order A1C to assess overall glycemic control. -Consider reducing oral medications depending on A1C results.  Nausea Occasional nausea managed with Zofran. -Refill Zofran prescription.  General Health Maintenance / Followup Plans -Declined influenza vaccine. -Schedule follow-up appointment in February 2025.    Return in about 3 months (around 05/26/2023) for Diabetes.      Mila Merry, MD  [facility] [phone] (phone) [phone] (fax)  [facility]

## 2023-02-23 NOTE — Telephone Encounter (Signed)
Received fax from [employer] for Mounjaro 7.5 mg/0.96ml   Key:  [id number]

## 2023-02-23 NOTE — Patient Instructions (Signed)
.   Please review the attached list of medications and notify my office if there are any errors.   . Please bring all of your medications to every appointment so we can make sure that our medication list is the same as yours.   

## 2023-02-24 LAB — HEMOGLOBIN A1C
Est. average glucose Bld gHb Est-mCnc: 148 mg/dL
Hgb A1c MFr Bld: 6.8 % — ABNORMAL HIGH (ref 4.8–5.6)

## 2023-03-07 ENCOUNTER — Telehealth: Payer: Self-pay | Admitting: Family Medicine

## 2023-03-07 NOTE — Telephone Encounter (Signed)
[employer] is requesting prior authorization Key: [id number] Mounjaro 7.5mg /0.3ml auto injecotrs

## 2023-03-11 ENCOUNTER — Other Ambulatory Visit: Payer: Self-pay | Admitting: Family Medicine

## 2023-03-11 DIAGNOSIS — M109 Gout, unspecified: Secondary | ICD-10-CM

## 2023-03-14 NOTE — Telephone Encounter (Signed)
Requested medication (s) are due for refill today: Yes  Requested medication (s) are on the active medication list: Yes  Last refill:  02/22/22  Future visit scheduled: Yes  Notes to clinic:  Unable to refill per protocol due to failed labs, no updated results.      Requested Prescriptions  Pending Prescriptions Disp Refills   allopurinol (ZYLOPRIM) 100 MG tablet [Pharmacy Med Name: ALLOPURINOL 100 MG TABLET] 30 tablet 12    Sig: TAKE 1 TABLET BY MOUTH EVERY DAY     Endocrinology:  Gout Agents - allopurinol Failed - 03/11/2023  1:38 AM      Failed - Uric Acid in normal range and within 360 days    No results found for: "POCURA", "LABURIC"       Failed - Cr in normal range and within 360 days    Creatinine, Ser  Date Value Ref Range Status  10/18/2022 1.67 (H) 0.44 - 1.00 mg/dL Final         Failed - CBC within normal limits and completed in the last 12 months    WBC  Date Value Ref Range Status  10/18/2022 7.3 4.0 - 10.5 K/uL Final   RBC  Date Value Ref Range Status  10/18/2022 4.56 3.87 - 5.11 MIL/uL Final   Hemoglobin  Date Value Ref Range Status  10/18/2022 13.8 12.0 - 15.0 g/dL Final  19/14/7829 56.2 11.1 - 15.9 g/dL Final   HCT  Date Value Ref Range Status  10/18/2022 42.5 36.0 - 46.0 % Final   Hematocrit  Date Value Ref Range Status  11/18/2021 42.7 34.0 - 46.6 % Final   MCHC  Date Value Ref Range Status  10/18/2022 32.5 30.0 - 36.0 g/dL Final   [facility]  Date Value Ref Range Status  10/18/2022 30.3 26.0 - 34.0 pg Final   MCV  Date Value Ref Range Status  10/18/2022 93.2 80.0 - 100.0 fL Final  11/18/2021 93 79 - 97 fL Final   No results found for: "PLTCOUNTKUC", "LABPLAT", "POCPLA" RDW  Date Value Ref Range Status  10/18/2022 13.7 11.5 - 15.5 % Final  11/18/2021 13.1 11.7 - 15.4 % Final         Passed - Valid encounter within last 12 months    Recent Outpatient Visits           2 weeks ago Type 2 diabetes mellitus with stage 3b chronic  kidney disease, without long-term current use of insulin (HCC)   [facility] Malva Limes, MD   3 months ago Other chest pain   [place] [facility] Malva Limes, MD   4 months ago Type 2 diabetes mellitus with stage 3b chronic kidney disease, without long-term current use of insulin (HCC)   [place] [facility] Malva Limes, MD   8 months ago Type 2 diabetes mellitus with stage 3b chronic kidney disease, without long-term current use of insulin (HCC)   [place] [facility] Malva Limes, MD   9 months ago Hyperglycemia   [place] [facility] Malva Limes, MD       Future Appointments             In 2 months Fisher, Demetrios Isaacs, MD [facility]

## 2023-03-16 NOTE — Telephone Encounter (Signed)
This Key is no longer accepted. Spoke with patient and reports that she is going to call her pharmacy to have it fill and if it needs a PA that she is going to message [place]. Please see other message about this.

## 2023-03-17 ENCOUNTER — Other Ambulatory Visit: Payer: Self-pay | Admitting: Family Medicine

## 2023-03-17 DIAGNOSIS — F3178 Bipolar disorder, in full remission, most recent episode mixed: Secondary | ICD-10-CM

## 2023-03-17 NOTE — Telephone Encounter (Signed)
Requested medications are due for refill today.  yes  Requested medications are on the active medications list.  yes  Last refill. 02/26/2022 #60 12 rf  Future visit scheduled.   yes  Notes to clinic.  Refill/refusal not delegated.    Requested Prescriptions  Pending Prescriptions Disp Refills   QUEtiapine (SEROQUEL) 200 MG tablet [Pharmacy Med Name: QUEtiapine FUMARATE 200 MG TAB] 60 tablet 12    Sig: TAKE 1 TABLET BY MOUTH TWICE A DAY     Not Delegated - Psychiatry:  Antipsychotics - Second Generation (Atypical) - quetiapine Failed - 03/17/2023  6:23 AM      Failed - This refill cannot be delegated      Failed - Lipid Panel in normal range within the last 12 months    Cholesterol, Total  Date Value Ref Range Status  10/29/2022 156 100 - 199 mg/dL Final   LDL Chol Calc (NIH)  Date Value Ref Range Status  10/29/2022 75 0 - 99 mg/dL Final   HDL  Date Value Ref Range Status  10/29/2022 33 (L) >39 mg/dL Final   Triglycerides  Date Value Ref Range Status  10/29/2022 298 (H) 0 - 149 mg/dL Final         Failed - CBC within normal limits and completed in the last 12 months    WBC  Date Value Ref Range Status  10/18/2022 7.3 4.0 - 10.5 K/uL Final   RBC  Date Value Ref Range Status  10/18/2022 4.56 3.87 - 5.11 MIL/uL Final   Hemoglobin  Date Value Ref Range Status  10/18/2022 13.8 12.0 - 15.0 g/dL Final  60/45/4098 11.9 11.1 - 15.9 g/dL Final   HCT  Date Value Ref Range Status  10/18/2022 42.5 36.0 - 46.0 % Final   Hematocrit  Date Value Ref Range Status  11/18/2021 42.7 34.0 - 46.6 % Final   MCHC  Date Value Ref Range Status  10/18/2022 32.5 30.0 - 36.0 g/dL Final   [facility]  Date Value Ref Range Status  10/18/2022 30.3 26.0 - 34.0 pg Final   MCV  Date Value Ref Range Status  10/18/2022 93.2 80.0 - 100.0 fL Final  11/18/2021 93 79 - 97 fL Final   No results found for: "PLTCOUNTKUC", "LABPLAT", "POCPLA" RDW  Date Value Ref Range Status  10/18/2022 13.7  11.5 - 15.5 % Final  11/18/2021 13.1 11.7 - 15.4 % Final         Failed - CMP within normal limits and completed in the last 12 months    Albumin  Date Value Ref Range Status  11/18/2021 4.6 3.8 - 4.9 g/dL Final   Alkaline Phosphatase  Date Value Ref Range Status  11/18/2021 73 44 - 121 IU/L Final   ALT  Date Value Ref Range Status  11/18/2021 19 0 - 32 IU/L Final   AST  Date Value Ref Range Status  11/18/2021 27 0 - 40 IU/L Final   BUN  Date Value Ref Range Status  10/18/2022 23 (H) 6 - 20 mg/dL Final  14/78/2956 9 6 - 24 mg/dL Final   Calcium  Date Value Ref Range Status  10/18/2022 9.1 8.9 - 10.3 mg/dL Final   CO2  Date Value Ref Range Status  10/18/2022 22 22 - 32 mmol/L Final   Creatinine, Ser  Date Value Ref Range Status  10/18/2022 1.67 (H) 0.44 - 1.00 mg/dL Final   Glucose  Date Value Ref Range Status  08/13/2019 335  Final   Glucose,  Bld  Date Value Ref Range Status  10/18/2022 233 (H) 70 - 99 mg/dL Final    Comment:    Glucose reference range applies only to samples taken after fasting for at least 8 hours.   Glucose-Capillary  Date Value Ref Range Status  06/08/2022 143 (H) 70 - 99 mg/dL Final    Comment:    Glucose reference range applies only to samples taken after fasting for at least 8 hours.   Potassium  Date Value Ref Range Status  10/18/2022 4.3 3.5 - 5.1 mmol/L Final   Sodium  Date Value Ref Range Status  10/18/2022 135 135 - 145 mmol/L Final  11/18/2021 136 134 - 144 mmol/L Final   Bilirubin Total  Date Value Ref Range Status  11/18/2021 0.4 0.0 - 1.2 mg/dL Final   Bilirubin, Direct  Date Value Ref Range Status  09/25/2021 0.2 0.0 - 0.2 mg/dL Final  16/01/9603 5.40 0.00 - 0.40 mg/dL Final   Indirect Bilirubin  Date Value Ref Range Status  09/25/2021 0.7 0.3 - 0.9 mg/dL Final    Comment:    Performed at [facility], [address], [place], [place] [address]   Protein, ur  Date Value Ref Range Status   09/25/2021 NEGATIVE NEGATIVE mg/dL Final   Protein, UA  Date Value Ref Range Status  10/02/2021 Negative Negative Final   Total Protein  Date Value Ref Range Status  11/18/2021 7.5 6.0 - 8.5 g/dL Final   GFR calc Af Amer  Date Value Ref Range Status  08/13/2019 44  Final   eGFR  Date Value Ref Range Status  11/18/2021 35 (L) >59 mL/min/1.73 Final   GFR, Estimated  Date Value Ref Range Status  10/18/2022 36 (L) >60 mL/min Final    Comment:    (NOTE) Calculated using the CKD-EPI Creatinine Equation (2021)          Passed - TSH in normal range and within 360 days    TSH  Date Value Ref Range Status  10/29/2022 1.960 0.450 - 4.500 uIU/mL Final         Passed - Last BP in normal range    BP Readings from Last 1 Encounters:  02/23/23 112/70         Passed - Last Heart Rate in normal range    Pulse Readings from Last 1 Encounters:  02/23/23 (!) 109         Passed - Valid encounter within last 6 months    Recent Outpatient Visits           3 weeks ago Type 2 diabetes mellitus with stage 3b chronic kidney disease, without long-term current use of insulin (HCC)   [facility] [place] Kim Limes, MD   4 months ago Other chest pain   [place] [facility] Kim Limes, MD   4 months ago Type 2 diabetes mellitus with stage 3b chronic kidney disease, without long-term current use of insulin (HCC)   [place] [facility] Kim Limes, MD   8 months ago Type 2 diabetes mellitus with stage 3b chronic kidney disease, without long-term current use of insulin (HCC)   [place] [facility] Kim Limes, MD   9 months ago Hyperglycemia   [facility] Kim Limes, MD       Future Appointments             In 2 months Mila Merry  E, MD [place] [facility]

## 2023-03-29 ENCOUNTER — Encounter: Payer: Self-pay | Admitting: Family Medicine

## 2023-04-20 ENCOUNTER — Other Ambulatory Visit: Payer: Self-pay | Admitting: Family Medicine

## 2023-04-28 ENCOUNTER — Encounter: Payer: Self-pay | Admitting: Physician Assistant

## 2023-04-28 ENCOUNTER — Ambulatory Visit: Payer: Self-pay

## 2023-04-28 ENCOUNTER — Ambulatory Visit (INDEPENDENT_AMBULATORY_CARE_PROVIDER_SITE_OTHER): Payer: Medicaid Other | Admitting: Physician Assistant

## 2023-04-28 VITALS — BP 118/70 | HR 72 | Resp 16 | Ht 63.0 in | Wt 215.0 lb

## 2023-04-28 DIAGNOSIS — R300 Dysuria: Secondary | ICD-10-CM | POA: Diagnosis not present

## 2023-04-28 DIAGNOSIS — N390 Urinary tract infection, site not specified: Secondary | ICD-10-CM

## 2023-04-28 DIAGNOSIS — R319 Hematuria, unspecified: Secondary | ICD-10-CM

## 2023-04-28 LAB — POCT URINALYSIS DIPSTICK
Appearance: NORMAL
Bilirubin, UA: NEGATIVE
Blood, UA: POSITIVE — AB
Glucose, UA: NEGATIVE
Ketones, UA: NEGATIVE
Nitrite, UA: POSITIVE — AB
Protein, UA: NEGATIVE
Spec Grav, UA: 1.01 (ref 1.010–1.025)
Urobilinogen, UA: 0.2 U/dL
pH, UA: 6.5 (ref 5.0–8.0)

## 2023-04-28 MED ORDER — NITROFURANTOIN MONOHYD MACRO 100 MG PO CAPS: 100.0000 mg | ORAL_CAPSULE | Freq: Two times a day (BID) | ORAL | 0 refills | Status: AC

## 2023-04-28 MED ORDER — PHENAZOPYRIDINE HCL 100 MG PO TABS: 100.0000 mg | ORAL_TABLET | Freq: Three times a day (TID) | ORAL | 0 refills | Status: DC | PRN

## 2023-04-28 NOTE — Patient Instructions (Signed)
Based on your symptoms and results of the urinalysis I believe you have a UTI I recommend the following:  I have sent in a script for Bactrim to be taken by mouth twice per day for 5 days   Please finish the entire course of the antibiotic even if you are feeling better before it is completed. Stay well hydrated (at least 75 oz of water per day) and avoid holding your urine If you have any of the following please let us know or go to the ED: persistent symptoms, fever, trouble urinating or inability to urinate, confusion, flank pain.

## 2023-04-28 NOTE — Telephone Encounter (Signed)
Chief Complaint: Urine Frequency  Symptoms: Increased urine frequency & Urgency, burning with urinating, lower back pain Frequency: constant  Pertinent Negatives: Patient denies fever, nausea, vomiting, abdominal pain  Disposition: [] ED /[] Urgent Care (no appt availability in office) / [x] Appointment(In office/virtual)/ []  [place] [facility]/ [] Home Care/ [] Refused Recommended Disposition /[] [place] [facility]/ []  Follow-up with PCP Additional Notes: Patient states she has had increased frequency and urgency since yesterday along with burning with urination and lower back pain. Patient states she has tried drinking water and cranberry juice without much improvement and the burning pain is a 8/10 when urinating. Care advice was given and patient is requesting an appointment. No availability in the office today or tomorrow. Patient has been [facility] at [facility] at 1500.   Summary: UTI Sx Advice   Pt is calling to report UTI sx with frequency urinating,lower back pain (mild), and vaginal burning. No avaible appts until Monday with Dr. Payton Mccallum. Please advise     Reason for Disposition  Side (flank) or lower back pain present  Answer Assessment - Initial Assessment Questions 1. SYMPTOM: "What's the main symptom you're concerned about?" (e.g., frequency, incontinence)     Frequency 2. ONSET: "When did the  frequency  start?"     Yesterday  3. PAIN: "Is there any pain?" If Yes, ask: "How bad is it?" (Scale: 1-10; mild, moderate, severe)     8/10 4. CAUSE: "What do you think is causing the symptoms?"     UTI 5. OTHER SYMPTOMS: "Do you have any other symptoms?" (e.g., blood in urine, fever, flank pain, pain with urination)     Lower back pain, burning with urinating, increased frequency, increased urgency  Protocols used: Urinary Symptoms-A-AH

## 2023-04-28 NOTE — Progress Notes (Signed)
Acute Office Visit   Patient: Kim Obrien   DOB: [birth year]   58 y.o. Female  MRN: [id number] Visit Date: 04/28/2023  Today's healthcare provider: Oswaldo Conroy Lyanne Kates, PA-C  Introduced myself to the patient as a Secondary school teacher and provided education on APPs in clinical practice.    Chief Complaint  Patient presents with   Urinary Tract Infection    Dysuria and burning x1 day. Took Azo yesterday   Subjective    HPI HPI     Urinary Tract Infection    Additional comments: Dysuria and burning x1 day. Took Azo yesterday      Last edited by Dollene Primrose, CMA on 04/28/2023  3:02 PM.     Discussed the use of AI scribe software for clinical note transcription with the patient, who gave verbal consent to proceed.  History of Present Illness   The patient presented with symptoms suggestive of a urinary tract infection (UTI), which began the day prior to the consultation. They reported experiencing a low-grade fever and chills, although the exact temperature was not measured. The patient described dysuria, with pain during urination and a burning sensation. They also noted an increased frequency of urination, often feeling the urge to go but only passing small amounts each time.  The patient reported suprapubic pain and discomfort in the right flank. They denied any hematuria, but it was noted during the consultation. The patient's last UTI was approximately a year ago. Interestingly, the patient had just completed a course of amoxicillin (875 mg for seven days) for a sinus infection the previous week. They denied any vaginal pain, bleeding, or discharge.  The patient has a history of UTIs caused by various bacteria, all of which have been susceptible to Bactrim in the past. However, due to a potential interaction with their metformin medication, a different antibiotic, Macrobid, was considered. The patient was unsure if they had previously taken Bactrim. They also reported taking  over-the-counter medication, AZO,  to help with the discomfort.  The patient expressed concern about the possibility of a kidney infection, as they had experienced them in the past.       Medications: Outpatient Medications Prior to Visit  Medication Sig   allopurinol (ZYLOPRIM) 100 MG tablet TAKE 1 TABLET BY MOUTH EVERY DAY   aspirin EC 81 MG tablet Take 1 tablet (81 mg total) by mouth daily. Swallow whole.   clonazePAM (KLONOPIN) 0.5 MG tablet Take 1 tablet (0.5 mg total) by mouth 2 (two) times daily as needed for anxiety.   dicyclomine (BENTYL) 10 MG capsule TAKE 1 CAPSULE (10 MG TOTAL) BY MOUTH 4 TIMES A DAY BEFORE MEALS AND AT BEDTIME   FARXIGA 10 MG TABS tablet Take 10 mg by mouth daily.   glipiZIDE (GLUCOTROL XL) 5 MG 24 hr tablet Take 5 mg by mouth in the morning and at bedtime.   losartan (COZAAR) 25 MG tablet Take 25 mg by mouth at bedtime.   lovastatin (MEVACOR) 40 MG tablet Take 1 tablet (40 mg total) by mouth daily.   metFORMIN (GLUCOPHAGE) 500 MG tablet Take 1 tablet (500 mg total) by mouth every evening.   omeprazole (PRILOSEC) 20 MG capsule TAKE 1 CAPSULE BY MOUTH EVERY DAY   ondansetron (ZOFRAN) 4 MG tablet Take 1 tablet (4 mg total) by mouth every 8 (eight) hours as needed for nausea or vomiting.   QUEtiapine (SEROQUEL) 200 MG tablet TAKE 1 TABLET BY MOUTH TWICE A DAY  sodium bicarbonate 650 MG tablet TAKE 2 TABLETS (1,300 MG TOTAL) BY MOUTH 2 (TWO) TIMES DAILY.   spironolactone (ALDACTONE) 50 MG tablet Take 1 tablet (50 mg total) by mouth 2 (two) times daily.   tirzepatide (MOUNJARO) 7.5 MG/0.5ML Pen Inject 7.5 mg into the skin once a week.   No facility-administered medications prior to visit.    Review of Systems  Constitutional:  Positive for chills and fever (subjective).  Gastrointestinal:  Positive for abdominal pain (suprapubic tenderness and pressure).  Genitourinary:  Positive for dysuria, flank pain (predominantly on right side), frequency and urgency.  Negative for decreased urine volume, difficulty urinating, enuresis, hematuria, vaginal bleeding, vaginal discharge and vaginal pain.        Objective    BP 118/70   Pulse 72   Resp 16   Ht 5\' 3"  (1.6 m)   Wt 215 lb (97.5 kg)   LMP  (LMP Unknown) Comment: postmenapausal  SpO2 96%   BMI 38.09 kg/m     Physical Exam Vitals reviewed.  Constitutional:      General: She is awake.     Appearance: Normal appearance. She is well-developed and well-groomed.  HENT:     Head: Normocephalic and atraumatic.  Eyes:     General: Lids are normal. Gaze aligned appropriately.     Extraocular Movements: Extraocular movements intact.     Conjunctiva/sclera: Conjunctivae normal.  Pulmonary:     Effort: Pulmonary effort is normal.  Neurological:     General: No focal deficit present.     Mental Status: She is alert and oriented to person, place, and time.     GCS: GCS eye subscore is 4. GCS verbal subscore is 5. GCS motor subscore is 6.     Cranial Nerves: No cranial nerve deficit, dysarthria or facial asymmetry.  Psychiatric:        Attention and Perception: Attention and perception normal.        Mood and Affect: Mood and affect normal.        Speech: Speech normal.        Behavior: Behavior normal. Behavior is cooperative.       Results for orders placed or performed in visit on 04/28/23  POCT urinalysis dipstick  Result Value Ref Range   Color, UA Dark Yellow    Clarity, UA Cloudy    Glucose, UA Negative Negative   Bilirubin, UA Negative    Ketones, UA Negative    Spec Grav, UA 1.010 1.010 - 1.025   Blood, UA Positive (A)    pH, UA 6.5 5.0 - 8.0   Protein, UA Negative Negative   Urobilinogen, UA 0.2 0.2 or 1.0 E.U./dL   Nitrite, UA Positive (A)    Leukocytes, UA Trace (A) Negative   Appearance Normal    Odor None     Assessment & Plan      No follow-ups on file.       Problem List Items Addressed This Visit   None Visit Diagnoses       Urinary tract infection  with hematuria, site unspecified    -  Primary   Relevant Medications   phenazopyridine (PYRIDIUM) 100 MG tablet   nitrofurantoin, macrocrystal-monohydrate, (MACROBID) 100 MG capsule     Dysuria       Relevant Medications   phenazopyridine (PYRIDIUM) 100 MG tablet   Other Relevant Orders   Urine Culture   POCT urinalysis dipstick (Completed)      Assessment and Plan  Urinary Tract Infection (UTI) Presents with dysuria, increased urinary frequency, and suprapubic pain since yesterday. Microscopic hematuria observed. Recently completed amoxicillin for sinus infection, potentially disrupting normal flora. Differential includes early kidney infection, but current symptoms do not strongly suggest this. Previous UTIs susceptible to Bactrim, but due to potential interaction with metformin, Macrobid is preferred. Discussed Bactrim's potential to increase metformin concentration, leading to toxicity. Macrobid is an effective alternative unless culture results indicate otherwise. Discussed antibiotic-induced bacterial imbalance. - Send urine culture for bacterial identification and antibiotic susceptibility - Prescribe Macrobid - Prescribe Pyridium for discomfort - Advise increased water intake - Discuss potential benefits of cranberry juice  General Health Maintenance Discussed mixed evidence on cranberry juice for UTI prevention, but additional fluids can aid urinary flushing. - Advise optional cranberry juice for fluid intake  Follow-up - Update with urine culture results and adjust antibiotic if necessary.        No follow-ups on file.   I, Kennidee Heyne E Lezley Bedgood, PA-C, have reviewed all documentation for this visit. The documentation on 04/29/23 for the exam, diagnosis, procedures, and orders are all accurate and complete.   Jacquelin Hawking, MHS, PA-C [facility]

## 2023-04-30 LAB — URINE CULTURE
MICRO NUMBER:: 15965512
SPECIMEN QUALITY:: ADEQUATE

## 2023-05-02 ENCOUNTER — Encounter: Payer: Self-pay | Admitting: Physician Assistant

## 2023-05-02 NOTE — Progress Notes (Signed)
Your urine culture was positive for E.coli bacteria. This bacteria was susceptible to the Macrobid that we started so please make sure you finish the entire course as directed.

## 2023-05-24 LAB — MICROALBUMIN / CREATININE URINE RATIO
ALBUMIN, U: <0.2
Creatinine Random, Urine: 30
Microalb Creat Ratio: 0

## 2023-05-27 ENCOUNTER — Encounter: Payer: Self-pay | Admitting: Family Medicine

## 2023-05-27 ENCOUNTER — Ambulatory Visit (INDEPENDENT_AMBULATORY_CARE_PROVIDER_SITE_OTHER): Payer: Medicaid Other | Admitting: Family Medicine

## 2023-05-27 VITALS — BP 118/76 | HR 119 | Resp 16 | Wt 214.3 lb

## 2023-05-27 DIAGNOSIS — E1122 Type 2 diabetes mellitus with diabetic chronic kidney disease: Secondary | ICD-10-CM

## 2023-05-27 DIAGNOSIS — N1832 Chronic kidney disease, stage 3b: Secondary | ICD-10-CM

## 2023-05-27 DIAGNOSIS — N390 Urinary tract infection, site not specified: Secondary | ICD-10-CM

## 2023-05-27 DIAGNOSIS — R300 Dysuria: Secondary | ICD-10-CM

## 2023-05-27 DIAGNOSIS — R109 Unspecified abdominal pain: Secondary | ICD-10-CM

## 2023-05-27 DIAGNOSIS — R319 Hematuria, unspecified: Secondary | ICD-10-CM | POA: Diagnosis not present

## 2023-05-27 LAB — POCT URINALYSIS DIPSTICK
Bilirubin, UA: NEGATIVE
Glucose, UA: POSITIVE — AB
Ketones, UA: NEGATIVE
Nitrite, UA: NEGATIVE
Protein, UA: POSITIVE — AB
Spec Grav, UA: 1.01 (ref 1.010–1.025)
Urobilinogen, UA: 0.2 U/dL
pH, UA: 6 (ref 5.0–8.0)

## 2023-05-27 LAB — POCT GLYCOSYLATED HEMOGLOBIN (HGB A1C)
Est. average glucose Bld gHb Est-mCnc: 146
Hemoglobin A1C: 6.7 % — AB (ref 4.0–5.6)

## 2023-05-27 MED ORDER — CEFDINIR 300 MG PO CAPS: 600.0000 mg | ORAL_CAPSULE | Freq: Every day | ORAL | 0 refills | Status: AC

## 2023-05-27 NOTE — Patient Instructions (Signed)
Marland Kitchen  Please review the attached list of medications and notify my office if there are any errors.   . Please bring all of your medications to every appointment so we can make sure that our medication list is the same as yours.

## 2023-05-27 NOTE — Progress Notes (Signed)
Established patient visit   Patient: Kim Obrien   DOB: [birth year]   58 y.o. Female  MRN: [id number] Visit Date: 05/27/2023  Today's healthcare provider: Mila Merry, MD   Chief Complaint  Patient presents with   Medical Management of Chronic Issues    T2DM   Dysuria    Painful urination, lower back pain for a month.   Subjective    Discussed the use of AI scribe software for clinical note transcription with the patient, who gave verbal consent to proceed.  History of Present Illness   Kim Obrien is a 58 year old female with diabetes who presents with a suspected urinary tract infection and follow-up on diabetes.  Symptoms suggestive of a urinary tract infection began last night and continued into today, similar to previous episodes. A urinalysis indicated a UTI. She recalls a UTI with E. Coli a month ago, for which she received a course of macrodantin. She has a remote history of bladder infections but not recently until the current episode. No allergies are reported.  Her diabetes management includes a recent A1c of 6.7% and home blood sugar readings around 120 mg/dL. No hypoglycemic episodes. She is on 7.5 mg of Mounjaro, which causes mild queasiness the day after administration, but is better tolerated than Ozempic. She is no longer taking glipizide and continues on Farxiga and metformin. She uses Zofran as needed for nausea and requests a refill.  She mentions persistent lower back pain over the past month, initially attributed to a kidney issue or UTI. She has a history of ovarian cyst removal, which previously presented with similar back pain. The pain is localized to the lower back and slightly on the side, with no pain in the front or pelvic area. A CT scan was performed about a year and a half ago.       Medications: Outpatient Medications Prior to Visit  Medication Sig   allopurinol (ZYLOPRIM) 100 MG tablet TAKE 1 TABLET BY MOUTH EVERY DAY   aspirin EC 81  MG tablet Take 1 tablet (81 mg total) by mouth daily. Swallow whole.   clonazePAM (KLONOPIN) 0.5 MG tablet Take 1 tablet (0.5 mg total) by mouth 2 (two) times daily as needed for anxiety.   dicyclomine (BENTYL) 10 MG capsule TAKE 1 CAPSULE (10 MG TOTAL) BY MOUTH 4 TIMES A DAY BEFORE MEALS AND AT BEDTIME   FARXIGA 10 MG TABS tablet Take 10 mg by mouth daily.   losartan (COZAAR) 25 MG tablet Take 25 mg by mouth at bedtime.   lovastatin (MEVACOR) 40 MG tablet Take 1 tablet (40 mg total) by mouth daily.   metFORMIN (GLUCOPHAGE) 500 MG tablet Take 1 tablet (500 mg total) by mouth every evening.   omeprazole (PRILOSEC) 20 MG capsule TAKE 1 CAPSULE BY MOUTH EVERY DAY   ondansetron (ZOFRAN) 4 MG tablet Take 1 tablet (4 mg total) by mouth every 8 (eight) hours as needed for nausea or vomiting.   QUEtiapine (SEROQUEL) 200 MG tablet TAKE 1 TABLET BY MOUTH TWICE A DAY   sodium bicarbonate 650 MG tablet TAKE 2 TABLETS (1,300 MG TOTAL) BY MOUTH 2 (TWO) TIMES DAILY.   spironolactone (ALDACTONE) 50 MG tablet Take 1 tablet (50 mg total) by mouth 2 (two) times daily.   tirzepatide (MOUNJARO) 7.5 MG/0.5ML Pen Inject 7.5 mg into the skin once a week.   glipiZIDE (GLUCOTROL XL) 5 MG 24 hr tablet Take 5 mg by mouth in the morning and  at bedtime.   phenazopyridine (PYRIDIUM) 100 MG tablet Take 1 tablet (100 mg total) by mouth 3 (three) times daily as needed for pain.   No facility-administered medications prior to visit.   Review of Systems     Objective    BP 118/76 (BP Location: Left Arm, Patient Position: Sitting, Cuff Size: Large)   Pulse (!) 119   Resp 16   Wt 214 lb 4.8 oz (97.2 kg)   LMP  (LMP Unknown) Comment: postmenapausal  BMI 37.96 kg/m   Physical Exam   General: Appearance:    Mildly obese female in no acute distress  Eyes:    PERRL, conjunctiva/corneas clear, EOM's intact       Lungs:     Clear to auscultation bilaterally, respirations unlabored  Heart:    Tachycardic. Normal rhythm. No  murmurs, rubs, or gallops.    Abd:   Right posterior CVA tenderness  MS:   All extremities are intact.    Neurologic:   Awake, alert, oriented x 3. No apparent focal neurological defect.           Results for orders placed or performed in visit on 05/27/23  Microalbumin / creatinine urine ratio  Result Value Ref Range   Microalb Creat Ratio 0    ALBUMIN, U <0.2 mg/dL    Creatinine Random, Urine 30 mg/dL   POCT glycosylated hemoglobin (Hb A1C)  Result Value Ref Range   Hemoglobin A1C 6.7 (A) 4.0 - 5.6 %   Est. average glucose Bld gHb Est-mCnc 146   POCT urinalysis dipstick  Result Value Ref Range   Color, UA Dark Yellow    Clarity, UA Cloudy    Glucose, UA Positive (A) Negative   Bilirubin, UA Negative    Ketones, UA Negative    Spec Grav, UA 1.010 1.010 - 1.025   Blood, UA Large    pH, UA 6.0 5.0 - 8.0   Protein, UA Positive (A) Negative   Urobilinogen, UA 0.2 0.2 or 1.0 E.U./dL   Nitrite, UA Negative    Leukocytes, UA Moderate (2+) (A) Negative   Appearance     Odor      Assessment & Plan       Urinary Tract Infection Recurrent UTIs, most recent one month ago with E. Coli. Current urinalysis suggestive of UTI. Unclear if this is a new infection or incomplete resolution of the previous one. -Start Cefdinir, pending culture results. -Order urine culture and sensitivity.  Abdominal Pain New onset, daily pain in the lower back and side for the past month. No associated pelvic pain. History of ovarian cyst removal. -Order abdominal ultrasound to evaluate for possible causes.  Type 2 Diabetes Mellitus Well controlled with A1c of 6.7. Currently on Mounjaro 7.5mg  and metformin, with some GI side effects after Mounjaro injection. -Continue current regimen. -Check A1c in 4 months.  Medication Refill Zofran needed for nausea. -Refill Zofran prescription.  Follow-up Nephrologist recently performed albumin to creatinine ratio. -Schedule follow-up appointment in the  summer.        Mila Merry, MD  [facility] [phone] (phone) [phone] (fax)  [facility]

## 2023-05-30 LAB — URINE CULTURE

## 2023-05-31 ENCOUNTER — Other Ambulatory Visit: Payer: Self-pay

## 2023-05-31 DIAGNOSIS — I10 Essential (primary) hypertension: Secondary | ICD-10-CM

## 2023-05-31 LAB — URINALYSIS, MICROSCOPIC ONLY

## 2023-06-01 ENCOUNTER — Telehealth: Payer: Self-pay

## 2023-06-01 NOTE — Progress Notes (Signed)
 ..  Patient declines further follow up and engagement by the Managed Medicaid Team. Appropriate care team members and provider have been notified via electronic communication. The Managed Medicaid Team is available to follow up with the patient after provider conversation with the patient regarding recommendation for engagement and subsequent re-referral to the Managed Medicaid Team.     Weston Settle [facility], [facility] Guide Direct Dial: [phone]  Fax: [phone]

## 2023-06-02 ENCOUNTER — Other Ambulatory Visit: Payer: Self-pay | Admitting: Family Medicine

## 2023-06-02 DIAGNOSIS — N1832 Chronic kidney disease, stage 3b: Secondary | ICD-10-CM

## 2023-06-03 ENCOUNTER — Encounter: Payer: Self-pay | Admitting: Family Medicine

## 2023-06-03 ENCOUNTER — Ambulatory Visit
Admission: RE | Admit: 2023-06-03 | Discharge: 2023-06-03 | Disposition: A | Discharge status: Home / Self Care | Payer: Medicaid Other | Source: Ambulatory Visit | Attending: Family Medicine | Admitting: Family Medicine

## 2023-06-03 DIAGNOSIS — R319 Hematuria, unspecified: Secondary | ICD-10-CM | POA: Diagnosis present

## 2023-06-03 DIAGNOSIS — R109 Unspecified abdominal pain: Secondary | ICD-10-CM | POA: Insufficient documentation

## 2023-06-03 DIAGNOSIS — N390 Urinary tract infection, site not specified: Secondary | ICD-10-CM | POA: Diagnosis present

## 2023-06-10 ENCOUNTER — Telehealth: Payer: Self-pay

## 2023-06-10 NOTE — Telephone Encounter (Signed)
 Ultrasound shows fatty liver. Normal gallbladder. Signs of chronic kidney disease which she is already seeing Dr. Cherylann Ratel about. Best treatment for fatty liver are GLP-1 medications like Ozempic and Moujaro.

## 2023-06-10 NOTE — Telephone Encounter (Signed)
 Copied from CRM [id number]. Topic: Clinical - Lab/Test Results >> Jun 10, 2023  3:06 PM Alcus Dad H wrote: Reason for CRM: Patient is calling about ultrasound results, says she can see the results on her MyChart but doesn't understand what all of that means. Would like a call back.

## 2023-06-23 ENCOUNTER — Other Ambulatory Visit: Payer: Self-pay | Admitting: Family Medicine

## 2023-06-23 DIAGNOSIS — K2960 Other gastritis without bleeding: Secondary | ICD-10-CM

## 2023-09-07 ENCOUNTER — Other Ambulatory Visit: Payer: Self-pay | Admitting: Family Medicine

## 2023-09-26 LAB — BASIC METABOLIC PANEL WITH GFR
BUN: 16 (ref 4–21)
CO2: 24 — AB (ref 13–22)
Chloride: 104 (ref 99–108)
Creatinine: 1.6 — AB (ref 0.5–1.1)
Potassium: 4.6 meq/L (ref 3.5–5.1)
Sodium: 139 (ref 137–147)

## 2023-09-26 LAB — COMPREHENSIVE METABOLIC PANEL WITH GFR: eGFR: 37

## 2023-09-28 ENCOUNTER — Telehealth: Payer: Self-pay | Admitting: Family Medicine

## 2023-09-28 ENCOUNTER — Other Ambulatory Visit: Payer: Self-pay

## 2023-09-28 MED ORDER — LOSARTAN POTASSIUM 25 MG PO TABS: 25.0000 mg | ORAL_TABLET | Freq: Every day | ORAL | 0 refills | Status: AC

## 2023-09-28 NOTE — Telephone Encounter (Signed)
 [facility] is requesting refill losartan (COZAAR) 25 MG tablet  Please advise

## 2023-09-30 ENCOUNTER — Ambulatory Visit: Payer: Disability Insurance | Admitting: Family Medicine

## 2023-10-18 ENCOUNTER — Other Ambulatory Visit: Payer: Self-pay | Admitting: Family Medicine

## 2023-10-22 ENCOUNTER — Other Ambulatory Visit: Payer: Self-pay | Admitting: Family Medicine

## 2023-10-22 DIAGNOSIS — E1169 Type 2 diabetes mellitus with other specified complication: Secondary | ICD-10-CM

## 2023-10-26 ENCOUNTER — Other Ambulatory Visit (HOSPITAL_COMMUNITY): Payer: Self-pay

## 2023-10-26 ENCOUNTER — Telehealth: Payer: Self-pay

## 2023-10-26 NOTE — Telephone Encounter (Signed)
 Pharmacy Patient Advocate Encounter   Received notification from CoverMyMeds that prior authorization for Mounjaro  2.5MG /0.5ML auto-injectors is required/requested.   Insurance verification completed.   The patient is insured through [facility] [employer] .   Per test claim: Refill too soon. PA is not needed at this time. Medication was filled 08/17/2023. Next eligible fill date is 11/05/2023.

## 2023-10-28 ENCOUNTER — Encounter: Payer: Self-pay | Admitting: Family Medicine

## 2023-10-28 ENCOUNTER — Ambulatory Visit (INDEPENDENT_AMBULATORY_CARE_PROVIDER_SITE_OTHER): Admitting: Family Medicine

## 2023-10-28 VITALS — BP 103/70 | HR 97 | Resp 16 | Ht 63.0 in | Wt 208.9 lb

## 2023-10-28 DIAGNOSIS — E1169 Type 2 diabetes mellitus with other specified complication: Secondary | ICD-10-CM

## 2023-10-28 DIAGNOSIS — E1122 Type 2 diabetes mellitus with diabetic chronic kidney disease: Secondary | ICD-10-CM

## 2023-10-28 DIAGNOSIS — N1832 Chronic kidney disease, stage 3b: Secondary | ICD-10-CM | POA: Diagnosis not present

## 2023-10-28 DIAGNOSIS — Z1211 Encounter for screening for malignant neoplasm of colon: Secondary | ICD-10-CM

## 2023-10-28 DIAGNOSIS — Z7984 Long term (current) use of oral hypoglycemic drugs: Secondary | ICD-10-CM

## 2023-10-28 DIAGNOSIS — I10 Essential (primary) hypertension: Secondary | ICD-10-CM

## 2023-10-28 DIAGNOSIS — E785 Hyperlipidemia, unspecified: Secondary | ICD-10-CM

## 2023-10-28 LAB — POCT GLYCOSYLATED HEMOGLOBIN (HGB A1C)
Est. average glucose Bld gHb Est-mCnc: 151
Hemoglobin A1C: 6.9 % — AB (ref 4.0–5.6)

## 2023-10-28 MED ORDER — MOUNJARO 7.5 MG/0.5ML ~~LOC~~ SOAJ: 7.5000 mg | SUBCUTANEOUS | 3 refills | Status: DC

## 2023-10-28 NOTE — Progress Notes (Signed)
 Established patient visit   Patient: Kim Obrien   DOB: October 14, 1965   58 y.o. Female  MRN: [id number] Visit Date: 10/28/2023  Today's healthcare provider: Nancyann Perry, MD   Chief Complaint  Patient presents with   Medical Management of Chronic Issues    T2DM Eye exam not up to date   Subjective    Discussed the use of AI scribe software for clinical note transcription with the patient, who gave verbal consent to proceed.  History of Present Illness   Kim Obrien is a 58 year old female with diabetes, hyperlipidemia, hypertension, and chronic kidney disease who presents for a checkup and follow-up on her blood sugar levels.  Her blood sugar levels have been stable, typically around 150 mg/dL when checked at home. Her last hemoglobin A1c was 6.9%. She is currently taking 7.5 mg of Mounjaro  and metformin  without any issues.  She has chronic kidney disease and recently saw her kidney specialist three weeks ago. She recalls her kidney function was 32% as of about a month ago, based on her recent visit to the kidney specialist. She continues to take Farxiga as part of her treatment regimen.  She is on allopurinol  for gout history and reports no recent gout attacks since starting the medication.  No chest pains or heart flutters.     Lab Results  Component Value Date   HGBA1C 6.9 (A) 10/28/2023   HGBA1C 6.7 (A) 05/27/2023   HGBA1C 6.8 (H) 02/23/2023   Lab Results  Component Value Date   NA 139 09/26/2023   K 4.6 09/26/2023   CREATININE 1.6 (A) 09/26/2023   EGFR 37 09/26/2023   GLUCOSE 233 (H) 10/18/2022   Lab Results  Component Value Date   WBC 7.3 10/18/2022   HGB 13.8 10/18/2022   HCT 42.5 10/18/2022   MCV 93.2 10/18/2022   PLT 200 10/18/2022     Medications: Outpatient Medications Prior to Visit  Medication Sig   allopurinol  (ZYLOPRIM ) 100 MG tablet TAKE 1 TABLET BY MOUTH EVERY DAY   aspirin  EC 81 MG tablet Take 1 tablet (81 mg total) by mouth daily.  Swallow whole.   clonazePAM  (KLONOPIN ) 0.5 MG tablet Take 1 tablet (0.5 mg total) by mouth 2 (two) times daily as needed for anxiety.   dicyclomine  (BENTYL ) 10 MG capsule TAKE 1 CAPSULE (10 MG TOTAL) BY MOUTH 4 TIMES A DAY BEFORE MEALS AND AT BEDTIME   FARXIGA 10 MG TABS tablet Take 10 mg by mouth daily.   losartan  (COZAAR ) 25 MG tablet Take 1 tablet (25 mg total) by mouth at bedtime.   lovastatin  (MEVACOR ) 40 MG tablet TAKE 1 TABLET BY MOUTH EVERY DAY   metFORMIN  (GLUCOPHAGE ) 500 MG tablet Take 1 tablet (500 mg total) by mouth every evening.   omeprazole  (PRILOSEC) 20 MG capsule TAKE 1 CAPSULE BY MOUTH EVERY DAY   ondansetron  (ZOFRAN ) 4 MG tablet TAKE 1 TABLET BY MOUTH EVERY 8 HOURS AS NEEDED FOR NAUSEA AND VOMITING   QUEtiapine  (SEROQUEL ) 200 MG tablet TAKE 1 TABLET BY MOUTH TWICE A DAY   sodium bicarbonate  650 MG tablet TAKE 2 TABLETS (1,300 MG TOTAL) BY MOUTH 2 (TWO) TIMES DAILY.   spironolactone  (ALDACTONE ) 50 MG tablet Take 1 tablet (50 mg total) by mouth 2 (two) times daily.   [DISCONTINUED] tirzepatide  (MOUNJARO ) 7.5 MG/0.5ML Pen INJECT 7.5 MG SUBCUTANEOUSLY WEEKLY   No facility-administered medications prior to visit.   Review of Systems     Objective  BP 103/70 (BP Location: Left Arm, Patient Position: Sitting, Cuff Size: Normal)   Pulse 97   Resp 16   Ht 5' 3 (1.6 m)   Wt 208 lb 14.4 oz (94.8 kg)   LMP  (LMP Unknown) Comment: postmenapausal  SpO2 97%   BMI 37.00 kg/m   Physical Exam   General: Appearance:    Mildly obese female in no acute distress  Eyes:    PERRL, conjunctiva/corneas clear, EOM's intact       Lungs:     Clear to auscultation bilaterally, respirations unlabored  Heart:    Normal heart rate. Normal rhythm. No murmurs, rubs, or gallops.    MS:   All extremities are intact.    Neurologic:   Awake, alert, oriented x 3. No apparent focal neurological defect.        Assessment & Plan        Type 2 Diabetes Mellitus On Mounjaro  7.5 mg and metformin   with well-controlled blood sugar levels. Recent A1c is 6.9%, indicating good glycemic control. - Continue Mounjaro  7.5 mg. - Continue metformin . - Provide refill for Mounjaro .  Chronic Kidney Disease Under nephrology care with improved kidney function at 32%.  Gout history On allopurinol  with no recent flares, indicating effective management. - Continue allopurinol .  General Health Maintenance Due for cholesterol check and Cologuard test. Discussed pneumococcal vaccine, recommended for those over 50 and individuals with diabetes. - Order cholesterol check. - Order Cologuard test.    No follow-ups on file.     Nancyann Perry, MD  [facility] [phone] (phone) [phone] (fax)  [facility]

## 2023-10-28 NOTE — Patient Instructions (Signed)
 Please review the attached list of medications and notify my office if there are any errors.   I recommend that you get the Prevnar 20 vaccine to protect yourself from certain dangerous strains of pneumonia. You can get Prevnar 20 at your pharmacy, or call our office at [phone] at your earliest convenience to schedule this vaccine.

## 2023-11-02 ENCOUNTER — Ambulatory Visit: Admitting: Family Medicine

## 2023-11-02 ENCOUNTER — Other Ambulatory Visit: Payer: Self-pay | Admitting: Family Medicine

## 2023-11-02 DIAGNOSIS — E1122 Type 2 diabetes mellitus with diabetic chronic kidney disease: Secondary | ICD-10-CM

## 2023-11-11 ENCOUNTER — Ambulatory Visit: Payer: Self-pay

## 2023-11-11 ENCOUNTER — Other Ambulatory Visit (HOSPITAL_COMMUNITY): Payer: Self-pay

## 2023-11-11 ENCOUNTER — Telehealth: Payer: Self-pay | Admitting: Pharmacy Technician

## 2023-11-11 ENCOUNTER — Ambulatory Visit
Admission: EM | Admit: 2023-11-11 | Discharge: 2023-11-11 | Disposition: A | Discharge status: Home / Self Care | Attending: Emergency Medicine | Admitting: Emergency Medicine

## 2023-11-11 ENCOUNTER — Encounter: Payer: Self-pay | Admitting: Emergency Medicine

## 2023-11-11 DIAGNOSIS — R300 Dysuria: Secondary | ICD-10-CM | POA: Insufficient documentation

## 2023-11-11 DIAGNOSIS — N3000 Acute cystitis without hematuria: Secondary | ICD-10-CM | POA: Insufficient documentation

## 2023-11-11 LAB — POCT URINE DIPSTICK
Bilirubin, UA: NEGATIVE
Glucose, UA: 500 mg/dL — AB
Ketones, POC UA: NEGATIVE mg/dL
Nitrite, UA: POSITIVE — AB
POC PROTEIN,UA: 30 — AB
Spec Grav, UA: 1.01 (ref 1.010–1.025)
Urobilinogen, UA: 0.2 U/dL
pH, UA: 6 (ref 5.0–8.0)

## 2023-11-11 MED ORDER — CEPHALEXIN 500 MG PO CAPS: 500.0000 mg | ORAL_CAPSULE | Freq: Three times a day (TID) | ORAL | 0 refills | Status: AC

## 2023-11-11 NOTE — ED Provider Notes (Signed)
 Kim Obrien    CSN: [id number] Arrival date & time: 11/11/23  1036      History   Chief Complaint Chief Complaint  Patient presents with   Urinary Frequency   Back Pain   Dysuria    HPI Kim Obrien is a 58 y.o. female.   Patient presents for evaluation of urinary frequency, dysuria, left-sided low back pain and chills beginning 3 days ago.  Has attempted use of Azo and Tylenol .  Denies hematuria, abdominal pain or vaginal symptoms.  Past Medical History:  Diagnosis Date   Arthritis    Bipolar 1 disorder (HCC)    Diabetes mellitus without complication (HCC)    History of chicken pox     Patient Active Problem List   Diagnosis Date Noted   Type 2 diabetes mellitus with diabetic chronic kidney disease (HCC) 11/21/2019   Hyperlipidemia associated with type 2 diabetes mellitus (HCC) 08/17/2019   Hepatic steatosis 06/22/2019   IBS (irritable bowel syndrome) 06/22/2019   Hypertension 06/19/2018   Ovarian mass, left 11/05/2017   Ovarian torsion 11/05/2017   Torsion of left ovary and ovarian pedicle 11/05/2017   Loose bowel movement 07/10/2015   Arthritis 07/08/2015   Bipolar affective disorder (HCC) 07/08/2015   Gouty arthritis 07/08/2015   Osteoarthritis of knee 07/08/2015   Chronic abdominal pain 07/08/2015    Past Surgical History:  Procedure Laterality Date   ESOPHAGOGASTRODUODENOSCOPY N/A 04/29/2022   Procedure: ESOPHAGOGASTRODUODENOSCOPY (EGD);  Surgeon: [name], DO;  Location: [facility] ENDOSCOPY;  Service: Gastroenterology;  Laterality: N/A;   LAPAROSCOPY Left 11/04/2017   Procedure: operative laparoscopy, aspiration of left ovarian cyst, left salpingo-oopherectomy;  Surgeon: [name], MD;  Location: [facility] ORS;  Service: Gynecology;  Laterality: Left;   pap smear  2008   in [place]    partial wisdom tooth extraction  2004    OB History     Gravida  2   Para  2   Term      Preterm      AB      Living         SAB       IAB      Ectopic      Multiple      Live Births               Home Medications    Prior to Admission medications   Medication Sig Start Date End Date Taking? Authorizing Provider  cephALEXin  (KEFLEX ) 500 MG capsule Take 1 capsule (500 mg total) by mouth 3 (three) times daily for 5 days. 11/11/23 11/16/23 Yes Tonae Livolsi R, NP  allopurinol  (ZYLOPRIM ) 100 MG tablet TAKE 1 TABLET BY MOUTH EVERY DAY 03/14/23   [name], MD  aspirin  EC 81 MG tablet Take 1 tablet (81 mg total) by mouth daily. Swallow whole. 10/21/22   Darliss Rogue, MD  clonazePAM  (KLONOPIN ) 0.5 MG tablet Take 1 tablet (0.5 mg total) by mouth 2 (two) times daily as needed for anxiety. 06/09/22   [name], MD  dicyclomine  (BENTYL ) 10 MG capsule TAKE 1 CAPSULE (10 MG TOTAL) BY MOUTH 4 TIMES A DAY BEFORE MEALS AND AT BEDTIME 11/17/22   [name], MD  FARXIGA 10 MG TABS tablet Take 10 mg by mouth daily. 09/23/21   [provider]  losartan  (COZAAR ) 25 MG tablet Take 1 tablet (25 mg total) by mouth at bedtime. 09/28/23   [name], MD  lovastatin  (MEVACOR ) 40 MG  tablet TAKE 1 TABLET BY MOUTH EVERY DAY 10/23/23   [name], MD  metFORMIN  (GLUCOPHAGE ) 500 MG tablet Take 1 tablet (500 mg total) by mouth every evening. 11/17/22   [name], MD  omeprazole  (PRILOSEC) 20 MG capsule TAKE 1 CAPSULE BY MOUTH EVERY DAY 06/24/23   [name], MD  ondansetron  (ZOFRAN ) 4 MG tablet TAKE 1 TABLET BY MOUTH EVERY 8 HOURS AS NEEDED FOR NAUSEA AND VOMITING 09/08/23   [name], MD  QUEtiapine  (SEROQUEL ) 200 MG tablet TAKE 1 TABLET BY MOUTH TWICE A DAY 03/18/23   [name], MD  sodium bicarbonate  650 MG tablet TAKE 2 TABLETS (1,300 MG TOTAL) BY MOUTH 2 (TWO) TIMES DAILY. 10/19/23   [name], MD  spironolactone  (ALDACTONE ) 50 MG tablet Take 1 tablet (50 mg total) by mouth 2 (two) times daily. 11/17/22   [name], MD  tirzepatide  (MOUNJARO ) 7.5 MG/0.5ML Pen Inject 7.5 mg  into the skin once a week. 10/28/23   [name], MD    Family History Family History  Problem Relation Age of Onset   Diabetes Mother        type 1   Heart disease Mother    Depression Mother    Anxiety disorder Mother    Prostate cancer Father    Bipolar disorder Father    Gout Father    Hypertension Father    Anxiety disorder Sister    Breast cancer Neg Hx     Social History Social History   Tobacco Use   Smoking status: Never   Smokeless tobacco: Never  Vaping Use   Vaping status: Never Used  Substance Use Topics   Alcohol use: No    Alcohol/week: 0.0 standard drinks of alcohol   Drug use: No     Allergies   Patient has no known allergies.   Review of Systems Review of Systems  Genitourinary:  Positive for dysuria and frequency.  Musculoskeletal:  Positive for back pain.     Physical Exam Triage Vital Signs ED Triage Vitals  Encounter Vitals Group     BP 11/11/23 1049 104/72     Girls Systolic BP Percentile --      Girls Diastolic BP Percentile --      Boys Systolic BP Percentile --      Boys Diastolic BP Percentile --      Pulse Rate 11/11/23 1049 (!) 105     Resp 11/11/23 1049 18     Temp 11/11/23 1049 97.9 F (36.6 C)     Temp Source 11/11/23 1049 Oral     SpO2 11/11/23 1049 97 %     Weight --      Height --      Head Circumference --      Peak Flow --      Pain Score 11/11/23 1053 4     Pain Loc --      Pain Education --      Exclude from Growth Chart --    No data found.  Updated Vital Signs BP 104/72 (BP Location: Left Arm)   Pulse (!) 105   Temp 97.9 F (36.6 C) (Oral)   Resp 18   LMP  (LMP Unknown) Comment: postmenapausal  SpO2 97%   Visual Acuity Right Eye Distance:   Left Eye Distance:   Bilateral Distance:    Right Eye Near:   Left Eye Near:    Bilateral Near:  Physical Exam Constitutional:      Appearance: Normal appearance.  Eyes:     Extraocular Movements: Extraocular movements intact.  Pulmonary:      Effort: Pulmonary effort is normal.  Abdominal:     Tenderness: There is no abdominal tenderness. There is no right CVA tenderness, left CVA tenderness or guarding.  Neurological:     Mental Status: She is alert and oriented to person, place, and time.      UC Treatments / Results  Labs (all labs ordered are listed, but only abnormal results are displayed) Labs Reviewed  POCT URINE DIPSTICK - Abnormal; Notable for the following components:      Result Value   Glucose, UA =500 (*)    Blood, UA moderate (*)    POC PROTEIN,UA =30 (*)    Nitrite, UA Positive (*)    Leukocytes, UA Small (1+) (*)    All other components within normal limits  URINE CULTURE    EKG   Radiology No results found.  Procedures Procedures (including critical care time)  Medications Ordered in UC Medications - No data to display  Initial Impression / Assessment and Plan / UC Course  I have reviewed the triage vital signs and the nursing notes.  Pertinent labs & imaging results that were available during my care of the patient were reviewed by me and considered in my medical decision making (see chart for details).  Acute cystitis without hematuria, dysuria  Urinalysis showing leukocytes and nitrates, sent for culture, initiating cephalexin , history of Klebsiella pneumoniae and E. coli present in urine cultures per chart review, discussed this with patient, recommended over-the-counter medications and nonpharmacological supportive care and advised follow-up as needed Final Clinical Impressions(s) / UC Diagnoses   Final diagnoses:  Dysuria  Acute cystitis without hematuria     Discharge Instructions      Your urinalysis shows Sammie Denner blood cells and nitrates which are indicative of infection, your urine will be sent to the lab to determine exactly which bacteria is present, if any changes need to be made to your medications you will be notified  Begin use of cephalexin  every 8 hours for 5  days  You may use over-the-counter Azo to help minimize your symptoms until antibiotic removes bacteria, this medication will turn your urine orange  Increase your fluid intake through use of water  As always practice good hygiene, wiping front to back and avoidance of scented vaginal products to prevent further irritation  If symptoms continue to persist after use of medication or recur please follow-up with urgent care or your primary doctor as needed    ED Prescriptions     Medication Sig Dispense Auth. Provider   cephALEXin  (KEFLEX ) 500 MG capsule Take 1 capsule (500 mg total) by mouth 3 (three) times daily for 5 days. 15 capsule Zaydan Papesh R, NP      PDMP not reviewed this encounter.   Teresa Shelba SAUNDERS, NP 11/11/23 1110

## 2023-11-11 NOTE — Telephone Encounter (Signed)
 FYI Only or Action Required?: FYI only for provider.  Patient was last seen in primary care on 10/28/2023 by [name], MD.  Called Nurse Triage reporting urinary burning.  Symptoms began 3 days.  Interventions attempted: Nothing.  Symptoms are: gradually worsening.  Triage Disposition: See HCP Within 4 Hours (Or PCP Triage)  Patient/caregiver understands and will follow disposition?: yes No openings in office            Copied from CRM #[id number]. Topic: Clinical - Red Word Triage >> Nov 11, 2023  9:10 AM Ivette P wrote: [facility] that prompted transfer to Nurse Triage: Lower back pain, burning with urination, UTI symptoms Reason for Disposition  Side (flank) or lower back pain present  Answer Assessment - Initial Assessment Questions 1. SEVERITY: How bad is the pain?  (e.g., Scale 1-10; mild, moderate, or severe)     6/10 2. FREQUENCY: How many times have you had painful urination today?      1 3. PATTERN: Is pain present every time you urinate or just sometimes?      sometimes 4. ONSET: When did the painful urination start?      3 days  5. FEVER: Do you have a fever? If Yes, ask: What is your temperature, how was it measured, and when did it start?     Low grade fever last night   7. CAUSE: What do you think is causing the painful urination?  (e.g., UTI, scratch, Herpes sore)     UTI 8. OTHER SYMPTOMS: Do you have any other symptoms? (e.g., blood in urine, flank pain, genital sores, urgency, vaginal discharge)     Flank pain  Protocols used: Urination Pain - Female-A-AH

## 2023-11-11 NOTE — ED Triage Notes (Signed)
 Patient complains urinary frequency, painful urination, chills and left lower back pain x 3 days.  Took 500 mg Tylenol  at 2:00 am with mild relief. Patient took AZO standard on Wednesday with mild relief. Rates pain  4/10.

## 2023-11-11 NOTE — Telephone Encounter (Signed)
 Pharmacy Patient Advocate Encounter   Received notification from Onbase that prior authorization for Mounjaro  5MG /0.5ML auto-injectors is due for renewal.   Insurance verification completed.   The patient is insured through [employer].  Action: Medication is now available without a prior authorization.

## 2023-11-11 NOTE — Discharge Instructions (Signed)
Your urinalysis shows Kim Obrien blood cells and nitrates which are indicative of infection, your urine will be sent to the lab to determine exactly which bacteria is present, if any changes need to be made to your medications you will be notified  Begin use of cephalexin every 8 hours for 5 days  You may use over-the-counter Azo to help minimize your symptoms until antibiotic removes bacteria, this medication will turn your urine orange  Increase your fluid intake through use of water  As always practice good hygiene, wiping front to back and avoidance of scented vaginal products to prevent further irritation  If symptoms continue to persist after use of medication or recur please follow-up with urgent care or your primary doctor as needed

## 2023-11-13 LAB — URINE CULTURE: Culture: >=100000 — AB

## 2023-11-14 ENCOUNTER — Ambulatory Visit (HOSPITAL_COMMUNITY): Payer: Self-pay

## 2023-11-22 ENCOUNTER — Ambulatory Visit: Payer: Self-pay | Admitting: Family Medicine

## 2023-11-22 LAB — LIPID PANEL
Chol/HDL Ratio: 3.9 ratio (ref 0.0–4.4)
Cholesterol, Total: 157 mg/dL (ref 100–199)
HDL: 40 mg/dL (ref >39)
LDL Chol Calc (NIH): 73 mg/dL (ref 0–99)
Triglycerides: 275 mg/dL — ABNORMAL HIGH (ref 0–149)
VLDL Cholesterol Cal: 44 mg/dL — ABNORMAL HIGH (ref 5–40)

## 2023-11-23 ENCOUNTER — Other Ambulatory Visit: Payer: Self-pay | Admitting: Family Medicine

## 2023-11-23 DIAGNOSIS — N1832 Chronic kidney disease, stage 3b: Secondary | ICD-10-CM

## 2023-11-24 ENCOUNTER — Other Ambulatory Visit: Payer: Self-pay | Admitting: Family Medicine

## 2023-11-24 DIAGNOSIS — N1832 Chronic kidney disease, stage 3b: Secondary | ICD-10-CM

## 2023-11-24 NOTE — Telephone Encounter (Signed)
 Copied from CRM [id number]. Topic: Clinical - Medication Refill >> Nov 24, 2023  9:54 AM Tobias L wrote: Medication: tirzepatide  (MOUNJARO ) 7.5 MG/0.5ML Pen Patient states she was advised to call for refill once she is out, patient called pharmacy for refill but her request was denied. Patient states she is completely out now and requesting refill to pharmacy below.   Has the patient contacted their pharmacy? Yes Requested refill through pharmacy and was told it was denied. Reason refill requested to soon   This is the patient's preferred pharmacy:  [id number] - [address] [place] [address] Phone: [phone] Fax: [phone]  Is this the correct pharmacy for this prescription? Yes  Has the prescription been filled recently? No  Is the patient out of the medication? Yes  Has the patient been seen for an appointment in the last year OR does the patient have an upcoming appointment? Yes  Can we respond through MyChart? Yes  Agent: Please be advised that Rx refills may take up to 3 business days. We ask that you follow-up with your pharmacy.

## 2023-11-25 ENCOUNTER — Ambulatory Visit: Payer: Self-pay

## 2023-11-25 NOTE — Telephone Encounter (Signed)
 May send in prescription for cephalexin  500mg  three times daily for 5 days. Go to urgent care if not rapidly improving.

## 2023-11-25 NOTE — Telephone Encounter (Signed)
 FYI Only or Action Required?: Action required by provider: would like an antibiotic called in for recurrent UTI.  States she is on vacation and will not be back for 5 days.  Patient was last seen in primary care on 10/28/2023 by Kim Nancyann BRAVO, MD.  Called Nurse Triage reporting burning with urination.  Symptoms began several weeks ago.  Interventions attempted: Nothing.  Symptoms are: gradually worsening.  Triage Disposition: See Physician Within 24 Hours  Patient/caregiver understands and will follow disposition?: No, wishes to speak with PCP     Copied from CRM #[id number]. Topic: Clinical - Red Word Triage >> Nov 25, 2023 11:00 AM Charlet HERO wrote: Red Word that prompted transfer to Nurse Triage: patient is calling about uti she had back on 08/05 she got the script for anitbiotics and she took them all but it feels like its coming back she has burning when urinating, pain in lower back also low grade fever. She would like a script called in bc she is out of town. Reason for Disposition  Age > 50 years  Answer Assessment - Initial Assessment Questions 1. SEVERITY: How bad is the pain?  (e.g., Scale 1-10; mild, moderate, or severe)     moderate 2. FREQUENCY: How many times have you had painful urination today?      everytime 3. PATTERN: Is pain present every time you urinate or just sometimes?      everytime 4. ONSET: When did the painful urination start?      Was seen in UC on 8/1 and was dx with UTI 5. FEVER: Do you have a fever? If Yes, ask: What is your temperature, how was it measured, and when did it start?     Yes, states low grade 6. PAST UTI: Have you had a urine infection before? If Yes, ask: When was the last time? and What happened that time?      yes 7. CAUSE: What do you think is causing the painful urination?  (e.g., UTI, scratch, Herpes sore)     UTI 8. OTHER SYMPTOMS: Do you have any other symptoms? (e.g., blood in urine, flank pain,  genital sores, urgency, vaginal discharge)     Lower back pain,  9. PREGNANCY: Is there any chance you are pregnant? When was your last menstrual period?     na  Protocols used: Urination Pain - Female-A-AH

## 2023-11-28 NOTE — Telephone Encounter (Signed)
 Requested medication (s) are due for refill today: no  Requested medication (s) are on the active medication list: yes  Last refill:  10/28/23 6 ml 3 RF  Future visit scheduled: yes  Notes to clinic:  med not assigned to a protocol   Requested Prescriptions  Pending Prescriptions Disp Refills   tirzepatide  (MOUNJARO ) 7.5 MG/0.5ML Pen 6 mL 3    Sig: Inject 7.5 mg into the skin once a week.     Off-Protocol Failed - 11/28/2023  1:02 PM      Failed - Medication not assigned to a protocol, review manually.      Passed - Valid encounter within last 12 months    Recent Outpatient Visits           1 month ago Type 2 diabetes mellitus with stage 3b chronic kidney disease, without long-term current use of insulin  [facility])   [facility] [name], MD   6 months ago Type 2 diabetes mellitus with stage 3b chronic kidney disease, without long-term current use of insulin  [facility])   [place] [facility] [name], MD

## 2023-11-29 ENCOUNTER — Encounter: Payer: Self-pay | Admitting: Family Medicine

## 2023-11-29 ENCOUNTER — Telehealth: Payer: Self-pay

## 2023-11-29 MED ORDER — CEPHALEXIN 500 MG PO CAPS: 500.0000 mg | ORAL_CAPSULE | Freq: Three times a day (TID) | ORAL | 0 refills | Status: DC

## 2023-11-29 NOTE — Telephone Encounter (Signed)
 Patient called back and E2C2 started another message.  Rx has been sent in

## 2023-11-29 NOTE — Telephone Encounter (Signed)
 Copied from CRM #[id number]. Topic: Clinical - Medication Question >> Nov 29, 2023  9:14 AM Chasity T wrote: Reason for CRM: Patient is calling in because she was advised an antibiotic was supposed to be sent to the pharmacy. I advised her the reason why it didn't get sent because we had no pharmacy for the area she was in. She states she is now home and [facility]. She is also needing a refill for the tirzepatide  (MOUNJARO ) 7.5 MG/0.5ML Pen because it got denied, she states that she has taken her last one and needs to start back tomorrow on taking it.

## 2023-11-30 NOTE — Telephone Encounter (Signed)
 Please see the message below.

## 2023-11-30 NOTE — Telephone Encounter (Signed)
 Copied from CRM [id number]. Topic: Clinical - Medication Prior Auth >> Nov 30, 2023  9:35 AM Larissa RAMAN wrote: Reason for CRM: Patient states her tirzepatide  (MOUNJARO ) 7.5 MG/0.5ML Pen needs Prior authorization

## 2023-12-02 ENCOUNTER — Other Ambulatory Visit (HOSPITAL_COMMUNITY): Payer: Self-pay

## 2023-12-02 NOTE — Telephone Encounter (Signed)
 Pharmacy Patient Advocate Encounter   Received notification from Pt Calls Messages that prior authorization for Mounjaro  7.5MG /0.5ML auto-injectors is required/requested.   Insurance verification completed.   The patient is insured through [employer] .   Per test claim: PA required; PA started via CoverMyMeds. KEY [id number] . Waiting for clinical questions to populate.

## 2023-12-02 NOTE — Telephone Encounter (Signed)
PA has been submitted, thank you

## 2023-12-02 NOTE — Telephone Encounter (Signed)
 Clinical questions populated and answered, PA currently pending.

## 2023-12-05 ENCOUNTER — Other Ambulatory Visit (HOSPITAL_COMMUNITY): Payer: Self-pay

## 2023-12-06 ENCOUNTER — Telehealth: Payer: Self-pay | Admitting: Pharmacist

## 2023-12-06 NOTE — Telephone Encounter (Signed)
 PA denied for insufficient evidence that Mounjaro  has been effective, but therapy has been successful in tighter A1c control. Resubmission attempted, appeal required. Thank you!

## 2023-12-06 NOTE — Telephone Encounter (Signed)
 Appeal has been submitted for Mounjaro . Will advise when response is received, please be advised that most companies may take 30 days to make a decision. Appeal letter and supporting documentation was faxed to [phone] on 12/06/2023 @2 :37 pm.  Thank you, Devere Pandy, PharmD Clinical Pharmacist  [place]  Direct Dial: [phone]

## 2023-12-07 ENCOUNTER — Other Ambulatory Visit (HOSPITAL_COMMUNITY): Payer: Self-pay

## 2023-12-13 ENCOUNTER — Other Ambulatory Visit (HOSPITAL_COMMUNITY): Payer: Self-pay

## 2023-12-13 NOTE — Telephone Encounter (Signed)
 Based on pharmacy test claim, the appeal for Mounjaro  has been approved.  Last filled 12/08/2023 for 3 month supply, next fill available 02/14/2024.

## 2024-02-05 LAB — COLOGUARD: COLOGUARD: NEGATIVE

## 2024-02-13 ENCOUNTER — Other Ambulatory Visit: Payer: Self-pay | Admitting: Family Medicine

## 2024-02-13 DIAGNOSIS — R030 Elevated blood-pressure reading, without diagnosis of hypertension: Secondary | ICD-10-CM

## 2024-02-16 ENCOUNTER — Ambulatory Visit: Attending: Cardiology | Admitting: Cardiology

## 2024-02-16 ENCOUNTER — Encounter: Payer: Self-pay | Admitting: Cardiology

## 2024-02-16 VITALS — BP 98/70 | HR 107 | Ht 63.0 in | Wt 209.4 lb

## 2024-02-16 DIAGNOSIS — I2510 Atherosclerotic heart disease of native coronary artery without angina pectoris: Secondary | ICD-10-CM

## 2024-02-16 DIAGNOSIS — Z6837 Body mass index (BMI) 37.0-37.9, adult: Secondary | ICD-10-CM

## 2024-02-16 DIAGNOSIS — I10 Essential (primary) hypertension: Secondary | ICD-10-CM | POA: Diagnosis not present

## 2024-02-16 MED ORDER — SPIRONOLACTONE 25 MG PO TABS: 25.0000 mg | ORAL_TABLET | Freq: Two times a day (BID) | ORAL | Status: DC

## 2024-02-16 NOTE — Progress Notes (Signed)
 Cardiology Office Note:    Date:  02/16/2024   ID:  Kim Obrien, DOB [birth year], MRN [id number]  PCP:  [name], MD   [facility] Providers Cardiologist:  Redell Cave, MD     Referring MD: [name], MD   Chief Complaint  Patient presents with   Follow-up    12 month follow up visit. Patient is doing well on today. Meds reviewed.     History of Present Illness:    Kim Obrien is a 58 y.o. female with a hx of CAD (LAD, Lcx calcifications on chest CT), hypertension, diabetes, CKD 3, presenting for follow-up.    Doing okay, denies chest pain, endorses fatigue, BP has been running low at home with systolics in the 90s.  Denies edema.  Compliant with medications as prescribed.  Prior notes/testing Echo 8/24 EF 60 to 65%  Past Medical History:  Diagnosis Date   Arthritis    Bipolar 1 disorder (HCC)    Diabetes mellitus without complication (HCC)    History of chicken pox     Past Surgical History:  Procedure Laterality Date   ESOPHAGOGASTRODUODENOSCOPY N/A 04/29/2022   Procedure: ESOPHAGOGASTRODUODENOSCOPY (EGD);  Surgeon: [name], DO;  Location: [facility] ENDOSCOPY;  Service: Gastroenterology;  Laterality: N/A;   LAPAROSCOPY Left 11/04/2017   Procedure: operative laparoscopy, aspiration of left ovarian cyst, left salpingo-oopherectomy;  Surgeon: [name], MD;  Location: [facility] ORS;  Service: Gynecology;  Laterality: Left;   pap smear  2008   in [place]    partial wisdom tooth extraction  2004    Current Medications: Current Meds  Medication Sig   allopurinol  (ZYLOPRIM ) 100 MG tablet TAKE 1 TABLET BY MOUTH EVERY DAY   aspirin  EC 81 MG tablet Take 1 tablet (81 mg total) by mouth daily. Swallow whole.   clonazePAM  (KLONOPIN ) 0.5 MG tablet Take 1 tablet (0.5 mg total) by mouth 2 (two) times daily as needed for anxiety.   dicyclomine  (BENTYL ) 10 MG capsule TAKE 1 CAPSULE (10 MG TOTAL) BY MOUTH 4 TIMES A DAY BEFORE MEALS AND  AT BEDTIME   FARXIGA 10 MG TABS tablet Take 10 mg by mouth daily.   losartan  (COZAAR ) 25 MG tablet Take 1 tablet (25 mg total) by mouth at bedtime.   lovastatin  (MEVACOR ) 40 MG tablet TAKE 1 TABLET BY MOUTH EVERY DAY   metFORMIN  (GLUCOPHAGE ) 500 MG tablet Take 1 tablet (500 mg total) by mouth every evening.   omeprazole  (PRILOSEC) 20 MG capsule TAKE 1 CAPSULE BY MOUTH EVERY DAY   ondansetron  (ZOFRAN ) 4 MG tablet TAKE 1 TABLET BY MOUTH EVERY 8 HOURS AS NEEDED FOR NAUSEA AND VOMITING   QUEtiapine  (SEROQUEL ) 200 MG tablet TAKE 1 TABLET BY MOUTH TWICE A DAY   sodium bicarbonate  650 MG tablet TAKE 2 TABLETS (1,300 MG TOTAL) BY MOUTH 2 (TWO) TIMES DAILY.   tirzepatide  (MOUNJARO ) 7.5 MG/0.5ML Pen Inject 7.5 mg into the skin once a week.   [DISCONTINUED] spironolactone  (ALDACTONE ) 50 MG tablet TAKE 1 TABLET BY MOUTH TWICE A DAY     Allergies:   Patient has no known allergies.   Social History   Socioeconomic History   Marital status: Married    Spouse name: Not on file   Number of children: 2   Years of education: Not on file   Highest education level: Associate degree: occupational, scientist, product/process development, or vocational program  Occupational History   Not on file  Tobacco Use   Smoking status: Never  Smokeless tobacco: Never  Vaping Use   Vaping status: Never Used  Substance and Sexual Activity   Alcohol use: No    Alcohol/week: 0.0 standard drinks of alcohol   Drug use: No   Sexual activity: Yes    Birth control/protection: None  Other Topics Concern   Not on file  Social History Narrative   Not on file   Social Drivers of Health   Financial Resource Strain: Low Risk  (09/28/2023)   Overall Financial Resource Strain (CARDIA)    Difficulty of Paying Living Expenses: Not very hard  Food Insecurity: No Food Insecurity (09/28/2023)   Hunger Vital Sign    Worried About Running Out of Food in the Last Year: Never true    Ran Out of Food in the Last Year: Never true  Transportation Needs: No  Transportation Needs (09/28/2023)   PRAPARE - Administrator, Civil Service (Medical): No    Lack of Transportation (Non-Medical): No  Physical Activity: Insufficiently Active (09/28/2023)   Exercise Vital Sign    Days of Exercise per Week: 2 days    Minutes of Exercise per Session: 10 min  Stress: No Stress Concern Present (09/28/2023)   [employer] of Occupational Health - Occupational Stress Questionnaire    Feeling of Stress: Only a little  Social Connections: Moderately Isolated (09/28/2023)   Social Connection and Isolation Panel    Frequency of Communication with Friends and Family: More than three times a week    Frequency of Social Gatherings with Friends and Family: Twice a week    Attends Religious Services: More than 4 times per year    Active Member of [employer] or Organizations: No    Attends Engineer, Structural: Not on file    Marital Status: Separated     Family History: The patient's family history includes Anxiety disorder in her mother and sister; Bipolar disorder in her father; Depression in her mother; Diabetes in her mother; Gout in her father; Heart disease in her mother; Hypertension in her father; Prostate cancer in her father. There is no history of Breast cancer.  ROS:   Please see the history of present illness.     All other systems reviewed and are negative.  EKGs/Labs/Other Studies Reviewed:    The following studies were reviewed today:  EKG Interpretation Date/Time:  Thursday February 16 2024 09:41:45 EST Ventricular Rate:  107 PR Interval:  150 QRS Duration:  82 QT Interval:  334 QTC Calculation: 445 R Axis:   -38  Text Interpretation: Sinus tachycardia Left axis deviation Low voltage QRS Possible Anterolateral infarct (cited on or before 16-Feb-2024) Confirmed by Darliss Rogue ([id number]) on 02/16/2024 10:17:40 AM    Recent Labs: 09/26/2023: BUN 16; Creatinine 1.6; Potassium 4.6; Sodium 139  Recent Lipid Panel     Component Value Date/Time   CHOL 157 11/21/2023 1035   TRIG 275 (H) 11/21/2023 1035   HDL 40 11/21/2023 1035   CHOLHDL 3.9 11/21/2023 1035   LDLCALC 73 11/21/2023 1035     Risk Assessment/Calculations:            Physical Exam:    VS:  BP 98/70   Pulse (!) 107   Ht 5' 3 (1.6 m)   Wt 209 lb 6.4 oz (95 kg)   LMP  (LMP Unknown) Comment: postmenapausal  SpO2 98%   BMI 37.09 kg/m     Wt Readings from Last 3 Encounters:  02/16/24 209 lb 6.4 oz (95 kg)  10/28/23  208 lb 14.4 oz (94.8 kg)  05/27/23 214 lb 4.8 oz (97.2 kg)     GEN:  Well nourished, well developed in no acute distress HEENT: Normal NECK: No JVD; No carotid bruits CARDIAC: RRR, no murmurs, rubs, gallops RESPIRATORY:  Clear to auscultation without rales, wheezing or rhonchi  ABDOMEN: Soft, non-tender, non-distended MUSCULOSKELETAL:  No edema; chest tenderness with palpation. SKIN: Warm and dry NEUROLOGIC:  Alert and oriented x 3 PSYCHIATRIC:  Normal affect   ASSESSMENT:    1. Coronary artery disease involving native coronary artery of native heart without angina pectoris   2. Primary hypertension   3. BMI 37.0-37.9, adult    PLAN:    In order of problems listed above:  CAD, LAD, LCx calcifications on chest CT.  Denies chest pain.  Continue aspirin  81 mg daily, lovastatin  40 mg daily. Hypertension, BP low normal, symptoms of fatigue.  Reduce Aldactone  to 25 mg daily, continue losartan  25 mg daily.  Monitor BP and symptoms.  Low threshold to stop Aldactone  at follow-up visit if BP remains low. Obesity, low-calorie diet.  Follow-up 6 to 8 weeks for BP check         Medication Adjustments/Labs and Tests Ordered: Current medicines are reviewed at length with the patient today.  Concerns regarding medicines are outlined above.  Orders Placed This Encounter  Procedures   EKG 12-Lead   Meds ordered this encounter  Medications   spironolactone  (ALDACTONE ) 25 MG tablet    Sig: Take 1 tablet (25 mg  total) by mouth 2 (two) times daily.    Patient Instructions  Medication Instructions:  - REDUCE aldactone  to 25 mg twice daily   *If you need a refill on your cardiac medications before your next appointment, please call your pharmacy*  Lab Work: No labs ordered today  If you have labs (blood work) drawn today and your tests are completely normal, you will receive your results only by: MyChart Message (if you have MyChart) OR A paper copy in the mail If you have any lab test that is abnormal or we need to change your treatment, we will call you to review the results.  Testing/Procedures: No test ordered today   Follow-Up: At [facility], you and your health needs are our priority.  As part of our continuing mission to provide you with exceptional heart care, our providers are all part of one team.  This team includes your primary Cardiologist (physician) and Advanced Practice Providers or APPs (Physician Assistants and Nurse Practitioners) who all work together to provide you with the care you need, when you need it.  Your next appointment:   6 week(s)  Provider:   You may see Redell Cave, MD or one of the following Advanced Practice Providers on your designated Care Team:   Lonni Meager, NP Lesley Maffucci, PA-C Bernardino Bring, PA-C Cadence [place]berg, PA-C Tylene Lunch, NP Barnie Hila, NP    We recommend signing up for the patient portal called MyChart.  Sign up information is provided on this After Visit Summary.  MyChart is used to connect with patients for Virtual Visits (Telemedicine).  Patients are able to view lab/test results, encounter notes, upcoming appointments, etc.  Non-urgent messages can be sent to your provider as well.   To learn more about what you can do with MyChart, go to [email or web address].         Signed, Redell Cave, MD  02/16/2024 11:04 AM    [facility]

## 2024-02-16 NOTE — Patient Instructions (Signed)
 Medication Instructions:  - REDUCE aldactone  to 25 mg twice daily   *If you need a refill on your cardiac medications before your next appointment, please call your pharmacy*  Lab Work: No labs ordered today  If you have labs (blood work) drawn today and your tests are completely normal, you will receive your results only by: MyChart Message (if you have MyChart) OR A paper copy in the mail If you have any lab test that is abnormal or we need to change your treatment, we will call you to review the results.  Testing/Procedures: No test ordered today   Follow-Up: At [facility], you and your health needs are our priority.  As part of our continuing mission to provide you with exceptional heart care, our providers are all part of one team.  This team includes your primary Cardiologist (physician) and Advanced Practice Providers or APPs (Physician Assistants and Nurse Practitioners) who all work together to provide you with the care you need, when you need it.  Your next appointment:   6 week(s)  Provider:   You may see Redell Cave, MD or one of the following Advanced Practice Providers on your designated Care Team:   Lonni Meager, NP Lesley Maffucci, PA-C Bernardino Bring, PA-C Cadence [place], PA-C Tylene Lunch, NP Barnie Hila, NP    We recommend signing up for the patient portal called MyChart.  Sign up information is provided on this After Visit Summary.  MyChart is used to connect with patients for Virtual Visits (Telemedicine).  Patients are able to view lab/test results, encounter notes, upcoming appointments, etc.  Non-urgent messages can be sent to your provider as well.   To learn more about what you can do with MyChart, go to [email or web address].

## 2024-03-02 ENCOUNTER — Encounter: Payer: Self-pay | Admitting: Family Medicine

## 2024-03-02 ENCOUNTER — Ambulatory Visit: Admitting: Family Medicine

## 2024-03-02 VITALS — BP 109/69 | HR 98 | Ht 63.0 in | Wt 209.4 lb

## 2024-03-02 DIAGNOSIS — E1122 Type 2 diabetes mellitus with diabetic chronic kidney disease: Secondary | ICD-10-CM

## 2024-03-02 DIAGNOSIS — Z7985 Long-term (current) use of injectable non-insulin antidiabetic drugs: Secondary | ICD-10-CM

## 2024-03-02 DIAGNOSIS — N1832 Chronic kidney disease, stage 3b: Secondary | ICD-10-CM

## 2024-03-02 LAB — POCT GLYCOSYLATED HEMOGLOBIN (HGB A1C)
Est. average glucose Bld gHb Est-mCnc: 160
Hemoglobin A1C: 7.2 % — AB (ref 4.0–5.6)

## 2024-03-02 MED ORDER — MOUNJARO 7.5 MG/0.5ML ~~LOC~~ SOAJ: SUBCUTANEOUS | Status: DC

## 2024-03-02 NOTE — Progress Notes (Signed)
 Established patient visit   Patient: Kim Obrien   DOB: February 07, 1966   58 y.o. Female  MRN: [id number] Visit Date: 03/02/2024  Today's healthcare provider: Nancyann Perry, MD   Chief Complaint  Patient presents with   Medical Management of Chronic Issues    4 month follow-up T2DM, HTN   Immunizations    Patient declined Shingles, Flu, pneumonia, tdap/td vaccines.   Subjective    Discussed the use of AI scribe software for clinical note transcription with the patient, who gave verbal consent to proceed.  History of Present Illness   Kim Obrien is a 58 year old female with diabetes who presents for a check-up on her blood sugar levels.  Her blood pressure has been low during recent visits to her cardiologist, leading to a reduction in her spironolactone  dosage from one pill to half a pill daily. She monitors her blood pressure at home and notes it has been stable, though she experienced some dizziness and lightheadedness. Her last visit to the cardiologist was two to three weeks ago, and she is scheduled for a follow-up in two to three weeks.  She is currently taking Mounjaro  for her diabetes, but her blood sugar levels have been consistently over 200 mg/dL for the past two to three weeks despite daily monitoring. She experiences mild nausea for about two days after taking the medication. Her A1c was reported to be 7.2 at her most recent lab work. She recently refilled her prescription for Mounjaro .  She had an eye exam about a month ago where she was tested for glaucoma. Initial tests suggested she might have glaucoma, and follow-up tests on November 3rd indicated borderline results. She experiences blurry vision, particularly at night while driving, and is scheduled for another check-up in January to monitor any progression.  She reports mild nausea for about two days after taking Mounjaro , but no other significant side effects were mentioned.     Lab Results  Component  Value Date   HGBA1C 7.2 (A) 03/02/2024   HGBA1C 6.9 (A) 10/28/2023   HGBA1C 6.7 (A) 05/27/2023   Lab Results  Component Value Date   NA 139 09/26/2023   K 4.6 09/26/2023   CREATININE 1.6 (A) 09/26/2023   EGFR 37 09/26/2023   GLUCOSE 233 (H) 10/18/2022     Medications: Outpatient Medications Prior to Visit  Medication Sig   allopurinol  (ZYLOPRIM ) 100 MG tablet TAKE 1 TABLET BY MOUTH EVERY DAY   aspirin  EC 81 MG tablet Take 1 tablet (81 mg total) by mouth daily. Swallow whole.   clonazePAM  (KLONOPIN ) 0.5 MG tablet Take 1 tablet (0.5 mg total) by mouth 2 (two) times daily as needed for anxiety.   dicyclomine  (BENTYL ) 10 MG capsule TAKE 1 CAPSULE (10 MG TOTAL) BY MOUTH 4 TIMES A DAY BEFORE MEALS AND AT BEDTIME   FARXIGA 10 MG TABS tablet Take 10 mg by mouth daily.   losartan  (COZAAR ) 25 MG tablet Take 1 tablet (25 mg total) by mouth at bedtime.   lovastatin  (MEVACOR ) 40 MG tablet TAKE 1 TABLET BY MOUTH EVERY DAY   metFORMIN  (GLUCOPHAGE ) 500 MG tablet Take 1 tablet (500 mg total) by mouth every evening.   omeprazole  (PRILOSEC) 20 MG capsule TAKE 1 CAPSULE BY MOUTH EVERY DAY   ondansetron  (ZOFRAN ) 4 MG tablet TAKE 1 TABLET BY MOUTH EVERY 8 HOURS AS NEEDED FOR NAUSEA AND VOMITING   QUEtiapine  (SEROQUEL ) 200 MG tablet TAKE 1 TABLET BY MOUTH TWICE A DAY  sodium bicarbonate  650 MG tablet TAKE 2 TABLETS (1,300 MG TOTAL) BY MOUTH 2 (TWO) TIMES DAILY.   spironolactone  (ALDACTONE ) 25 MG tablet Take 1 tablet (25 mg total) by mouth 2 (two) times daily. (Patient taking differently: Take 1/2 of 50mg  tablet daily)   tirzepatide  (MOUNJARO ) 7.5 MG/0.5ML Pen Inject 7.5 mg into the skin once a week.   No facility-administered medications prior to visit.   Review of Systems  Constitutional:  Negative for appetite change, chills, fatigue and fever.  Respiratory:  Negative for chest tightness and shortness of breath.   Cardiovascular:  Negative for chest pain and palpitations.  Gastrointestinal:   Negative for abdominal pain, nausea and vomiting.  Neurological:  Negative for dizziness and weakness.       Objective    BP 109/69 (BP Location: Right Arm, Patient Position: Sitting)   Pulse 98   Ht 5' 3 (1.6 m)   Wt 209 lb 6.4 oz (95 kg)   LMP  (LMP Unknown) Comment: postmenapausal  SpO2 99%   BMI 37.09 kg/m   Physical Exam   General appearance: Obese female, cooperative and in no acute distress Head: Normocephalic, without obvious abnormality, atraumatic Respiratory: Respirations even and unlabored, normal respiratory rate Extremities: All extremities are intact.  Skin: Skin color, texture, turgor normal. No rashes seen  Psych: Appropriate mood and affect. Neurologic: Mental status: Alert, oriented to person, place, and time, thought content appropriate.   Results for orders placed or performed in visit on 03/02/24  POCT glycosylated hemoglobin (Hb A1C)  Result Value Ref Range   Hemoglobin A1C 7.2 (A) 4.0 - 5.6 %   Est. average glucose Bld gHb Est-mCnc 160      Assessment & Plan        Type 2 diabetes mellitus with diabetic chronic kidney disease Blood glucose levels elevated, A1c at 7.2%. Mounjaro  dosing adjustment needed. Mild nausea post-injection. Insurance requires current prescription completion before dose increase. - Adjust Mounjaro  to one injection every five days until current prescription is exhausted. - Plan to increase Mounjaro  to 10 mg once weekly after current prescription is finished in two months. - Continue Farxiga as prescribed.  Hypertension Blood pressure low, spironolactone  reduced. Home monitoring shows improvement, but dizziness persists. Cardiologist follow-up scheduled. - Continue spironolactone  at half a pill daily. - Monitor blood pressure at home regularly. - Follow up with cardiologist in two to three weeks.    Return in about 4 months (around 06/30/2024) for Diabetes.     Nancyann Perry, MD  [facility] [phone] (phone) [phone] (fax)  [facility]

## 2024-03-02 NOTE — Patient Instructions (Signed)
 SABRA  Please review the attached list of medications and notify my office if there are any errors.   . Please bring all of your medications to every appointment so we can make sure that our medication list is the same as yours.

## 2024-03-07 ENCOUNTER — Other Ambulatory Visit: Payer: Self-pay | Admitting: Family Medicine

## 2024-03-07 DIAGNOSIS — M109 Gout, unspecified: Secondary | ICD-10-CM

## 2024-03-14 ENCOUNTER — Encounter: Payer: Self-pay | Admitting: Family Medicine

## 2024-03-15 ENCOUNTER — Other Ambulatory Visit: Payer: Self-pay

## 2024-03-15 DIAGNOSIS — E1122 Type 2 diabetes mellitus with diabetic chronic kidney disease: Secondary | ICD-10-CM

## 2024-03-15 DIAGNOSIS — K2960 Other gastritis without bleeding: Secondary | ICD-10-CM

## 2024-03-15 DIAGNOSIS — F3178 Bipolar disorder, in full remission, most recent episode mixed: Secondary | ICD-10-CM

## 2024-03-15 MED ORDER — OMEPRAZOLE 20 MG PO CPDR: 20.0000 mg | DELAYED_RELEASE_CAPSULE | Freq: Every day | ORAL | 0 refills | Status: AC

## 2024-03-15 MED ORDER — METFORMIN HCL 500 MG PO TABS: 500.0000 mg | ORAL_TABLET | Freq: Every evening | ORAL | 0 refills | Status: AC

## 2024-03-15 NOTE — Telephone Encounter (Signed)
 Converted to rx request

## 2024-03-15 NOTE — Telephone Encounter (Signed)
 LOV- 03/02/2024 NOV- 07/02/2024 LRF- 03/18/2023 Outpatient Medication Detail   Disp Refills Start End   QUEtiapine  (SEROQUEL ) 200 MG tablet 60 tablet 12 03/18/2023 --   Sig - Route: TAKE 1 TABLET BY MOUTH TWICE A DAY - Oral   Sent to pharmacy as: QUEtiapine  (SEROQUEL ) 200 MG tablet   E-Prescribing Status: Receipt confirmed by pharmacy (03/18/2023 12:32 PM EST)     Wants it sent to [facility].   09/08/2023  Outpatient Medication Detail   Disp Refills Start End   ondansetron  (ZOFRAN ) 4 MG tablet 20 tablet 3 09/08/2023 --   Sig: TAKE 1 TABLET BY MOUTH EVERY 8 HOURS AS NEEDED FOR NAUSEA AND VOMITING   Sent to pharmacy as: ondansetron  (ZOFRAN ) 4 MG tablet   E-Prescribing Status: Receipt confirmed by pharmacy (09/08/2023  1:21 PM EDT)     [facility] for this one.

## 2024-03-15 NOTE — Telephone Encounter (Signed)
 Converted to Rx request, sent rx for Metformin  and Omeprazole .  Quetiapine  fumarate and Zofran  was sent to provider for refills.

## 2024-03-16 MED ORDER — ONDANSETRON HCL 4 MG PO TABS: 4.0000 mg | ORAL_TABLET | ORAL | 3 refills | Status: AC | PRN

## 2024-03-16 MED ORDER — QUETIAPINE FUMARATE 200 MG PO TABS: 200.0000 mg | ORAL_TABLET | Freq: Two times a day (BID) | ORAL | 12 refills | Status: DC

## 2024-03-22 NOTE — Progress Notes (Unsigned)
 Cardiology Office Note    Date:  03/29/2024   ID:  Kim Obrien, DOB 07/20/1965, MRN [id number]  PCP:  Kim Nancyann BRAVO, MD  Cardiologist:  Redell Cave, MD  Electrophysiologist:  None   Chief Complaint: Follow-up  History of Present Illness:   Kim Obrien is a 58 y.o. female with history of coronary artery calcification, DM2, CKD stage IIIb, HTN, HLD, and obesity who presents for follow-up of blood pressure.  She was evaluated by Dr. Cave in 10/2022 for ED follow-up of chest pain in 10/2022, at which time high-sensitivity troponin was negative x 1.  CTA of the chest was negative for PE with coronary artery calcification noted in the LAD and LCx territories.  Chest pain was felt to be pleuritic in nature and had improved upon cardiology evaluation.  It was recommended that she undergo echo and a Lexiscan MPI.  Echo in 11/2022 showed an EF of 60 to 65%, no regional wall motion abnormalities, grade 1 diastolic dysfunction, normal RV systolic function and ventricular cavity size, mild mitral regurgitation, and a normal CVP.  Lexiscan MPI was denied by insurance.  She followed up with Dr. Cave in 12/2022 and was doing well from a cardiac perspective no further testing pursued at that time.  She was most recently seen in the office in 02/2024 noting fatigue with blood pressure in the 90s mmHg systolic.  It was recommended spironolactone  be reduced to 25 mg daily with continuation of losartan  25 mg and follow-up today.  She comes in doing very well from a cardiac perspective and is without symptoms of angina or cardiac decompensation.  No dyspnea, palpitations, dizziness, recent MI, or syncope.  Blood pressure at home has been in the low 100s mmHg systolic and she is asymptomatic with this.  She does notice an increase in her functional status and improvement in fatigue following reduction of spironolactone .  No lower extremity swelling or progressive orthopnea.  No falls or  symptoms concerning for bleeding.  Never a smoker.  Reports her mother has a history of 3 MIs with her first being in her 11s (she was a smoker).  She does not have any acute cardiac concerns at this time.   Labs independently reviewed: 02/2024 - A1c 7.2 01/2024 - Hgb 14.5, PLT 185, BUN 17, serum creatinine 1.59, potassium 4.4, albumin 4.5 11/2023 - TC 157, TG 275, HDL 40, LDL 73 10/2022 - TSH normal  Past Medical History:  Diagnosis Date   Arthritis    Bipolar 1 disorder (HCC)    Chronic kidney disease (CKD), stage III (moderate) (HCC)    Coronary artery calcification    Diabetes mellitus without complication (HCC)    Essential hypertension    History of chicken pox    Hyperlipidemia LDL goal <70     Past Surgical History:  Procedure Laterality Date   ESOPHAGOGASTRODUODENOSCOPY N/A 04/29/2022   Procedure: ESOPHAGOGASTRODUODENOSCOPY (EGD);  Surgeon: Kim Elspeth Sharper, DO;  Location: [facility] ENDOSCOPY;  Service: Gastroenterology;  Laterality: N/A;   LAPAROSCOPY Left 11/04/2017   Procedure: operative laparoscopy, aspiration of left ovarian cyst, left salpingo-oopherectomy;  Surgeon: Kim Garnette BIRCH, MD;  Location: [facility] ORS;  Service: Gynecology;  Laterality: Left;   pap smear  2008   in [place]    partial wisdom tooth extraction  2004    Current Medications: Active Medications[1]  Allergies:   Patient has no known allergies.   Social History   Socioeconomic History   Marital status: Married    Spouse  name: Not on file   Number of children: 2   Years of education: Not on file   Highest education level: Associate degree: occupational, scientist, product/process development, or vocational program  Occupational History   Not on file  Tobacco Use   Smoking status: Never   Smokeless tobacco: Never  Vaping Use   Vaping status: Never Used  Substance and Sexual Activity   Alcohol use: No    Alcohol/week: 0.0 standard drinks of alcohol   Drug use: No   Sexual activity: Yes    Birth control/protection:  None  Other Topics Concern   Not on file  Social History Narrative   Not on file   Social Drivers of Health   Tobacco Use: Low Risk (03/29/2024)   Patient History    Smoking Tobacco Use: Never    Smokeless Tobacco Use: Never    Passive Exposure: Not on file  Financial Resource Strain: Low Risk (09/28/2023)   Overall Financial Resource Strain (CARDIA)    Difficulty of Paying Living Expenses: Not very hard  Food Insecurity: No Food Insecurity (09/28/2023)   Epic    Worried About Programme Researcher, Broadcasting/film/video in the Last Year: Never true    Ran Out of Food in the Last Year: Never true  Transportation Needs: No Transportation Needs (09/28/2023)   Epic    Lack of Transportation (Medical): No    Lack of Transportation (Non-Medical): No  Physical Activity: Insufficiently Active (09/28/2023)   Exercise Vital Sign    Days of Exercise per Week: 2 days    Minutes of Exercise per Session: 10 min  Stress: No Stress Concern Present (09/28/2023)   [employer] of Occupational Health - Occupational Stress Questionnaire    Feeling of Stress: Only a little  Social Connections: Moderately Isolated (09/28/2023)   Social Connection and Isolation Panel    Frequency of Communication with Friends and Family: More than three times a week    Frequency of Social Gatherings with Friends and Family: Twice a week    Attends Religious Services: More than 4 times per year    Active Member of Clubs or Organizations: No    Attends Engineer, Structural: Not on file    Marital Status: Separated  Depression (PHQ2-9): Low Risk (10/28/2023)   Depression (PHQ2-9)    PHQ-2 Score: 0  Alcohol Screen: Low Risk (06/09/2022)   Alcohol Screen    Last Alcohol Screening Score (AUDIT): 0  Housing: Low Risk (09/28/2023)   Epic    Unable to Pay for Housing in the Last Year: No    Number of Times Moved in the Last Year: 0    Homeless in the Last Year: No  Utilities: Not on file  Health Literacy: Not on file     Family  History:  The patient's family history includes Anxiety disorder in her mother and sister; Bipolar disorder in her father; Depression in her mother; Diabetes in her mother; Gout in her father; Heart disease in her mother; Hypertension in her father; Prostate cancer in her father. There is no history of Breast cancer.  ROS:   12-point review of systems is negative unless otherwise noted in the HPI.   EKGs/Labs/Other Studies Reviewed:    Studies reviewed were summarized above. The additional studies were reviewed today:  2D echo 11/23/2022: 1. Left ventricular ejection fraction, by estimation, is 60 to 65%. The  left ventricle has normal function. The left ventricle has no regional  wall motion abnormalities. Left ventricular diastolic parameters are  consistent with Grade I diastolic  dysfunction (impaired relaxation).   2. Right ventricular systolic function is normal. The right ventricular  size is normal. Tricuspid regurgitation signal is inadequate for assessing  PA pressure.   3. The mitral valve is normal in structure. Mild mitral valve  regurgitation. No evidence of mitral stenosis.   4. The aortic valve is normal in structure. Aortic valve regurgitation is  not visualized. No aortic stenosis is present.   5. The inferior vena cava is normal in size with greater than 50%  respiratory variability, suggesting right atrial pressure of 3 mmHg.    EKG:  EKG is not ordered today.    Recent Labs: 09/26/2023: BUN 16; Creatinine 1.6; Potassium 4.6; Sodium 139  Recent Lipid Panel    Component Value Date/Time   CHOL 157 11/21/2023 1035   TRIG 275 (H) 11/21/2023 1035   HDL 40 11/21/2023 1035   CHOLHDL 3.9 11/21/2023 1035   LDLCALC 73 11/21/2023 1035    PHYSICAL EXAM:    VS:  BP 106/78 (BP Location: Left Arm, Patient Position: Sitting, Cuff Size: Large)   Pulse 90   Ht 5' 2.5 (1.588 m)   Wt 206 lb (93.4 kg)   LMP  (LMP Unknown) Comment: postmenapausal  SpO2 98%   BMI 37.08  kg/m   BMI: Body mass index is 37.08 kg/m.  Physical Exam Vitals reviewed.  Constitutional:      Appearance: She is well-developed.  HENT:     Head: Normocephalic and atraumatic.  Eyes:     General:        Right eye: No discharge.        Left eye: No discharge.  Cardiovascular:     Rate and Rhythm: Normal rate and regular rhythm.     Heart sounds: Normal heart sounds, S1 normal and S2 normal. Heart sounds not distant. No midsystolic click and no opening snap. No murmur heard.    No friction rub.  Pulmonary:     Effort: Pulmonary effort is normal. No respiratory distress.     Breath sounds: Normal breath sounds. No decreased breath sounds, wheezing, rhonchi or rales.  Musculoskeletal:     Cervical back: Normal range of motion.  Skin:    General: Skin is warm and dry.     Nails: There is no clubbing.  Neurological:     Mental Status: She is alert and oriented to person, place, and time.  Psychiatric:        Speech: Speech normal.        Behavior: Behavior normal.        Thought Content: Thought content normal.        Judgment: Judgment normal.     Wt Readings from Last 3 Encounters:  03/29/24 206 lb (93.4 kg)  03/02/24 209 lb 6.4 oz (95 kg)  02/16/24 209 lb 6.4 oz (95 kg)     ASSESSMENT & PLAN:   HTN: Blood pressure is well-controlled in the office.  She notes an improvement in fatigue and functional status following dose reduction of spironolactone  at 25 mg.  She will otherwise continue losartan  25 mg.  Recent labs stable.  Moving forward, should her blood pressure run low again, or if she has progressive renal dysfunction, would discontinue spironolactone .  Coronary artery calcification with history of chest pain: Prior CT imaging has demonstrated coronary calcification in the LAD and LCx distribution.  Patient has multiple risk factors including known coronary artery calcification, DM2, HTN, HLD, and obesity.  10-year  ASCVD risk 4.5%.  No symptoms concerning for angina  or cardiac decompensation.  Previously recommended to undergo nuclear stress test in the summer 2024, though insurance denied to this.  She has been without symptoms of chest pain since.  We will obtain a coronary calcium score for further risk stratification.  She is aware calcium score is self-pay.  HLD: LDL 73 in 11/2023.  Target LDL less than 70 with coronary calcification noted in the LAD and LCx distribution.  Currently on lovastatin  40 mg.  Pending calcium score consider transitioning to higher intensity statin for further CV risk reduction.  DM2: A1c 7.2 in 02/2024.  Management per PCP  CKD stage IIIb: Renal function stable in 01/2024.  Avoid nephrotoxic agents.  Followed by PCP.  Obesity: Heart healthy diet and active lifestyle recommended.  On GLP-1 therapy which is managed by PCP.    Disposition: F/u with Dr. Darliss or an APP in 2 months.   Medication Adjustments/Labs and Tests Ordered: Current medicines are reviewed at length with the patient today.  Concerns regarding medicines are outlined above. Medication changes, Labs and Tests ordered today are summarized above and listed in the Patient Instructions accessible in Encounters.   Signed, Bernardino Bring, PA-C 03/29/2024 12:02 PM     [facility] [place] [address] [place], [place] [address] [phone]     [1]  Current Meds  Medication Sig   allopurinol  (ZYLOPRIM ) 100 MG tablet TAKE 1 TABLET BY MOUTH EVERY DAY   aspirin  EC 81 MG tablet Take 1 tablet (81 mg total) by mouth daily. Swallow whole.   clonazePAM  (KLONOPIN ) 0.5 MG tablet Take 1 tablet (0.5 mg total) by mouth 2 (two) times daily as needed for anxiety.   dicyclomine  (BENTYL ) 10 MG capsule TAKE 1 CAPSULE (10 MG TOTAL) BY MOUTH 4 TIMES A DAY BEFORE MEALS AND AT BEDTIME   FARXIGA 10 MG TABS tablet Take 10 mg by mouth daily.   losartan  (COZAAR ) 25 MG tablet Take 1 tablet (25 mg total) by mouth at bedtime.   lovastatin  (MEVACOR ) 40  MG tablet TAKE 1 TABLET BY MOUTH EVERY DAY   metFORMIN  (GLUCOPHAGE ) 500 MG tablet Take 1 tablet (500 mg total) by mouth every evening.   omeprazole  (PRILOSEC) 20 MG capsule Take 1 capsule (20 mg total) by mouth daily.   ondansetron  (ZOFRAN ) 4 MG tablet Take 1 tablet (4 mg total) by mouth as needed for nausea or vomiting.   QUEtiapine  (SEROQUEL ) 200 MG tablet Take 1 tablet (200 mg total) by mouth 2 (two) times daily.   sodium bicarbonate  650 MG tablet TAKE 2 TABLETS (1,300 MG TOTAL) BY MOUTH 2 (TWO) TIMES DAILY.   spironolactone  (ALDACTONE ) 50 MG tablet Take 25 mg by mouth daily.   tirzepatide  (MOUNJARO ) 7.5 MG/0.5ML Pen Inject 7.5 mg every 5 days (Patient taking differently: 10 mg. Inject 7.5 mg every 5 days)

## 2024-03-27 ENCOUNTER — Other Ambulatory Visit: Payer: Self-pay | Admitting: Family Medicine

## 2024-03-27 DIAGNOSIS — Z1231 Encounter for screening mammogram for malignant neoplasm of breast: Secondary | ICD-10-CM

## 2024-03-29 ENCOUNTER — Encounter: Payer: Self-pay | Admitting: Physician Assistant

## 2024-03-29 ENCOUNTER — Ambulatory Visit: Attending: Physician Assistant | Admitting: Physician Assistant

## 2024-03-29 VITALS — BP 106/78 | HR 90 | Ht 62.5 in | Wt 206.0 lb

## 2024-03-29 DIAGNOSIS — N1832 Chronic kidney disease, stage 3b: Secondary | ICD-10-CM | POA: Diagnosis present

## 2024-03-29 DIAGNOSIS — I2510 Atherosclerotic heart disease of native coronary artery without angina pectoris: Secondary | ICD-10-CM | POA: Insufficient documentation

## 2024-03-29 DIAGNOSIS — E119 Type 2 diabetes mellitus without complications: Secondary | ICD-10-CM | POA: Insufficient documentation

## 2024-03-29 DIAGNOSIS — Z6838 Body mass index (BMI) 38.0-38.9, adult: Secondary | ICD-10-CM | POA: Diagnosis present

## 2024-03-29 DIAGNOSIS — E785 Hyperlipidemia, unspecified: Secondary | ICD-10-CM | POA: Insufficient documentation

## 2024-03-29 DIAGNOSIS — I10 Essential (primary) hypertension: Secondary | ICD-10-CM | POA: Diagnosis present

## 2024-03-29 NOTE — Patient Instructions (Signed)
 Medication Instructions:  Your physician recommends that you continue on your current medications as directed. Please refer to the Current Medication list given to you today.    *If you need a refill on your cardiac medications before your next appointment, please call your pharmacy*  Lab Work: No labs ordered today    Testing/Procedures: Your physician has recommended that you have CT Coronary Calcium Score.  - $99 out of pocket cost at the time of your test - Call [phone] to schedule at your convenience.  [facility] [address] [place], [place] [address]   Coronary CalciumScan A coronary calcium scan is an imaging test used to look for deposits of calcium and other fatty materials (plaques) in the inner lining of the blood vessels of the heart (coronary arteries). These deposits of calcium and plaques can partly clog and narrow the coronary arteries without producing any symptoms or warning signs. This puts a person at risk for a heart attack. This test can detect these deposits before symptoms develop. Tell a health care provider about: Any allergies you have. All medicines you are taking, including vitamins, herbs, eye drops, creams, and over-the-counter medicines. Any problems you or family members have had with anesthetic medicines. Any blood disorders you have. Any surgeries you have had. Any medical conditions you have. Whether you are pregnant or may be pregnant. What are the risks? Generally, this is a safe procedure. However, problems may occur, including: Harm to a pregnant woman and her unborn baby. This test involves the use of radiation. Radiation exposure can be dangerous to a pregnant woman and her unborn baby. If you are pregnant, you generally should not have this procedure done. Slight increase in the risk of cancer. This is because of the radiation involved in the test. What happens before the procedure? No  preparation is needed for this procedure. What happens during the procedure? You will undress and remove any jewelry around your neck or chest. You will put on a hospital gown. Sticky electrodes will be placed on your chest. The electrodes will be connected to an electrocardiogram (ECG) machine to record a tracing of the electrical activity of your heart. A CT scanner will take pictures of your heart. During this time, you will be asked to lie still and hold your breath for 2-3 seconds while a picture of your heart is being taken. The procedure may vary among health care providers and hospitals. What happens after the procedure? You can get dressed. You can return to your normal activities. It is up to you to get the results of your test. Ask your health care provider, or the department that is doing the test, when your results will be ready. Summary A coronary calcium scan is an imaging test used to look for deposits of calcium and other fatty materials (plaques) in the inner lining of the blood vessels of the heart (coronary arteries). Generally, this is a safe procedure. Tell your health care provider if you are pregnant or may be pregnant. No preparation is needed for this procedure. A CT scanner will take pictures of your heart. You can return to your normal activities after the scan is done. This information is not intended to replace advice given to you by your health care provider. Make sure you discuss any questions you have with your health care provider. Document Released: 09/25/2007 Document Revised: 02/16/2016 Document Reviewed: 02/16/2016 Elsevier Interactive Patient Education  2017 Elsevier Inc.   Follow-Up:  At [facility], you and your health needs are our priority.  As part of our continuing mission to provide you with exceptional heart care, our providers are all part of one team.  This team includes your primary Cardiologist (physician) and Advanced Practice  Providers or APPs (Physician Assistants and Nurse Practitioners) who all work together to provide you with the care you need, when you need it.  Your next appointment:   1-2 month(s)  Provider:   Bernardino Bring, PA-C

## 2024-04-15 ENCOUNTER — Other Ambulatory Visit: Payer: Self-pay | Admitting: Family Medicine

## 2024-04-15 DIAGNOSIS — F3178 Bipolar disorder, in full remission, most recent episode mixed: Secondary | ICD-10-CM

## 2024-04-22 ENCOUNTER — Other Ambulatory Visit: Payer: Self-pay | Admitting: Family Medicine

## 2024-04-22 ENCOUNTER — Encounter: Payer: Self-pay | Admitting: Family Medicine

## 2024-04-22 DIAGNOSIS — E1122 Type 2 diabetes mellitus with diabetic chronic kidney disease: Secondary | ICD-10-CM

## 2024-04-22 MED ORDER — TIRZEPATIDE 10 MG/0.5ML ~~LOC~~ SOAJ: 10.0000 mg | SUBCUTANEOUS | 2 refills | Status: AC

## 2024-04-23 ENCOUNTER — Other Ambulatory Visit (HOSPITAL_COMMUNITY): Payer: Self-pay

## 2024-04-24 ENCOUNTER — Ambulatory Visit
Admission: RE | Admit: 2024-04-24 | Discharge: 2024-04-24 | Disposition: A | Discharge status: Home / Self Care | Payer: Self-pay | Source: Ambulatory Visit | Attending: Physician Assistant | Admitting: Physician Assistant

## 2024-04-24 ENCOUNTER — Ambulatory Visit: Payer: Self-pay | Admitting: Physician Assistant

## 2024-04-24 DIAGNOSIS — E785 Hyperlipidemia, unspecified: Secondary | ICD-10-CM

## 2024-04-24 DIAGNOSIS — Z79899 Other long term (current) drug therapy: Secondary | ICD-10-CM

## 2024-04-24 DIAGNOSIS — E1169 Type 2 diabetes mellitus with other specified complication: Secondary | ICD-10-CM

## 2024-04-24 DIAGNOSIS — I2510 Atherosclerotic heart disease of native coronary artery without angina pectoris: Secondary | ICD-10-CM | POA: Insufficient documentation

## 2024-04-24 MED ORDER — ATORVASTATIN CALCIUM 40 MG PO TABS: 40.0000 mg | ORAL_TABLET | Freq: Every day | ORAL | 3 refills | Status: AC

## 2024-04-27 ENCOUNTER — Ambulatory Visit
Admission: RE | Admit: 2024-04-27 | Discharge: 2024-04-27 | Disposition: A | Discharge status: Home / Self Care | Source: Ambulatory Visit | Attending: Family Medicine | Admitting: Family Medicine

## 2024-04-27 DIAGNOSIS — Z1231 Encounter for screening mammogram for malignant neoplasm of breast: Secondary | ICD-10-CM | POA: Diagnosis present

## 2024-05-01 ENCOUNTER — Telehealth: Payer: Self-pay

## 2024-05-01 ENCOUNTER — Other Ambulatory Visit (HOSPITAL_COMMUNITY): Payer: Self-pay

## 2024-05-01 NOTE — Telephone Encounter (Signed)
 Office received a fax requesting prior authorization for Mounjaro 

## 2024-05-01 NOTE — Telephone Encounter (Signed)
 Pharmacy Patient Advocate Encounter   Received notification from Physician's Office that prior authorization for Mounjaro  10 mg/0.5 ml pen is required/requested.   Insurance verification completed.   The patient is insured through [employer].   Per test claim: Refill too soon. PA is not needed at this time. Medication was filled 05/01/2024. Next eligible fill date is 05/21/2024.

## 2024-05-08 ENCOUNTER — Ambulatory Visit: Admitting: Physician Assistant

## 2024-05-14 ENCOUNTER — Ambulatory Visit: Admitting: Physician Assistant

## 2024-05-18 ENCOUNTER — Ambulatory Visit: Admitting: Physician Assistant

## 2024-05-18 ENCOUNTER — Encounter: Payer: Self-pay | Admitting: Physician Assistant

## 2024-05-18 VITALS — BP 102/66 | HR 101 | Ht 63.0 in | Wt 206.6 lb

## 2024-05-18 DIAGNOSIS — Z79899 Other long term (current) drug therapy: Secondary | ICD-10-CM

## 2024-05-18 DIAGNOSIS — I2510 Atherosclerotic heart disease of native coronary artery without angina pectoris: Secondary | ICD-10-CM

## 2024-05-18 DIAGNOSIS — I10 Essential (primary) hypertension: Secondary | ICD-10-CM

## 2024-05-18 DIAGNOSIS — N1832 Chronic kidney disease, stage 3b: Secondary | ICD-10-CM

## 2024-05-18 DIAGNOSIS — E785 Hyperlipidemia, unspecified: Secondary | ICD-10-CM

## 2024-05-18 DIAGNOSIS — E119 Type 2 diabetes mellitus without complications: Secondary | ICD-10-CM

## 2024-05-18 NOTE — Patient Instructions (Signed)
 Medication Instructions:  Your physician recommends that you continue on your current medications as directed. Please refer to the Current Medication list given to you today.   *If you need a refill on your cardiac medications before your next appointment, please call your pharmacy*  Lab Work: Your provider would like for you to return in 1 month to have the following labs drawn: FASTING Lipids and AST/ALT.   Please go to [facility] [address] ([facility] [address], [place] [address] You do not need an appointment.  They are open from 8 am- 4:30 pm.  Lunch from 1:00 pm- 2:00 pm You DO need to be fasting.   You may also go to one of the following [facility]:  [address] [place], [place] [address] Phone: [phone] Lab hours: Mon-Fri 8 am- 5 pm    Lunch 12 pm- 1 pm  [address] [place],  [place]  [address]  [place]  Phone: [phone] Lab hours: 7 am- 4 pm Lunch 12 pm-1 pm   [address] [place],  [place]  [address]  [place]  Phone: [phone] Lab hours: Mon-Fri 8 am- 5 pm    Lunch 12 pm- 1 pm  If you have labs (blood work) drawn today and your tests are completely normal, you will receive your results only by: MyChart Message (if you have MyChart) OR A paper copy in the mail If you have any lab test that is abnormal or we need to change your treatment, we will call you to review the results.  Follow-Up: At [facility], you and your health needs are our priority.  As part of our continuing mission to provide you with exceptional heart care, our providers are all part of one team.  This team includes your primary Cardiologist (physician) and Advanced Practice Providers or APPs (Physician Assistants and Nurse Practitioners) who all work together to provide you with the care you need, when you need it.  Your next appointment:   6 month(s)  Provider:   You may see Redell Cave, MD or Bernardino Bring, PA-C

## 2024-07-02 ENCOUNTER — Ambulatory Visit: Admitting: Family Medicine
